# Patient Record
Sex: Female | Born: 1980 | Race: White | Hispanic: No | Marital: Single | Smoking: Former smoker
Health system: Southern US, Community
[De-identification: ages and names within clinical notes are randomized; demographics above are authoritative.]

## PROBLEM LIST (undated history)

## (undated) ENCOUNTER — Emergency Department (HOSPITAL_BASED_OUTPATIENT_CLINIC_OR_DEPARTMENT_OTHER): Admission: EM | Payer: Self-pay | Source: Home / Self Care

## (undated) DIAGNOSIS — R32 Unspecified urinary incontinence: Secondary | ICD-10-CM

## (undated) DIAGNOSIS — G709 Myoneural disorder, unspecified: Secondary | ICD-10-CM

## (undated) DIAGNOSIS — H469 Unspecified optic neuritis: Secondary | ICD-10-CM

## (undated) DIAGNOSIS — R519 Headache, unspecified: Secondary | ICD-10-CM

## (undated) DIAGNOSIS — R51 Headache: Secondary | ICD-10-CM

## (undated) DIAGNOSIS — Z9889 Other specified postprocedural states: Secondary | ICD-10-CM

## (undated) DIAGNOSIS — J40 Bronchitis, not specified as acute or chronic: Secondary | ICD-10-CM

## (undated) DIAGNOSIS — F329 Major depressive disorder, single episode, unspecified: Secondary | ICD-10-CM

## (undated) DIAGNOSIS — F419 Anxiety disorder, unspecified: Secondary | ICD-10-CM

## (undated) DIAGNOSIS — K219 Gastro-esophageal reflux disease without esophagitis: Secondary | ICD-10-CM

## (undated) DIAGNOSIS — R112 Nausea with vomiting, unspecified: Secondary | ICD-10-CM

## (undated) DIAGNOSIS — E282 Polycystic ovarian syndrome: Secondary | ICD-10-CM

## (undated) DIAGNOSIS — F909 Attention-deficit hyperactivity disorder, unspecified type: Secondary | ICD-10-CM

## (undated) DIAGNOSIS — F32A Depression, unspecified: Secondary | ICD-10-CM

## (undated) HISTORY — PX: PLANTAR FASCIA SURGERY: SHX746

## (undated) HISTORY — PX: HERNIA REPAIR: SHX51

## (undated) HISTORY — PX: ABDOMINAL HYSTERECTOMY: SHX81

## (undated) HISTORY — PX: TUBAL LIGATION: SHX77

---
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
## 2002-02-12 ENCOUNTER — Inpatient Hospital Stay (HOSPITAL_COMMUNITY): Admission: AD | Admit: 2002-02-12 | Discharge: 2002-02-12 | Payer: Self-pay | Admitting: Obstetrics and Gynecology

## 2002-03-20 ENCOUNTER — Inpatient Hospital Stay (HOSPITAL_COMMUNITY): Admission: AD | Admit: 2002-03-20 | Discharge: 2002-03-20 | Payer: Self-pay | Admitting: Obstetrics and Gynecology

## 2002-04-11 ENCOUNTER — Inpatient Hospital Stay (HOSPITAL_COMMUNITY): Admission: AD | Admit: 2002-04-11 | Discharge: 2002-04-11 | Payer: Self-pay | Admitting: Obstetrics and Gynecology

## 2002-04-27 ENCOUNTER — Inpatient Hospital Stay (HOSPITAL_COMMUNITY): Admission: AD | Admit: 2002-04-27 | Discharge: 2002-04-27 | Payer: Self-pay | Admitting: Obstetrics and Gynecology

## 2002-05-06 ENCOUNTER — Inpatient Hospital Stay (HOSPITAL_COMMUNITY): Admission: AD | Admit: 2002-05-06 | Discharge: 2002-05-06 | Payer: Self-pay | Admitting: Obstetrics and Gynecology

## 2002-05-12 ENCOUNTER — Inpatient Hospital Stay (HOSPITAL_COMMUNITY): Admission: AD | Admit: 2002-05-12 | Discharge: 2002-05-12 | Payer: Self-pay | Admitting: Obstetrics & Gynecology

## 2002-05-20 ENCOUNTER — Inpatient Hospital Stay (HOSPITAL_COMMUNITY): Admission: AD | Admit: 2002-05-20 | Discharge: 2002-05-20 | Payer: Self-pay | Admitting: Obstetrics and Gynecology

## 2002-05-25 ENCOUNTER — Inpatient Hospital Stay (HOSPITAL_COMMUNITY): Admission: AD | Admit: 2002-05-25 | Discharge: 2002-05-25 | Payer: Self-pay | Admitting: Obstetrics and Gynecology

## 2002-05-26 ENCOUNTER — Inpatient Hospital Stay (HOSPITAL_COMMUNITY): Admission: AD | Admit: 2002-05-26 | Discharge: 2002-05-30 | Payer: Self-pay | Admitting: Obstetrics and Gynecology

## 2002-06-23 ENCOUNTER — Other Ambulatory Visit: Admission: RE | Admit: 2002-06-23 | Discharge: 2002-06-23 | Payer: Self-pay | Admitting: Obstetrics and Gynecology

## 2002-09-16 ENCOUNTER — Emergency Department (HOSPITAL_COMMUNITY): Admission: EM | Admit: 2002-09-16 | Discharge: 2002-09-16 | Payer: Self-pay | Admitting: Emergency Medicine

## 2002-09-18 ENCOUNTER — Emergency Department (HOSPITAL_COMMUNITY): Admission: EM | Admit: 2002-09-18 | Discharge: 2002-09-18 | Payer: Self-pay | Admitting: Emergency Medicine

## 2003-01-06 ENCOUNTER — Inpatient Hospital Stay (HOSPITAL_COMMUNITY): Admission: AD | Admit: 2003-01-06 | Discharge: 2003-01-06 | Payer: Self-pay | Admitting: *Deleted

## 2003-01-06 ENCOUNTER — Encounter: Payer: Self-pay | Admitting: *Deleted

## 2003-01-27 ENCOUNTER — Encounter: Admission: RE | Admit: 2003-01-27 | Discharge: 2003-01-27 | Payer: Self-pay | Admitting: Obstetrics and Gynecology

## 2003-03-30 ENCOUNTER — Inpatient Hospital Stay (HOSPITAL_COMMUNITY): Admission: EM | Admit: 2003-03-30 | Discharge: 2003-03-31 | Payer: Self-pay

## 2003-03-30 ENCOUNTER — Encounter: Payer: Self-pay | Admitting: Emergency Medicine

## 2003-06-06 ENCOUNTER — Emergency Department (HOSPITAL_COMMUNITY): Admission: EM | Admit: 2003-06-06 | Discharge: 2003-06-06 | Payer: Self-pay | Admitting: Emergency Medicine

## 2003-08-18 ENCOUNTER — Emergency Department (HOSPITAL_COMMUNITY): Admission: EM | Admit: 2003-08-18 | Discharge: 2003-08-18 | Payer: Self-pay | Admitting: Emergency Medicine

## 2003-09-28 ENCOUNTER — Emergency Department (HOSPITAL_COMMUNITY): Admission: EM | Admit: 2003-09-28 | Discharge: 2003-09-28 | Payer: Self-pay | Admitting: Emergency Medicine

## 2003-10-10 ENCOUNTER — Other Ambulatory Visit: Admission: RE | Admit: 2003-10-10 | Discharge: 2003-10-10 | Payer: Self-pay | Admitting: Obstetrics and Gynecology

## 2003-11-17 ENCOUNTER — Inpatient Hospital Stay (HOSPITAL_COMMUNITY): Admission: AD | Admit: 2003-11-17 | Discharge: 2003-11-17 | Payer: Self-pay | Admitting: *Deleted

## 2004-01-16 ENCOUNTER — Inpatient Hospital Stay (HOSPITAL_COMMUNITY): Admission: AD | Admit: 2004-01-16 | Discharge: 2004-01-16 | Payer: Self-pay | Admitting: Obstetrics and Gynecology

## 2004-03-11 ENCOUNTER — Inpatient Hospital Stay (HOSPITAL_COMMUNITY): Admission: AD | Admit: 2004-03-11 | Discharge: 2004-03-12 | Payer: Self-pay | Admitting: Obstetrics and Gynecology

## 2004-03-31 ENCOUNTER — Inpatient Hospital Stay (HOSPITAL_COMMUNITY): Admission: AD | Admit: 2004-03-31 | Discharge: 2004-03-31 | Payer: Self-pay | Admitting: Obstetrics and Gynecology

## 2004-04-02 ENCOUNTER — Inpatient Hospital Stay (HOSPITAL_COMMUNITY): Admission: AD | Admit: 2004-04-02 | Discharge: 2004-04-03 | Payer: Self-pay | Admitting: Obstetrics & Gynecology

## 2004-04-07 ENCOUNTER — Inpatient Hospital Stay (HOSPITAL_COMMUNITY): Admission: AD | Admit: 2004-04-07 | Discharge: 2004-04-07 | Payer: Self-pay | Admitting: Obstetrics & Gynecology

## 2004-05-01 ENCOUNTER — Inpatient Hospital Stay (HOSPITAL_COMMUNITY): Admission: AD | Admit: 2004-05-01 | Discharge: 2004-05-01 | Payer: Self-pay | Admitting: Obstetrics and Gynecology

## 2004-05-06 ENCOUNTER — Inpatient Hospital Stay (HOSPITAL_COMMUNITY): Admission: AD | Admit: 2004-05-06 | Discharge: 2004-05-06 | Payer: Self-pay | Admitting: Obstetrics and Gynecology

## 2004-05-14 ENCOUNTER — Inpatient Hospital Stay (HOSPITAL_COMMUNITY): Admission: AD | Admit: 2004-05-14 | Discharge: 2004-05-14 | Payer: Self-pay | Admitting: Obstetrics and Gynecology

## 2004-05-15 ENCOUNTER — Inpatient Hospital Stay (HOSPITAL_COMMUNITY): Admission: AD | Admit: 2004-05-15 | Discharge: 2004-05-15 | Payer: Self-pay | Admitting: Obstetrics and Gynecology

## 2004-05-16 ENCOUNTER — Inpatient Hospital Stay (HOSPITAL_COMMUNITY): Admission: AD | Admit: 2004-05-16 | Discharge: 2004-05-16 | Payer: Self-pay | Admitting: Obstetrics and Gynecology

## 2004-05-17 ENCOUNTER — Inpatient Hospital Stay (HOSPITAL_COMMUNITY): Admission: AD | Admit: 2004-05-17 | Discharge: 2004-05-17 | Payer: Self-pay | Admitting: Obstetrics & Gynecology

## 2004-05-20 ENCOUNTER — Inpatient Hospital Stay (HOSPITAL_COMMUNITY): Admission: AD | Admit: 2004-05-20 | Discharge: 2004-05-20 | Payer: Self-pay | Admitting: Obstetrics & Gynecology

## 2004-05-28 ENCOUNTER — Inpatient Hospital Stay (HOSPITAL_COMMUNITY): Admission: AD | Admit: 2004-05-28 | Discharge: 2004-05-30 | Payer: Self-pay | Admitting: Obstetrics and Gynecology

## 2004-06-26 ENCOUNTER — Ambulatory Visit (HOSPITAL_COMMUNITY): Admission: RE | Admit: 2004-06-26 | Discharge: 2004-06-26 | Payer: Self-pay | Admitting: Obstetrics and Gynecology

## 2004-12-02 ENCOUNTER — Emergency Department (HOSPITAL_COMMUNITY): Admission: EM | Admit: 2004-12-02 | Discharge: 2004-12-02 | Payer: Self-pay | Admitting: Emergency Medicine

## 2005-02-07 ENCOUNTER — Emergency Department (HOSPITAL_COMMUNITY): Admission: EM | Admit: 2005-02-07 | Discharge: 2005-02-08 | Payer: Self-pay | Admitting: Emergency Medicine

## 2005-06-21 ENCOUNTER — Emergency Department (HOSPITAL_COMMUNITY): Admission: EM | Admit: 2005-06-21 | Discharge: 2005-06-21 | Payer: Self-pay | Admitting: Emergency Medicine

## 2005-07-17 ENCOUNTER — Emergency Department (HOSPITAL_COMMUNITY): Admission: EM | Admit: 2005-07-17 | Discharge: 2005-07-17 | Payer: Self-pay | Admitting: Emergency Medicine

## 2005-09-10 ENCOUNTER — Emergency Department (HOSPITAL_COMMUNITY): Admission: EM | Admit: 2005-09-10 | Discharge: 2005-09-10 | Payer: Self-pay | Admitting: Emergency Medicine

## 2005-09-14 ENCOUNTER — Emergency Department (HOSPITAL_COMMUNITY): Admission: EM | Admit: 2005-09-14 | Discharge: 2005-09-14 | Payer: Self-pay | Admitting: Emergency Medicine

## 2005-10-22 ENCOUNTER — Emergency Department (HOSPITAL_COMMUNITY): Admission: EM | Admit: 2005-10-22 | Discharge: 2005-10-22 | Payer: Self-pay | Admitting: Emergency Medicine

## 2006-04-14 ENCOUNTER — Emergency Department (HOSPITAL_COMMUNITY): Admission: EM | Admit: 2006-04-14 | Discharge: 2006-04-14 | Payer: Self-pay | Admitting: Emergency Medicine

## 2006-08-10 ENCOUNTER — Emergency Department (HOSPITAL_COMMUNITY): Admission: EM | Admit: 2006-08-10 | Discharge: 2006-08-10 | Payer: Self-pay | Admitting: Emergency Medicine

## 2006-09-04 ENCOUNTER — Emergency Department (HOSPITAL_COMMUNITY): Admission: EM | Admit: 2006-09-04 | Discharge: 2006-09-04 | Payer: Self-pay | Admitting: Emergency Medicine

## 2006-09-05 ENCOUNTER — Ambulatory Visit (HOSPITAL_COMMUNITY): Admission: RE | Admit: 2006-09-05 | Discharge: 2006-09-06 | Payer: Self-pay | Admitting: General Surgery

## 2007-05-16 ENCOUNTER — Emergency Department (HOSPITAL_COMMUNITY): Admission: EM | Admit: 2007-05-16 | Discharge: 2007-05-16 | Payer: Self-pay | Admitting: Emergency Medicine

## 2007-08-16 ENCOUNTER — Emergency Department (HOSPITAL_COMMUNITY): Admission: EM | Admit: 2007-08-16 | Discharge: 2007-08-16 | Payer: Self-pay | Admitting: Emergency Medicine

## 2007-09-27 ENCOUNTER — Emergency Department (HOSPITAL_COMMUNITY): Admission: EM | Admit: 2007-09-27 | Discharge: 2007-09-27 | Payer: Self-pay | Admitting: Emergency Medicine

## 2007-10-20 ENCOUNTER — Observation Stay (HOSPITAL_COMMUNITY): Admission: EM | Admit: 2007-10-20 | Discharge: 2007-10-21 | Payer: Self-pay | Admitting: Emergency Medicine

## 2007-10-25 ENCOUNTER — Emergency Department (HOSPITAL_COMMUNITY): Admission: EM | Admit: 2007-10-25 | Discharge: 2007-10-25 | Payer: Self-pay | Admitting: Emergency Medicine

## 2007-11-28 ENCOUNTER — Emergency Department (HOSPITAL_COMMUNITY): Admission: EM | Admit: 2007-11-28 | Discharge: 2007-11-28 | Payer: Self-pay | Admitting: Emergency Medicine

## 2008-01-24 ENCOUNTER — Emergency Department (HOSPITAL_COMMUNITY): Admission: EM | Admit: 2008-01-24 | Discharge: 2008-01-24 | Payer: Self-pay | Admitting: Emergency Medicine

## 2008-03-13 ENCOUNTER — Emergency Department (HOSPITAL_COMMUNITY): Admission: EM | Admit: 2008-03-13 | Discharge: 2008-03-13 | Payer: Self-pay | Admitting: Emergency Medicine

## 2008-03-27 ENCOUNTER — Emergency Department (HOSPITAL_COMMUNITY): Admission: EM | Admit: 2008-03-27 | Discharge: 2008-03-27 | Payer: Self-pay | Admitting: Emergency Medicine

## 2008-04-29 ENCOUNTER — Emergency Department (HOSPITAL_BASED_OUTPATIENT_CLINIC_OR_DEPARTMENT_OTHER): Admission: EM | Admit: 2008-04-29 | Discharge: 2008-04-29 | Payer: Self-pay | Admitting: Emergency Medicine

## 2008-05-29 ENCOUNTER — Emergency Department (HOSPITAL_COMMUNITY): Admission: EM | Admit: 2008-05-29 | Discharge: 2008-05-29 | Payer: Self-pay | Admitting: Emergency Medicine

## 2008-06-22 ENCOUNTER — Ambulatory Visit (HOSPITAL_COMMUNITY): Admission: RE | Admit: 2008-06-22 | Discharge: 2008-06-22 | Payer: Self-pay | Admitting: Physician Assistant

## 2008-06-24 ENCOUNTER — Emergency Department (HOSPITAL_BASED_OUTPATIENT_CLINIC_OR_DEPARTMENT_OTHER): Admission: EM | Admit: 2008-06-24 | Discharge: 2008-06-24 | Payer: Self-pay | Admitting: Emergency Medicine

## 2008-06-29 ENCOUNTER — Inpatient Hospital Stay (HOSPITAL_COMMUNITY): Admission: AD | Admit: 2008-06-29 | Discharge: 2008-06-29 | Payer: Self-pay | Admitting: Obstetrics and Gynecology

## 2008-07-01 ENCOUNTER — Ambulatory Visit: Admission: RE | Admit: 2008-07-01 | Discharge: 2008-07-01 | Payer: Self-pay | Admitting: Obstetrics and Gynecology

## 2008-07-25 ENCOUNTER — Emergency Department (HOSPITAL_BASED_OUTPATIENT_CLINIC_OR_DEPARTMENT_OTHER): Admission: EM | Admit: 2008-07-25 | Discharge: 2008-07-25 | Payer: Self-pay | Admitting: Emergency Medicine

## 2008-08-05 ENCOUNTER — Inpatient Hospital Stay (HOSPITAL_COMMUNITY): Admission: AD | Admit: 2008-08-05 | Discharge: 2008-08-05 | Payer: Self-pay | Admitting: Obstetrics and Gynecology

## 2008-09-24 ENCOUNTER — Emergency Department (HOSPITAL_BASED_OUTPATIENT_CLINIC_OR_DEPARTMENT_OTHER): Admission: EM | Admit: 2008-09-24 | Discharge: 2008-09-24 | Payer: Self-pay | Admitting: Emergency Medicine

## 2008-12-26 ENCOUNTER — Ambulatory Visit: Payer: Self-pay | Admitting: Radiology

## 2008-12-26 ENCOUNTER — Emergency Department (HOSPITAL_BASED_OUTPATIENT_CLINIC_OR_DEPARTMENT_OTHER): Admission: EM | Admit: 2008-12-26 | Discharge: 2008-12-26 | Payer: Self-pay | Admitting: Emergency Medicine

## 2009-01-13 ENCOUNTER — Emergency Department (HOSPITAL_COMMUNITY): Admission: EM | Admit: 2009-01-13 | Discharge: 2009-01-13 | Payer: Self-pay | Admitting: Emergency Medicine

## 2009-01-16 ENCOUNTER — Emergency Department (HOSPITAL_COMMUNITY): Admission: EM | Admit: 2009-01-16 | Discharge: 2009-01-16 | Payer: Self-pay | Admitting: Emergency Medicine

## 2009-03-22 ENCOUNTER — Emergency Department (HOSPITAL_COMMUNITY): Admission: EM | Admit: 2009-03-22 | Discharge: 2009-03-22 | Payer: Self-pay | Admitting: Emergency Medicine

## 2009-04-13 ENCOUNTER — Emergency Department (HOSPITAL_COMMUNITY): Admission: EM | Admit: 2009-04-13 | Discharge: 2009-04-13 | Payer: Self-pay | Admitting: Emergency Medicine

## 2009-05-22 ENCOUNTER — Emergency Department (HOSPITAL_BASED_OUTPATIENT_CLINIC_OR_DEPARTMENT_OTHER): Admission: EM | Admit: 2009-05-22 | Discharge: 2009-05-22 | Payer: Self-pay | Admitting: Emergency Medicine

## 2009-07-18 ENCOUNTER — Emergency Department (HOSPITAL_BASED_OUTPATIENT_CLINIC_OR_DEPARTMENT_OTHER): Admission: EM | Admit: 2009-07-18 | Discharge: 2009-07-18 | Payer: Self-pay | Admitting: Emergency Medicine

## 2009-07-18 ENCOUNTER — Ambulatory Visit: Payer: Self-pay | Admitting: Diagnostic Radiology

## 2009-07-31 ENCOUNTER — Emergency Department (HOSPITAL_BASED_OUTPATIENT_CLINIC_OR_DEPARTMENT_OTHER): Admission: EM | Admit: 2009-07-31 | Discharge: 2009-07-31 | Payer: Self-pay | Admitting: Emergency Medicine

## 2009-09-02 ENCOUNTER — Emergency Department (HOSPITAL_BASED_OUTPATIENT_CLINIC_OR_DEPARTMENT_OTHER): Admission: EM | Admit: 2009-09-02 | Discharge: 2009-09-02 | Payer: Self-pay | Admitting: Emergency Medicine

## 2009-10-25 ENCOUNTER — Emergency Department (HOSPITAL_BASED_OUTPATIENT_CLINIC_OR_DEPARTMENT_OTHER): Admission: EM | Admit: 2009-10-25 | Discharge: 2009-10-25 | Payer: Self-pay | Admitting: Emergency Medicine

## 2009-11-09 ENCOUNTER — Emergency Department (HOSPITAL_BASED_OUTPATIENT_CLINIC_OR_DEPARTMENT_OTHER): Admission: EM | Admit: 2009-11-09 | Discharge: 2009-11-09 | Payer: Self-pay | Admitting: Emergency Medicine

## 2009-11-09 ENCOUNTER — Ambulatory Visit: Payer: Self-pay | Admitting: Radiology

## 2010-04-16 ENCOUNTER — Ambulatory Visit: Payer: Self-pay | Admitting: Interventional Radiology

## 2010-04-16 ENCOUNTER — Emergency Department (HOSPITAL_BASED_OUTPATIENT_CLINIC_OR_DEPARTMENT_OTHER): Admission: EM | Admit: 2010-04-16 | Discharge: 2010-04-16 | Payer: Self-pay | Admitting: Emergency Medicine

## 2010-07-17 ENCOUNTER — Emergency Department (HOSPITAL_COMMUNITY): Admission: EM | Admit: 2010-07-17 | Discharge: 2010-07-17 | Payer: Self-pay | Admitting: Emergency Medicine

## 2010-08-16 ENCOUNTER — Inpatient Hospital Stay (HOSPITAL_COMMUNITY)
Admission: AD | Admit: 2010-08-16 | Discharge: 2010-08-16 | Payer: Self-pay | Source: Home / Self Care | Attending: Obstetrics & Gynecology | Admitting: Obstetrics & Gynecology

## 2010-08-17 ENCOUNTER — Encounter
Admission: RE | Admit: 2010-08-17 | Discharge: 2010-08-17 | Payer: Self-pay | Source: Home / Self Care | Attending: Obstetrics & Gynecology | Admitting: Obstetrics & Gynecology

## 2010-09-03 ENCOUNTER — Emergency Department (HOSPITAL_BASED_OUTPATIENT_CLINIC_OR_DEPARTMENT_OTHER)
Admission: EM | Admit: 2010-09-03 | Discharge: 2010-09-03 | Payer: Self-pay | Source: Home / Self Care | Admitting: Emergency Medicine

## 2010-09-09 ENCOUNTER — Emergency Department (HOSPITAL_BASED_OUTPATIENT_CLINIC_OR_DEPARTMENT_OTHER)
Admission: EM | Admit: 2010-09-09 | Discharge: 2010-09-09 | Payer: Self-pay | Source: Home / Self Care | Admitting: Emergency Medicine

## 2010-09-23 ENCOUNTER — Encounter: Payer: Self-pay | Admitting: General Surgery

## 2010-10-17 ENCOUNTER — Emergency Department (HOSPITAL_BASED_OUTPATIENT_CLINIC_OR_DEPARTMENT_OTHER)
Admission: EM | Admit: 2010-10-17 | Discharge: 2010-10-17 | Disposition: A | Discharge status: Home / Self Care | Payer: Self-pay | Attending: Emergency Medicine | Admitting: Emergency Medicine

## 2010-10-17 DIAGNOSIS — J029 Acute pharyngitis, unspecified: Secondary | ICD-10-CM | POA: Insufficient documentation

## 2010-10-17 DIAGNOSIS — J069 Acute upper respiratory infection, unspecified: Secondary | ICD-10-CM | POA: Insufficient documentation

## 2010-11-16 LAB — URINE MICROSCOPIC-ADD ON

## 2010-11-16 LAB — URINALYSIS, ROUTINE W REFLEX MICROSCOPIC
Bilirubin Urine: NEGATIVE
Glucose, UA: NEGATIVE mg/dL
Hgb urine dipstick: NEGATIVE
Ketones, ur: NEGATIVE mg/dL
Specific Gravity, Urine: 1.022 (ref 1.005–1.030)
pH: 8.5 — ABNORMAL HIGH (ref 5.0–8.0)

## 2010-11-16 LAB — URINE CULTURE: Culture  Setup Time: 201108151959

## 2010-11-21 LAB — URINALYSIS, ROUTINE W REFLEX MICROSCOPIC
Bilirubin Urine: NEGATIVE
Hgb urine dipstick: NEGATIVE
Protein, ur: NEGATIVE mg/dL
Urobilinogen, UA: 0.2 mg/dL (ref 0.0–1.0)

## 2010-11-21 LAB — PREGNANCY, URINE: Preg Test, Ur: NEGATIVE

## 2010-11-21 LAB — URINE MICROSCOPIC-ADD ON

## 2010-12-07 LAB — URINALYSIS, ROUTINE W REFLEX MICROSCOPIC
Glucose, UA: NEGATIVE mg/dL
Ketones, ur: NEGATIVE mg/dL
Specific Gravity, Urine: 1.026 (ref 1.005–1.030)
pH: 8 (ref 5.0–8.0)

## 2010-12-07 LAB — GC/CHLAMYDIA PROBE AMP, GENITAL
Chlamydia, DNA Probe: NEGATIVE
GC Probe Amp, Genital: NEGATIVE

## 2010-12-07 LAB — WET PREP, GENITAL
Trich, Wet Prep: NONE SEEN
Yeast Wet Prep HPF POC: NONE SEEN

## 2010-12-09 LAB — URINE MICROSCOPIC-ADD ON

## 2010-12-09 LAB — URINALYSIS, ROUTINE W REFLEX MICROSCOPIC
Nitrite: NEGATIVE
Specific Gravity, Urine: 1.037 — ABNORMAL HIGH (ref 1.005–1.030)
Urobilinogen, UA: 1 mg/dL (ref 0.0–1.0)
pH: 5.5 (ref 5.0–8.0)

## 2010-12-09 LAB — POCT PREGNANCY, URINE: Preg Test, Ur: NEGATIVE

## 2010-12-11 LAB — COMPREHENSIVE METABOLIC PANEL
ALT: 31 U/L (ref 0–35)
ALT: 25 U/L (ref 0–35)
AST: 27 U/L (ref 0–37)
BUN: 11 mg/dL (ref 6–23)
CO2: 27 mEq/L (ref 19–32)
CO2: 28 mEq/L (ref 19–32)
Calcium: 9 mg/dL (ref 8.4–10.5)
Calcium: 9.5 mg/dL (ref 8.4–10.5)
Chloride: 106 mEq/L (ref 96–112)
Creatinine, Ser: 0.68 mg/dL (ref 0.4–1.2)
Creatinine, Ser: 0.78 mg/dL (ref 0.4–1.2)
GFR calc Af Amer: >60 mL/min (ref >60)
GFR calc non Af Amer: >60 mL/min (ref >60)
GFR calc non Af Amer: >60 mL/min (ref >60)
Glucose, Bld: 95 mg/dL (ref 70–99)
Glucose, Bld: 93 mg/dL (ref 70–99)
Total Bilirubin: 0.6 mg/dL (ref 0.3–1.2)
Total Protein: 7.1 g/dL (ref 6.0–8.3)

## 2010-12-11 LAB — URINALYSIS, ROUTINE W REFLEX MICROSCOPIC
Bilirubin Urine: NEGATIVE
Bilirubin Urine: NEGATIVE
Glucose, UA: NEGATIVE mg/dL
Hgb urine dipstick: NEGATIVE
Ketones, ur: NEGATIVE mg/dL
Ketones, ur: NEGATIVE mg/dL
Nitrite: NEGATIVE
Specific Gravity, Urine: 1.011 (ref 1.005–1.030)
Urobilinogen, UA: 0.2 mg/dL (ref 0.0–1.0)
pH: 6.5 (ref 5.0–8.0)
pH: 5.5 (ref 5.0–8.0)

## 2010-12-11 LAB — CBC
HCT: 41.2 % (ref 36.0–46.0)
Hemoglobin: 12.9 g/dL (ref 12.0–15.0)
Hemoglobin: 14 g/dL (ref 12.0–15.0)
MCHC: 33.9 g/dL (ref 30.0–36.0)
MCHC: 34.1 g/dL (ref 30.0–36.0)
MCV: 85.5 fL (ref 78.0–100.0)
MCV: 86.3 fL (ref 78.0–100.0)
RBC: 4.47 MIL/uL (ref 3.87–5.11)
RBC: 4.77 MIL/uL (ref 3.87–5.11)
RDW: 12.8 % (ref 11.5–15.5)
WBC: 5.8 10*3/uL (ref 4.0–10.5)

## 2010-12-11 LAB — LIPASE, BLOOD
Lipase: 23 U/L (ref 11–59)
Lipase: 20 U/L (ref 11–59)

## 2010-12-11 LAB — DIFFERENTIAL
Basophils Absolute: 0 10*3/uL (ref 0.0–0.1)
Eosinophils Absolute: 0.2 10*3/uL (ref 0.0–0.7)
Eosinophils Absolute: 0.1 10*3/uL (ref 0.0–0.7)
Eosinophils Relative: 4 % (ref 0–5)
Lymphocytes Relative: 32 % (ref 12–46)
Lymphs Abs: 1.8 10*3/uL (ref 0.7–4.0)
Lymphs Abs: 0.9 10*3/uL (ref 0.7–4.0)
Neutro Abs: 6.3 10*3/uL (ref 1.7–7.7)
Neutrophils Relative %: 53 % (ref 43–77)
Neutrophils Relative %: 78 % — ABNORMAL HIGH (ref 43–77)
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
## 2011-01-18 NOTE — Op Note (Signed)
Stacy Deleon, Stacy Deleon              ACCOUNT NO.:  [id number]   MEDICAL RECORD NO.:  [id number]          PATIENT TYPE:  OIB   LOCATION:  2550                         FACILITY:  [facility]   PHYSICIAN:  Sharlet Salina T. Hoxworth, M.D.DATE OF BIRTH:  10-18-1980   DATE OF PROCEDURE:  09/05/2006  DATE OF DISCHARGE:                               OPERATIVE REPORT   PREOPERATIVE DIAGNOSIS:  Supraumbilical hernia.   POSTOPERATIVE DIAGNOSIS:  Supraumbilical hernia.   SURGICAL PROCEDURES:  Repair of supraumbilical hernia with mesh.   SURGEON:  Lorne Skeens. Hoxworth, M.D.   ANESTHESIA:  General.   BRIEF HISTORY:  [name] is a 30 year old female who presents with a  painful bulge just above her umbilicus.  Examination confirms a  partially reducible hernia in this area and CT scan when she with  abdominal pain in the ER has confirmed a hernia with about a 2 cm  fascial defect in the midline above the umbilicus.  Repair using mesh  under general anesthesia has been recommend and accepted.  The nature of  the procedure, indications, risks of bleeding, infection, anesthetic  complications and recurrence have been discussed and understood.  She is  now brought to the operating room for this procedure.   DESCRIPTION OF OPERATION:  The patient was brought to the operating  room, placed in supine position on the operating table, and general  anesthesia was induced.  The abdomen was widely sterilely prepped and  draped.  She received preoperative antibiotics.  Correct patient and  procedure were verified.  An incision was made in the midline just above  the umbilicus over the palpable mass.  Dissection was carried down to  the subcutaneous tissue.  A fairly large hernia sac 4 or 5 cm in  diameter was bluntly dissected from surrounding subcutaneous tissue down  to the level of the fascia.  Following this the fascia was cleared back  for several centimeters in all directions in preparation for repair.  The  defect itself measured about 2 cm.  The sac was opened and contained  no abdominal contents.  There was a lot of retroperitoneal fat  associated with the sac, and the sac was excised between hemostats and  tied with 3-0 Vicryl ties.  The fascial defect was then closed  transversely with several interrupted 0 Prolene sutures.  An onlay mesh  of Parietex was used measuring about 7 x 5 cm and was sutured at its  periphery circumferentially with interrupted 2-0 Prolene.  Following  this the fascia and soft tissue were infiltrated with Marcaine.  Hemostasis was assured.  The subcu was closed with running 3-0 Monocryl  and the skin was closed with running subcuticular 4-0 Monocryl and Steri-  Strips.  Sponge, needle and instrument counts correct.  A dry sterile  dressing was applied and the patient taken to recovery in good  condition.      Lorne Skeens. Hoxworth, M.D.  Electronically Signed    BTH/MEDQ  D:  09/05/2006  T:  09/05/2006  Job:  [id number]
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
## 2011-01-18 NOTE — Discharge Summary (Signed)
NAME:  Stacy Deleon, Stacy Deleon                        ACCOUNT NO.:  [id number]   MEDICAL RECORD NO.:  [id number]                   PATIENT TYPE:  INP   LOCATION:  9147                                 FACILITY:  [facility]   PHYSICIAN:  Gerrit Friends. Aldona Bar, M.D.                DATE OF BIRTH:  1981/05/01   DATE OF ADMISSION:  05/26/2002  DATE OF DISCHARGE:  05/30/2002                                 DISCHARGE SUMMARY   DISCHARGE DIAGNOSES:  1. Term pregnancy delivered, 7 pound 11 ounce female infant, Apgars 9 and 9.  2. Blood type O positive.   PROCEDURES:  Normal spontaneous delivery over intact perineum.   HOSPITAL COURSE:  This is a 30 year old gravida 2, para 1 who presented in  early labor at 37-38 weeks' gestation. She had a normal course of labor with  subsequent normal spontaneous delivery of a viable 7 pound 11 ounce female  infant with good Apgars over an intact perineum. The mother's post partum  course was uncomplicated, however, the baby on the evening of 05/28/02 was  transferred to the neonatal intensive care unit for workup of possible  seizures. According to the mother, this was found to be only resulting from  dehydration; apparently, workup is still in progress, but the baby seems to  be doing well and hopefully will be discharged in 3-5 days. The mother as  mentioned, had a benign post partum course. Her discharge hemoglobin was  10.3 with a white count of 12.6 and a platelet count of 260,000. At the time  of discharge she was attempting to breast feed and store her milk according  to the nursery protocols. She was given all appropriate instructions at the  time of discharge and understood all instructions well.   DISCHARGE MEDICATIONS:  1. Vitamins - one a day.  2. Ferrous sulfate 300 mg daily.  3. She was given a prescription for Motrin 600 mg to use every 6 hours as     needed for discomfort.   DISCHARGE INSTRUCTIONS:  1. She will return to the office for followup in  approximately 4 weeks'     time.  2. She did receive Depo-Provera 150 mg IM for contraception per her request     after informed consent on 05/28/02.   CONDITION ON DISCHARGE:  Improved.                                                Gerrit Friends. Aldona Bar, M.D.    RMW/MEDQ  D:  05/30/2002  T:  05/31/2002  Job:  [phone]

## 2011-01-18 NOTE — Op Note (Signed)
NAMECHARELLE, Stacy Deleon NO.:  [id number]   MEDICAL RECORD NO.:  [id number]          PATIENT TYPE:  AMB   LOCATION:  SDC                           FACILITY:  [facility]   PHYSICIAN:  Carrington Clamp, M.D. DATE OF BIRTH:  31-Jan-1981   DATE OF PROCEDURE:  06/26/2004  DATE OF DISCHARGE:                                 OPERATIVE REPORT   PREOPERATIVE DIAGNOSIS:  Multiparous, desires permanent sterility.   POSTOPERATIVE DIAGNOSIS:  Multiparous, desires permanent sterility.   PROCEDURE:  Filshie clip bilateral tubal ligation with laparoscope.   ATTENDING:  Carrington Clamp, M.D.   ANESTHESIA:  General endotracheal anesthesia.   ESTIMATED BLOOD LOSS:  Minimal.   FLUIDS REPLACED:  1300 mL.   URINE OUTPUT:  Not measured.   COMPLICATIONS:  None.   FINDINGS:  A normal uterus, ovaries, and tubes.   MEDICATIONS:  Marcaine 0.25% to incision.   COUNTS:  Correct x3.   PATHOLOGY:  None.   TECHNIQUE:  After adequate general anesthesia was achieved, the patient was  prepped and draped in the usual sterile fashion in the dorsal lithotomy  position.  A speculum was placed in the vagina and a uterine manipulator  placed into the cervix without complication.  The red rubber catheter was  used to empty the bladder during the procedure.  The speculum was removed.  Attention was then turned to the abdomen, where a 1.5 cm infraumbilical  incision was made transversely.  The Veress needle was passed several times,  and each time there was no aspiration of bowel contents and blood.  The  final time the pressure without rechecking the belly was at 8 mmH2O and the  abdomen was insufflated.  The 10 mm trocar had been passed into the abdomen  after the Veress needle had been introduced.  On putting the scope inside  the belly, inspection of the bowel was undertaken and found to be normal.  The tubes were identified and each tube was grasped with the Filshie clip  and then the Filshie clip  placed around the entirety of the tube.  Pictures  were taken to indicate the Filshie clip was around the entirety of the tube  on both sides.  All instruments were then withdrawn and the abdomen  desufflated.  The trocar was removed and the 10 mm incision  was then closed with a figure-of-eight stitch of 2-0 Vicryl.  The skin was  closed with Dermabond.  All instruments were withdrawn from the vagina, and  the patient tolerated the procedure well, was returned to the recovery room  in stable condition.      MH/MEDQ  D:  06/26/2004  T:  06/26/2004  Job:  [id number]

## 2011-03-15 ENCOUNTER — Emergency Department (HOSPITAL_BASED_OUTPATIENT_CLINIC_OR_DEPARTMENT_OTHER)
Admission: EM | Admit: 2011-03-15 | Discharge: 2011-03-15 | Disposition: A | Discharge status: Home / Self Care | Payer: Medicaid Other | Attending: Emergency Medicine | Admitting: Emergency Medicine

## 2011-03-15 ENCOUNTER — Encounter: Payer: Self-pay | Admitting: *Deleted

## 2011-03-15 DIAGNOSIS — H6690 Otitis media, unspecified, unspecified ear: Secondary | ICD-10-CM | POA: Insufficient documentation

## 2011-03-15 DIAGNOSIS — J029 Acute pharyngitis, unspecified: Secondary | ICD-10-CM | POA: Insufficient documentation

## 2011-03-15 DIAGNOSIS — H9209 Otalgia, unspecified ear: Secondary | ICD-10-CM | POA: Insufficient documentation

## 2011-03-15 LAB — RAPID STREP SCREEN (MED CTR MEBANE ONLY): Streptococcus, Group A Screen (Direct): POSITIVE — AB

## 2011-03-15 MED ORDER — AMOXICILLIN 500 MG PO CAPS: 500.0000 mg | ORAL_CAPSULE | Freq: Three times a day (TID) | ORAL | Status: AC

## 2011-03-15 NOTE — ED Provider Notes (Signed)
History     Chief Complaint  Patient presents with  . Sore Throat   HPI Comments: Pt has a 3 day history of sore throat, and has a history of strep throat,  And also has left ear pain.   Patient is a 30 y.o. female presenting with pharyngitis.  Sore Throat This is a new problem. The current episode started more than 2 days ago. The problem occurs constantly. The problem has not changed since onset.The symptoms are aggravated by nothing. The symptoms are relieved by nothing. She has tried nothing for the symptoms.    History reviewed. No pertinent past medical history.  History reviewed. No pertinent past surgical history.  History reviewed. No pertinent family history.  History  Substance Use Topics  . Smoking status: Never Smoker   . Smokeless tobacco: Not on file  . Alcohol Use: No    OB History    Grav Para Term Preterm Abortions TAB SAB Ect Mult Living                  Review of Systems  Constitutional: Positive for chills. Negative for fever.  HENT: Positive for sore throat.        Left earache.  Respiratory: Negative.   Cardiovascular: Negative.   Gastrointestinal: Negative.   Genitourinary: Negative.   Musculoskeletal: Negative.   Skin: Negative.   Neurological: Negative.   Psychiatric/Behavioral: Negative.     Physical Exam  BP 104/65  Pulse 88  Temp(Src) 98.1 F (36.7 C) (Oral)  Resp 18  Ht 5\' 5"  (1.651 m)  Wt 218 lb (98.884 kg)  BMI 36.28 kg/m2  SpO2 98%  Physical Exam  Constitutional: She is oriented to person, place, and time. She appears well-developed and well-nourished.  HENT:  Head: Normocephalic and atraumatic. Not macrocephalic.  Right Ear: Tympanic membrane is not injected.  Left Ear: Tympanic membrane is injected.  Nose: Nose normal.  Mouth/Throat: Uvula is midline. Posterior oropharyngeal erythema present. No tonsillar abscesses.  Eyes: Conjunctivae and EOM are normal. Pupils are equal, round, and reactive to light.    Cardiovascular: Normal rate, regular rhythm and normal heart sounds.   Pulmonary/Chest: Effort normal and breath sounds normal.  Abdominal: Soft. Bowel sounds are normal.  Musculoskeletal: Normal range of motion.  Lymphadenopathy:    She has no cervical adenopathy.  Neurological: She is alert and oriented to person, place, and time.  Skin: Skin is warm and dry.  Psychiatric: She has a normal mood and affect. Her behavior is normal.    ED Course  Procedures  MDM   Pt was treated with amoxicillin 500 mg tid x 10 days for left otitis media.  Rx for metformin to treat her PCOS was written.      Carleene Cooper III, MD 03/17/11 [phone]

## 2011-03-15 NOTE — ED Notes (Signed)
Pt reports sore throat with fevers x Tuesday, has 3 small children at home.  Pt states she frequently get strep, step test obtained while pt being triaged, sent to lab for testing.

## 2011-05-20 ENCOUNTER — Other Ambulatory Visit: Payer: Self-pay

## 2011-05-20 ENCOUNTER — Emergency Department (HOSPITAL_BASED_OUTPATIENT_CLINIC_OR_DEPARTMENT_OTHER)
Admission: EM | Admit: 2011-05-20 | Discharge: 2011-05-20 | Disposition: A | Discharge status: Home / Self Care | Payer: Medicaid Other | Attending: Emergency Medicine | Admitting: Emergency Medicine

## 2011-05-20 ENCOUNTER — Emergency Department (INDEPENDENT_AMBULATORY_CARE_PROVIDER_SITE_OTHER): Payer: Medicaid Other

## 2011-05-20 ENCOUNTER — Encounter (HOSPITAL_BASED_OUTPATIENT_CLINIC_OR_DEPARTMENT_OTHER): Payer: Self-pay | Admitting: Family Medicine

## 2011-05-20 DIAGNOSIS — R0789 Other chest pain: Secondary | ICD-10-CM

## 2011-05-20 DIAGNOSIS — J40 Bronchitis, not specified as acute or chronic: Secondary | ICD-10-CM | POA: Insufficient documentation

## 2011-05-20 DIAGNOSIS — R0602 Shortness of breath: Secondary | ICD-10-CM | POA: Insufficient documentation

## 2011-05-20 DIAGNOSIS — R05 Cough: Secondary | ICD-10-CM

## 2011-05-20 HISTORY — DX: Bronchitis, not specified as acute or chronic: J40

## 2011-05-20 HISTORY — DX: Polycystic ovarian syndrome: E28.2

## 2011-05-20 MED ORDER — ALBUTEROL SULFATE HFA 108 (90 BASE) MCG/ACT IN AERS: 2.0000 | INHALATION_SPRAY | RESPIRATORY_TRACT | Status: DC | PRN

## 2011-05-20 MED ORDER — AZITHROMYCIN 250 MG PO TABS: 250.0000 mg | ORAL_TABLET | Freq: Every day | ORAL | Status: AC

## 2011-05-20 NOTE — ED Notes (Signed)
Pt c/o shortness of breath, tightness in chest and back and "a lot of drainage in my throat". Pt sts "I usually get bronchitis every year". Pt able to speak in full sentences, pulse ox 100%.

## 2011-05-20 NOTE — ED Notes (Signed)
Reports cough as dry and sometimes wet and productive.

## 2011-05-20 NOTE — ED Provider Notes (Addendum)
History     CSN: [id number] Arrival date & time: 05/20/2011  5:00 PM Scribed for Glynn Octave, MD, the patient was seen in room MH10/MH10. This chart was scribed by Katha Cabal. This patient's care was started at 5:00PM.     Chief Complaint  Patient presents with  . Shortness of Breath    HPI   Stacy Deleon is a 30 y.o. female who presents to the Emergency Department complaining of gradual worsening of intermittent SOB with associated productive cough, rhinorrhea, post nasal drip, chest tightness, upper back pain and vomiting (x1) that began 3 days ago.  Patient states that she wheezes while resting and that her SOB is aggravated by lying flat.  Denies fever, abdominal pain, rash, recent sick contacts, and facial pain.  Patient has a hx of Bronchitis.  Patient is not currently being treated for PCOS.  Denies having DM.   Patient adds that her son has CF.   No PCP     PAST MEDICAL HISTORY:  Past Medical History  Diagnosis Date  . Bronchitis   . Polycystic ovary syndrome     PAST SURGICAL HISTORY:  Past Surgical History  Procedure Date  . Tubal ligation     FAMILY HISTORY:  History reviewed. No pertinent family history.   SOCIAL HISTORY: History   Social History  . Marital Status: Single    Spouse Name: N/A    Number of Children: N/A  . Years of Education: N/A   Social History Main Topics  . Smoking status: Never Smoker   . Smokeless tobacco: None  . Alcohol Use: No  . Drug Use: No  . Sexually Active:    Other Topics Concern  . None   Social History Narrative  . None    Review of Systems A 10 review of systems reviewed and are negative for acute change except as noted in the HPI.  Allergies  Hydrocodone  Home Medications   Current Outpatient Rx  Name Route Sig Dispense Refill  . METFORMIN HCL 500 MG PO TABS Oral Take 500 mg by mouth 2 (two) times daily with a meal.     . OMEGA-3 FATTY ACIDS 1000 MG PO CAPS Oral Take 2 g by mouth daily. Hold  while in hospital     . MULTIVITAMINS PO TABS Oral Take 1 tablet by mouth daily.        Physical Exam    BP 108/61  Pulse 66  Temp(Src) 97.8 F (36.6 C) (Oral)  Resp 16  Ht 5\' 5"  (1.651 m)  Wt 236 lb (107.049 kg)  BMI 39.27 kg/m2  SpO2 100%  LMP 04/12/2011  Physical Exam  Nursing note and vitals reviewed. Constitutional: She is oriented to person, place, and time. She appears well-developed and well-nourished. No distress.  HENT:  Head: Normocephalic and atraumatic.  Right Ear: External ear normal.  Left Ear: External ear normal.  Mouth/Throat: Oropharynx is clear and moist.  Eyes: Pupils are equal, round, and reactive to light.  Neck: Neck supple.  Cardiovascular: Normal rate, regular rhythm and normal heart sounds.   No murmur heard. Pulmonary/Chest: Effort normal and breath sounds normal. No respiratory distress. She has no wheezes. She has no rales.  Abdominal: Soft. There is no tenderness. There is no rebound and no guarding.  Musculoskeletal: Normal range of motion.  Lymphadenopathy:    She has no cervical adenopathy.  Neurological: She is alert and oriented to person, place, and time.  Skin: Skin is warm and dry. No  rash noted.  Psychiatric: She has a normal mood and affect. Her behavior is normal.    ED Course  Procedures    OTHER DATA REVIEWED: Nursing notes, vital signs, and past medical records reviewed.    DIAGNOSTIC STUDIES: Oxygen Saturation is 100% on room air, normal by my interpretation.     Date: 05/20/2011  Rate: 71  Rhythm: normal sinus rhythm  QRS Axis: normal  Intervals: normal  ST/T Wave abnormalities: normal  Conduction Disutrbances:none  Narrative Interpretation:   Old EKG Reviewed: unchanged  Dg Chest 2 View  05/20/2011  *RADIOLOGY REPORT*  Clinical Data: Shortness of breath with cough and chest tightness.  CHEST - 2 VIEW  Comparison: 09/09/2010  Findings: The lungs are clear without focal consolidation, edema, effusion or  pneumothorax.  Cardiopericardial silhouette is within normal limits for size.  Imaged bony structures of the thorax are intact.  IMPRESSION: Normal exam.  Original Report Authenticated By: ERIC A. MANSELL, M.D.     ED COURSE / COORDINATION OF CARE:  Orders Placed This Encounter  Procedures  . DG Chest 2 View  . ED EKG    MDM: Chest tightness, rhinorrhea, postnasal drip. No fever no chest pain, difficulty breathing but does have dry cough. Former smoker. No focality on lung exam. PERC negative, not on birth control.   IMPRESSION: Diagnoses that have been ruled out:  Diagnoses that are still under consideration:  Final diagnoses:     MEDICATIONS GIVEN IN THE E.D. Scheduled Meds:   Continuous Infusions:      DISCHARGE MEDICATIONS: New Prescriptions   No medications on file  I personally performed the services described in this documentation, which was scribed in my presence.  The recorded information has been reviewed and considered.          Glynn Octave, MD 05/20/11 1743  Glynn Octave, MD 05/31/11 4401

## 2011-05-24 LAB — CBC
HCT: 37.3
Hemoglobin: 13.7
MCHC: 34.6
MCHC: 34.7
Platelets: 299
Platelets: 330
RDW: 12.6
RDW: 12.7
RDW: 12.9

## 2011-05-24 LAB — BASIC METABOLIC PANEL
Chloride: 97
GFR calc Af Amer: >60
GFR calc non Af Amer: >60
Potassium: 3.7

## 2011-05-24 LAB — COMPREHENSIVE METABOLIC PANEL
ALT: 31
AST: 23
Albumin: 3.9
Alkaline Phosphatase: 124 — ABNORMAL HIGH
Calcium: 9.4
GFR calc Af Amer: >60
Glucose, Bld: 99
Potassium: 4.5
Sodium: 137
Total Protein: 7.2

## 2011-05-24 LAB — DIFFERENTIAL
Basophils Absolute: 0.1
Basophils Relative: 2 — ABNORMAL HIGH
Eosinophils Absolute: 0.2
Eosinophils Relative: 3
Lymphocytes Relative: 24
Lymphs Abs: 2.2
Monocytes Absolute: 0.6
Monocytes Relative: 9
Neutro Abs: 8 — ABNORMAL HIGH
Neutrophils Relative %: 66

## 2011-05-24 LAB — URINE MICROSCOPIC-ADD ON

## 2011-05-24 LAB — URINALYSIS, ROUTINE W REFLEX MICROSCOPIC
Glucose, UA: NEGATIVE
Nitrite: NEGATIVE
Protein, ur: NEGATIVE
pH: 6

## 2011-05-27 LAB — URINALYSIS, ROUTINE W REFLEX MICROSCOPIC
Glucose, UA: NEGATIVE
Ketones, ur: NEGATIVE
Protein, ur: NEGATIVE
pH: 5.5

## 2011-05-27 LAB — URINE MICROSCOPIC-ADD ON

## 2011-05-29 LAB — RAPID STREP SCREEN (MED CTR MEBANE ONLY): Streptococcus, Group A Screen (Direct): POSITIVE — AB

## 2011-05-31 LAB — BASIC METABOLIC PANEL
BUN: 11
Calcium: 9.5
GFR calc non Af Amer: >60
Glucose, Bld: 96
Potassium: 4.2
Sodium: 138

## 2011-05-31 LAB — DIFFERENTIAL
Basophils Absolute: 0
Lymphocytes Relative: 37
Lymphs Abs: 2.4
Neutro Abs: 3.3

## 2011-05-31 LAB — D-DIMER, QUANTITATIVE: D-Dimer, Quant: 0.62 — ABNORMAL HIGH

## 2011-05-31 LAB — CBC
HCT: 41.1
Hemoglobin: 13.9
Platelets: 310
RDW: 12.9
WBC: 6.4

## 2011-05-31 LAB — POCT CARDIAC MARKERS
CKMB, poc: <1 — ABNORMAL LOW
Myoglobin, poc: 66.2
Myoglobin, poc: 70.3
Operator id: 244461
Troponin i, poc: <0.05

## 2011-06-03 LAB — URINALYSIS, ROUTINE W REFLEX MICROSCOPIC
Bilirubin Urine: NEGATIVE
Glucose, UA: NEGATIVE
Ketones, ur: NEGATIVE
Nitrite: NEGATIVE
pH: 5.5

## 2011-06-03 LAB — URINE MICROSCOPIC-ADD ON

## 2011-06-03 LAB — POCT PREGNANCY, URINE: Preg Test, Ur: NEGATIVE

## 2011-06-04 LAB — PREGNANCY, URINE: Preg Test, Ur: NEGATIVE

## 2011-06-04 LAB — DIFFERENTIAL
Eosinophils Relative: 3
Lymphocytes Relative: 24
Lymphs Abs: 2.5
Monocytes Absolute: 1

## 2011-06-04 LAB — URINALYSIS, ROUTINE W REFLEX MICROSCOPIC
Bilirubin Urine: NEGATIVE
Ketones, ur: NEGATIVE
Nitrite: NEGATIVE
Protein, ur: NEGATIVE
Specific Gravity, Urine: 1.026
Urobilinogen, UA: 0.2
pH: 5.5

## 2011-06-04 LAB — URINE CULTURE: Colony Count: >=100000

## 2011-06-04 LAB — URINE MICROSCOPIC-ADD ON

## 2011-06-04 LAB — BASIC METABOLIC PANEL
GFR calc Af Amer: >60
GFR calc non Af Amer: >60
Potassium: 3.8
Sodium: 138

## 2011-06-04 LAB — CBC
HCT: 37.6
Hemoglobin: 13.1
WBC: 10.7 — ABNORMAL HIGH

## 2011-06-06 LAB — CBC
HCT: 38.3 % (ref 36.0–46.0)
Hemoglobin: 13.1 g/dL (ref 12.0–15.0)
MCHC: 34.3 g/dL (ref 30.0–36.0)
MCV: 84.4 fL (ref 78.0–100.0)
Platelets: 323 10*3/uL (ref 150–400)
RDW: 12.3 % (ref 11.5–15.5)

## 2011-06-06 LAB — URINALYSIS, ROUTINE W REFLEX MICROSCOPIC
Bilirubin Urine: NEGATIVE
Glucose, UA: NEGATIVE mg/dL
Hgb urine dipstick: NEGATIVE
Ketones, ur: NEGATIVE mg/dL
Protein, ur: NEGATIVE mg/dL
Urobilinogen, UA: 0.2 mg/dL (ref 0.0–1.0)

## 2011-06-21 ENCOUNTER — Emergency Department (HOSPITAL_COMMUNITY)
Admission: EM | Admit: 2011-06-21 | Discharge: 2011-06-22 | Disposition: A | Discharge status: Home / Self Care | Payer: Medicaid Other | Attending: Emergency Medicine | Admitting: Emergency Medicine

## 2011-06-21 DIAGNOSIS — F329 Major depressive disorder, single episode, unspecified: Secondary | ICD-10-CM | POA: Insufficient documentation

## 2011-06-21 DIAGNOSIS — F3289 Other specified depressive episodes: Secondary | ICD-10-CM | POA: Insufficient documentation

## 2011-06-21 DIAGNOSIS — R1011 Right upper quadrant pain: Secondary | ICD-10-CM | POA: Insufficient documentation

## 2011-06-21 DIAGNOSIS — E282 Polycystic ovarian syndrome: Secondary | ICD-10-CM | POA: Insufficient documentation

## 2011-06-21 DIAGNOSIS — K8020 Calculus of gallbladder without cholecystitis without obstruction: Secondary | ICD-10-CM | POA: Insufficient documentation

## 2011-06-21 LAB — POCT I-STAT, CHEM 8
BUN: 13 mg/dL (ref 6–23)
Calcium, Ion: 1.15 mmol/L (ref 1.12–1.32)
Chloride: 100 mEq/L (ref 96–112)
Creatinine, Ser: 1 mg/dL (ref 0.50–1.10)
Glucose, Bld: 96 mg/dL (ref 70–99)
HCT: 39 % (ref 36.0–46.0)
Hemoglobin: 13.3 g/dL (ref 12.0–15.0)
Potassium: 3.8 mEq/L (ref 3.5–5.1)
Sodium: 138 mEq/L (ref 135–145)
TCO2: 27 mmol/L (ref 0–100)

## 2011-06-21 LAB — COMPREHENSIVE METABOLIC PANEL
ALT: 27 U/L (ref 0–35)
AST: 20 U/L (ref 0–37)
Albumin: 3.9 g/dL (ref 3.5–5.2)
Calcium: 9.5 mg/dL (ref 8.4–10.5)
Creatinine, Ser: 0.8 mg/dL (ref 0.50–1.10)
Sodium: 137 mEq/L (ref 135–145)

## 2011-06-21 LAB — CBC
MCH: 28 pg (ref 26.0–34.0)
MCV: 84.7 fL (ref 78.0–100.0)
Platelets: 354 10*3/uL (ref 150–400)
RBC: 4.64 MIL/uL (ref 3.87–5.11)
RDW: 12.5 % (ref 11.5–15.5)
WBC: 8.7 10*3/uL (ref 4.0–10.5)

## 2011-06-21 LAB — URINE MICROSCOPIC-ADD ON

## 2011-06-21 LAB — DIFFERENTIAL
Basophils Relative: 1 % (ref 0–1)
Eosinophils Absolute: 0.3 10*3/uL (ref 0.0–0.7)
Eosinophils Relative: 3 % (ref 0–5)
Lymphs Abs: 3.4 10*3/uL (ref 0.7–4.0)
Monocytes Relative: 10 % (ref 3–12)
Neutrophils Relative %: 47 % (ref 43–77)

## 2011-06-21 LAB — URINALYSIS, ROUTINE W REFLEX MICROSCOPIC
Glucose, UA: NEGATIVE mg/dL
Hgb urine dipstick: NEGATIVE
Specific Gravity, Urine: 1.026 (ref 1.005–1.030)
pH: 5.5 (ref 5.0–8.0)

## 2011-06-22 ENCOUNTER — Emergency Department (HOSPITAL_COMMUNITY): Payer: Medicaid Other

## 2011-06-26 ENCOUNTER — Other Ambulatory Visit (HOSPITAL_COMMUNITY): Payer: Self-pay | Admitting: Family Medicine

## 2011-06-26 ENCOUNTER — Encounter (HOSPITAL_COMMUNITY)
Admission: RE | Admit: 2011-06-26 | Discharge: 2011-06-26 | Disposition: A | Discharge status: Home / Self Care | Payer: Medicaid Other | Source: Ambulatory Visit | Attending: Family Medicine | Admitting: Family Medicine

## 2011-06-26 DIAGNOSIS — R1084 Generalized abdominal pain: Secondary | ICD-10-CM

## 2011-06-26 DIAGNOSIS — R112 Nausea with vomiting, unspecified: Secondary | ICD-10-CM

## 2011-06-26 DIAGNOSIS — R109 Unspecified abdominal pain: Secondary | ICD-10-CM | POA: Insufficient documentation

## 2011-06-26 MED ORDER — TECHNETIUM TC 99M MEBROFENIN IV KIT
5.5000 | PACK | Freq: Once | INTRAVENOUS | Status: AC | PRN
  Administered 2011-06-26: 6 via INTRAVENOUS

## 2011-07-12 ENCOUNTER — Encounter (INDEPENDENT_AMBULATORY_CARE_PROVIDER_SITE_OTHER): Payer: Self-pay | Admitting: General Surgery

## 2011-08-22 ENCOUNTER — Encounter (HOSPITAL_BASED_OUTPATIENT_CLINIC_OR_DEPARTMENT_OTHER): Payer: Self-pay | Admitting: *Deleted

## 2011-08-22 ENCOUNTER — Emergency Department (HOSPITAL_BASED_OUTPATIENT_CLINIC_OR_DEPARTMENT_OTHER)
Admission: EM | Admit: 2011-08-22 | Discharge: 2011-08-23 | Disposition: A | Discharge status: Home / Self Care | Payer: Medicaid Other | Attending: Emergency Medicine | Admitting: Emergency Medicine

## 2011-08-22 DIAGNOSIS — H9202 Otalgia, left ear: Secondary | ICD-10-CM

## 2011-08-22 DIAGNOSIS — H9209 Otalgia, unspecified ear: Secondary | ICD-10-CM | POA: Insufficient documentation

## 2011-08-22 DIAGNOSIS — F341 Dysthymic disorder: Secondary | ICD-10-CM | POA: Insufficient documentation

## 2011-08-22 DIAGNOSIS — Z79899 Other long term (current) drug therapy: Secondary | ICD-10-CM | POA: Insufficient documentation

## 2011-08-22 HISTORY — DX: Major depressive disorder, single episode, unspecified: F32.9

## 2011-08-22 HISTORY — DX: Depression, unspecified: F32.A

## 2011-08-22 HISTORY — DX: Anxiety disorder, unspecified: F41.9

## 2011-08-22 NOTE — ED Notes (Signed)
Left ear pain for a week, was placed on amoxicillin appx a week ago. Tonight she felt a pain in her left ear that she describes as feeling as though it ruptured. Now having drainage that had previously resolved.

## 2011-08-23 MED ORDER — NAPROXEN 500 MG PO TABS: 500.0000 mg | ORAL_TABLET | Freq: Two times a day (BID) | ORAL | Status: DC

## 2011-08-23 MED ORDER — KETOROLAC TROMETHAMINE 60 MG/2ML IM SOLN
60.0000 mg | Freq: Once | INTRAMUSCULAR | Status: AC
  Administered 2011-08-23: 60 mg via INTRAMUSCULAR
  Filled 2011-08-23: qty 2

## 2011-08-23 MED ORDER — CIPROFLOXACIN-DEXAMETHASONE 0.3-0.1 % OT SUSP
4.0000 [drp] | Freq: Once | OTIC | Status: AC
  Administered 2011-08-23: 4 [drp] via OTIC
  Filled 2011-08-23: qty 7.5

## 2011-08-23 NOTE — ED Provider Notes (Signed)
History     CSN: [id number]  Arrival date & time 08/22/11  2310   First MD Initiated Contact with Patient 08/23/11 0004      Chief Complaint  Patient presents with  . Otalgia    (Consider location/radiation/quality/duration/timing/severity/associated sxs/prior treatment) HPI Comments: 30 year old female with a history of left ear inflammation for the last 10 days. She has been seen prior to her visit here today by her family doctor and treated with oral amoxicillin for sinusitis and left ear pain. She states that her sinus symptoms and headaches have gone away but she has persistent left ear pain. At approximately 10:15 PM this evening she felt acute onset of left ear pain. This occurred while she was yawning. It seems now to be persistent, worse with talking, yawning and moving her head. It is not associated with fevers, vomiting, coughing or shortness of breath. She states that the pain is severe, worse with manipulation of the left ear, and persistent. She has been seen by an ear nose and throat specialist Dr. Marisa Sprinkles in the past.  Patient is a 30 y.o. female presenting with ear pain. The history is provided by the patient.  Otalgia Pertinent negatives include no headaches, no diarrhea, no vomiting, no cough and no rash.    Past Medical History  Diagnosis Date  . Bronchitis   . Polycystic ovary syndrome   . Anxiety   . Depression     Past Surgical History  Procedure Date  . Tubal ligation   . Hernia repair     No family history on file.  History  Substance Use Topics  . Smoking status: Never Smoker   . Smokeless tobacco: Not on file  . Alcohol Use: No    OB History    Grav Para Term Preterm Abortions TAB SAB Ect Mult Living                  Review of Systems  Constitutional: Negative for fever.  HENT: Positive for ear pain. Negative for sinus pressure.   Respiratory: Negative for cough, chest tightness and shortness of breath.   Cardiovascular: Negative for  chest pain.  Gastrointestinal: Negative for nausea, vomiting and diarrhea.  Musculoskeletal: Negative for back pain.  Skin: Negative for rash.  Neurological: Negative for headaches.    Allergies  Hydrocodone  Home Medications   Current Outpatient Rx  Name Route Sig Dispense Refill  . OMEPRAZOLE 10 MG PO CPDR Oral Take 10 mg by mouth daily.      . SERTRALINE HCL 100 MG PO TABS Oral Take 100 mg by mouth daily.      . ALBUTEROL SULFATE HFA 108 (90 BASE) MCG/ACT IN AERS Inhalation Inhale 2 puffs into the lungs every 4 (four) hours as needed for wheezing. 1 Inhaler 0  . CINNAMON 500 MG PO CAPS Oral Take 500 mg by mouth daily.      . OMEGA-3 FATTY ACIDS 1000 MG PO CAPS Oral Take 2 g by mouth daily. Hold while in hospital     . METFORMIN HCL 500 MG PO TABS Oral Take 500 mg by mouth 2 (two) times daily with a meal.     . MULTIVITAMINS PO TABS Oral Take 1 tablet by mouth daily.      Marland Kitchen NAPROXEN 500 MG PO TABS Oral Take 1 tablet (500 mg total) by mouth 2 (two) times daily with a meal. 30 tablet 0    BP 136/76  Pulse 87  Temp 98.3 F (36.8 C)  Resp 18  SpO2 99%  Physical Exam  Nursing note and vitals reviewed. Constitutional: She appears well-developed and well-nourished. No distress.  HENT:  Head: Normocephalic and atraumatic.  Mouth/Throat: Oropharynx is clear and moist. No oropharyngeal exudate.       Nares clear, right tympanic membrane normal, right external canal normal, left tympanic membrane with erythema but no purulent drainage, bulging or loss of landmarks. Left auricle tender to manipulation, pain with manipulation of the tragus. No swelling or discharge or discoloration or desquamation of the left external auditory canal  Eyes: Conjunctivae and EOM are normal. Pupils are equal, round, and reactive to light. Right eye exhibits no discharge. Left eye exhibits no discharge. No scleral icterus.  Neck: Normal range of motion. Neck supple. No JVD present. No thyromegaly present.    Cardiovascular: Normal rate, regular rhythm, normal heart sounds and intact distal pulses.  Exam reveals no gallop and no friction rub.   No murmur heard. Pulmonary/Chest: Effort normal and breath sounds normal. No respiratory distress. She has no wheezes. She has no rales.  Musculoskeletal: Normal range of motion. She exhibits no edema and no tenderness.  Lymphadenopathy:    She has no cervical adenopathy.  Neurological: She is alert. Coordination normal.  Skin: Skin is warm and dry. No rash noted. No erythema.  Psychiatric: She has a normal mood and affect. Her behavior is normal.    ED Course  Procedures (including critical care time)  Labs Reviewed - No data to display No results found.   1. Otalgia of left ear       MDM  Patient has possible early otitis externa, there is no discharge, perforation of the tympanic membrane. She is able to open and close her mouth without pain, doubt temporomandibular joint syndrome, she declines pain medications other than Toradol and Ciprodex ear drops. She wants to followup with her doctor tomorrow and has ear nose and throat doctor followup as needed.  I see no evidence of perforation of TM at this time        Vida Roller, MD 08/23/11 [phone]

## 2011-09-05 DIAGNOSIS — E669 Obesity, unspecified: Secondary | ICD-10-CM | POA: Insufficient documentation

## 2011-09-19 ENCOUNTER — Emergency Department (INDEPENDENT_AMBULATORY_CARE_PROVIDER_SITE_OTHER): Payer: Medicaid Other

## 2011-09-19 ENCOUNTER — Encounter (HOSPITAL_BASED_OUTPATIENT_CLINIC_OR_DEPARTMENT_OTHER): Payer: Self-pay | Admitting: *Deleted

## 2011-09-19 ENCOUNTER — Emergency Department (HOSPITAL_BASED_OUTPATIENT_CLINIC_OR_DEPARTMENT_OTHER)
Admission: EM | Admit: 2011-09-19 | Discharge: 2011-09-19 | Disposition: A | Discharge status: Home / Self Care | Payer: Medicaid Other | Attending: Emergency Medicine | Admitting: Emergency Medicine

## 2011-09-19 DIAGNOSIS — R0602 Shortness of breath: Secondary | ICD-10-CM

## 2011-09-19 DIAGNOSIS — R0989 Other specified symptoms and signs involving the circulatory and respiratory systems: Secondary | ICD-10-CM

## 2011-09-19 DIAGNOSIS — J069 Acute upper respiratory infection, unspecified: Secondary | ICD-10-CM | POA: Insufficient documentation

## 2011-09-19 DIAGNOSIS — R05 Cough: Secondary | ICD-10-CM

## 2011-09-19 DIAGNOSIS — J111 Influenza due to unidentified influenza virus with other respiratory manifestations: Secondary | ICD-10-CM

## 2011-09-19 DIAGNOSIS — R51 Headache: Secondary | ICD-10-CM | POA: Insufficient documentation

## 2011-09-19 DIAGNOSIS — J3489 Other specified disorders of nose and nasal sinuses: Secondary | ICD-10-CM | POA: Insufficient documentation

## 2011-09-19 DIAGNOSIS — F341 Dysthymic disorder: Secondary | ICD-10-CM | POA: Insufficient documentation

## 2011-09-19 MED ORDER — IBUPROFEN 800 MG PO TABS: 800.0000 mg | ORAL_TABLET | Freq: Three times a day (TID) | ORAL | Status: AC

## 2011-09-19 MED ORDER — ONDANSETRON 8 MG PO TBDP
8.0000 mg | ORAL_TABLET | Freq: Once | ORAL | Status: AC
  Administered 2011-09-19: 8 mg via ORAL
  Filled 2011-09-19: qty 1

## 2011-09-19 MED ORDER — ONDANSETRON 8 MG PO TBDP: 8.0000 mg | ORAL_TABLET | Freq: Three times a day (TID) | ORAL | Status: AC | PRN

## 2011-09-19 MED ORDER — OSELTAMIVIR PHOSPHATE 75 MG PO CAPS: 75.0000 mg | ORAL_CAPSULE | Freq: Two times a day (BID) | ORAL | Status: AC

## 2011-09-19 MED ORDER — DEXAMETHASONE SODIUM PHOSPHATE 10 MG/ML IJ SOLN
10.0000 mg | Freq: Once | INTRAMUSCULAR | Status: AC
  Administered 2011-09-19: 10 mg via INTRAMUSCULAR
  Filled 2011-09-19: qty 1

## 2011-09-19 MED ORDER — FLUTICASONE PROPIONATE 50 MCG/ACT NA SUSP: 2.0000 | Freq: Every day | NASAL | Status: DC

## 2011-09-19 MED ORDER — KETOROLAC TROMETHAMINE 60 MG/2ML IM SOLN
60.0000 mg | Freq: Once | INTRAMUSCULAR | Status: AC
  Administered 2011-09-19: 60 mg via INTRAMUSCULAR
  Filled 2011-09-19: qty 2

## 2011-09-19 NOTE — ED Notes (Signed)
Cold symptoms for a few days. Non productive cough. Chills. This am she felt sob and had chest heaviness.

## 2011-09-19 NOTE — ED Provider Notes (Signed)
History     CSN: [id number]  Arrival date & time 09/19/11  1234   First MD Initiated Contact with Patient 09/19/11 1241    12:50 PM HPI Stacy Deleon is a 31 y.o. female c/o URI for 2 days. Sxs include congestion, sore throat, SOB, cough, chest congestion, chills and nausea. Denies fever, neck pain, abdominal pain, urinary symptoms.  Patient is a 31 y.o. female presenting with URI. The history is provided by the patient.  URI The primary symptoms include headaches, ear pain, sore throat, cough, nausea and myalgias. Primary symptoms do not include fever, abdominal pain, vomiting or rash. The current episode started 2 days ago. This is a new problem. The problem has been gradually worsening.  The headache began 2 days ago. The headache developed gradually. The headache is not associated with photophobia, double vision, eye pain, stiff neck or weakness.  The ear pain began 2 days ago. Ear pain is a new problem. The ear pain has been unchanged since its onset. The right ear is affected. The pain is mild.    The cough began 2 days ago. The cough is new. The cough is non-productive.  The nausea is exacerbated by food.  The myalgias are not associated with weakness.  Symptoms associated with the illness include chills, sinus pressure, congestion and rhinorrhea. The following treatments were addressed: A decongestant was ineffective. NSAIDs were ineffective.    Past Medical History  Diagnosis Date  . Bronchitis   . Polycystic ovary syndrome   . Anxiety   . Depression     Past Surgical History  Procedure Date  . Tubal ligation   . Hernia repair     No family history on file.  History  Substance Use Topics  . Smoking status: Never Smoker   . Smokeless tobacco: Not on file  . Alcohol Use: No    OB History    Grav Para Term Preterm Abortions TAB SAB Ect Mult Living                  Review of Systems  Constitutional: Positive for chills. Negative for fever.  HENT: Positive  for ear pain, congestion, sore throat, rhinorrhea and sinus pressure. Negative for neck pain and ear discharge.   Eyes: Negative for double vision, photophobia and pain.  Respiratory: Positive for cough and shortness of breath.   Cardiovascular: Negative for chest pain.  Gastrointestinal: Positive for nausea. Negative for vomiting and abdominal pain.  Genitourinary: Negative for dysuria, urgency and frequency.  Musculoskeletal: Positive for myalgias. Negative for back pain.  Skin: Negative for rash.  Neurological: Positive for headaches. Negative for dizziness, weakness, light-headedness and numbness.  All other systems reviewed and are negative.    Allergies  Hydrocodone  Home Medications   Current Outpatient Rx  Name Route Sig Dispense Refill  . ALBUTEROL SULFATE HFA 108 (90 BASE) MCG/ACT IN AERS Inhalation Inhale 2 puffs into the lungs every 4 (four) hours as needed for wheezing. 1 Inhaler 0  . CINNAMON 500 MG PO CAPS Oral Take 500 mg by mouth daily.      . OMEGA-3 FATTY ACIDS 1000 MG PO CAPS Oral Take 2 g by mouth daily. Hold while in hospital     . METFORMIN HCL 500 MG PO TABS Oral Take 500 mg by mouth 2 (two) times daily with a meal.     . MULTIVITAMINS PO TABS Oral Take 1 tablet by mouth daily.      Marland Kitchen NAPROXEN 500 MG PO  TABS Oral Take 1 tablet (500 mg total) by mouth 2 (two) times daily with a meal. 30 tablet 0  . OMEPRAZOLE 10 MG PO CPDR Oral Take 10 mg by mouth daily.      . SERTRALINE HCL 100 MG PO TABS Oral Take 100 mg by mouth daily.        There were no vitals taken for this visit.  Physical Exam  Vitals reviewed. Constitutional: She is oriented to person, place, and time. Vital signs are normal. She appears well-developed and well-nourished.  HENT:  Head: Normocephalic and atraumatic.  Right Ear: Hearing, tympanic membrane, external ear and ear canal normal.  Left Ear: Hearing, tympanic membrane, external ear and ear canal normal.  Nose: Rhinorrhea present.    Mouth/Throat: Uvula is midline, oropharynx is clear and moist and mucous membranes are normal.  Eyes: Conjunctivae are normal. Pupils are equal, round, and reactive to light.  Neck: Normal range of motion. Neck supple.  Cardiovascular: Normal rate, regular rhythm and normal heart sounds.  Exam reveals no friction rub.   No murmur heard. Pulmonary/Chest: Effort normal and breath sounds normal. She has no wheezes. She has no rhonchi. She has no rales. She exhibits no tenderness.  Musculoskeletal: Normal range of motion.  Neurological: She is alert and oriented to person, place, and time. Coordination normal.  Skin: Skin is warm and dry. No rash noted. No erythema. No pallor.    ED Course  Procedures   Dg Chest 2 View  09/19/2011  *RADIOLOGY REPORT*  Clinical Data: Shortness of breath, cough, congestion  CHEST - 2 VIEW  Comparison: May 20, 2011  Findings: The cardiac silhouette, mediastinum, pulmonary vasculature are within normal limits.  Both lungs are clear. There is no acute bony abnormality.  IMPRESSION: There is no evidence of acute cardiac or pulmonary process.  Original Report Authenticated By: Brandon Melnick, M.D.   MDM   Patient reports improvement of her symptoms. Will discharge patient with influenza-like illness. Advised she can take Tamiflu but likely this is a viral infection. Advised symptomatic treatment. Since recent plan and is ready for discharge     Thomasene Lot, PA-C 09/19/11 1407

## 2011-09-20 NOTE — ED Provider Notes (Signed)
Medical screening examination/treatment/procedure(s) were performed by non-physician practitioner and as supervising physician I was immediately available for consultation/collaboration.  Rodrick Payson, MD 09/20/11 0723 

## 2011-11-11 ENCOUNTER — Encounter (HOSPITAL_BASED_OUTPATIENT_CLINIC_OR_DEPARTMENT_OTHER): Payer: Self-pay

## 2011-11-11 ENCOUNTER — Emergency Department (INDEPENDENT_AMBULATORY_CARE_PROVIDER_SITE_OTHER): Payer: Medicaid Other

## 2011-11-11 ENCOUNTER — Emergency Department (HOSPITAL_BASED_OUTPATIENT_CLINIC_OR_DEPARTMENT_OTHER)
Admission: EM | Admit: 2011-11-11 | Discharge: 2011-11-11 | Disposition: A | Discharge status: Home / Self Care | Payer: Medicaid Other | Attending: Emergency Medicine | Admitting: Emergency Medicine

## 2011-11-11 DIAGNOSIS — F329 Major depressive disorder, single episode, unspecified: Secondary | ICD-10-CM | POA: Insufficient documentation

## 2011-11-11 DIAGNOSIS — M79609 Pain in unspecified limb: Secondary | ICD-10-CM

## 2011-11-11 DIAGNOSIS — M722 Plantar fascial fibromatosis: Secondary | ICD-10-CM

## 2011-11-11 DIAGNOSIS — E282 Polycystic ovarian syndrome: Secondary | ICD-10-CM | POA: Insufficient documentation

## 2011-11-11 DIAGNOSIS — F411 Generalized anxiety disorder: Secondary | ICD-10-CM | POA: Insufficient documentation

## 2011-11-11 DIAGNOSIS — F3289 Other specified depressive episodes: Secondary | ICD-10-CM | POA: Insufficient documentation

## 2011-11-11 MED ORDER — OXYCODONE-ACETAMINOPHEN 5-325 MG PO TABS: 1.0000 | ORAL_TABLET | Freq: Four times a day (QID) | ORAL | Status: AC | PRN

## 2011-11-11 MED ORDER — NAPROXEN 500 MG PO TABS: 500.0000 mg | ORAL_TABLET | Freq: Two times a day (BID) | ORAL | Status: DC

## 2011-11-11 NOTE — Discharge Instructions (Signed)

## 2011-11-11 NOTE — ED Notes (Signed)
C/o foot pain x 6 months-denies injury

## 2011-11-11 NOTE — ED Provider Notes (Signed)
History     CSN: [id number]  Arrival date & time 11/11/11  4098   First MD Initiated Contact with Patient 11/11/11 2038      Chief Complaint  Patient presents with  . Foot Pain    (Consider location/radiation/quality/duration/timing/severity/associated sxs/prior treatment) Patient is a 31 y.o. female presenting with lower extremity pain. The history is provided by the patient. No language interpreter was used.  Foot Pain This is a recurrent problem. The current episode started more than 1 month ago. The problem occurs 2 to 4 times per day. The problem has been gradually worsening. Pertinent negatives include no numbness or weakness. The symptoms are aggravated by standing. She has tried rest for the symptoms. The treatment provided no relief.  Patient states pain is primarily in bottom of foot, worse upon awaking in AM and after prolonged sitting.  No calf pain.  Past Medical History  Diagnosis Date  . Bronchitis   . Polycystic ovary syndrome   . Anxiety   . Depression     Past Surgical History  Procedure Date  . Tubal ligation   . Hernia repair     No family history on file.  History  Substance Use Topics  . Smoking status: Never Smoker   . Smokeless tobacco: Not on file  . Alcohol Use: No    OB History    Grav Para Term Preterm Abortions TAB SAB Ect Mult Living                  Review of Systems  Musculoskeletal: Positive for gait problem.  Neurological: Negative for weakness and numbness.  All other systems reviewed and are negative.    Allergies  Hydrocodone  Home Medications   Current Outpatient Rx  Name Route Sig Dispense Refill  . ACETAMINOPHEN 325 MG PO TABS Oral Take 650 mg by mouth every 6 (six) hours as needed. Patient uses this medication for foot pain.    Marland Kitchen METFORMIN HCL 500 MG PO TABS Oral Take 500 mg by mouth 2 (two) times daily with a meal.     . MULTIVITAMINS PO TABS Oral Take 1 tablet by mouth daily.      Marland Kitchen OMEPRAZOLE 10 MG PO CPDR  Oral Take 10 mg by mouth daily.      . ALBUTEROL SULFATE HFA 108 (90 BASE) MCG/ACT IN AERS Inhalation Inhale 2 puffs into the lungs every 4 (four) hours as needed for wheezing. 1 Inhaler 0  . CINNAMON 500 MG PO CAPS Oral Take 500 mg by mouth daily.      . OMEGA-3 FATTY ACIDS 1000 MG PO CAPS Oral Take 2 g by mouth daily. Hold while in hospital     . FLUTICASONE PROPIONATE 50 MCG/ACT NA SUSP Nasal Place 2 sprays into the nose daily. 16 g 2    BP 126/83  Pulse 90  Temp(Src) 98.2 F (36.8 C) (Oral)  Resp 16  Ht 5\' 6"  (1.676 m)  Wt 217 lb (98.431 kg)  BMI 35.02 kg/m2  SpO2 100%  LMP 11/06/2011  Physical Exam  Nursing note and vitals reviewed. Constitutional: She is oriented to person, place, and time. She appears well-developed and well-nourished.  HENT:  Head: Normocephalic.  Eyes: Pupils are equal, round, and reactive to light.  Neck: Normal range of motion. Neck supple.  Cardiovascular: Normal rate, regular rhythm, normal heart sounds and intact distal pulses.   Pulmonary/Chest: Effort normal and breath sounds normal.  Abdominal: Soft. Bowel sounds are normal.  Musculoskeletal: Normal  range of motion. She exhibits tenderness.  Neurological: She is alert and oriented to person, place, and time.  Skin: Skin is warm and dry.  Psychiatric: She has a normal mood and affect. Her behavior is normal. Judgment and thought content normal.    ED Course  Procedures (including critical care time)  Labs Reviewed - No data to display No results found.  Normal plain film of foot. No diagnosis found.  Plantar faciitis  MDM          Jimmye Norman, NP 11/11/11 2112

## 2011-11-12 NOTE — ED Provider Notes (Signed)
Medical screening examination/treatment/procedure(s) were performed by non-physician practitioner and as supervising physician I was immediately available for consultation/collaboration.   Joya Gaskins, MD 11/12/11 [phone]

## 2011-12-07 ENCOUNTER — Emergency Department (INDEPENDENT_AMBULATORY_CARE_PROVIDER_SITE_OTHER): Payer: Medicaid Other

## 2011-12-07 ENCOUNTER — Emergency Department (HOSPITAL_BASED_OUTPATIENT_CLINIC_OR_DEPARTMENT_OTHER)
Admission: EM | Admit: 2011-12-07 | Discharge: 2011-12-07 | Disposition: A | Discharge status: Home / Self Care | Payer: Medicaid Other | Attending: Emergency Medicine | Admitting: Emergency Medicine

## 2011-12-07 ENCOUNTER — Encounter (HOSPITAL_BASED_OUTPATIENT_CLINIC_OR_DEPARTMENT_OTHER): Payer: Self-pay | Admitting: *Deleted

## 2011-12-07 DIAGNOSIS — X58XXXA Exposure to other specified factors, initial encounter: Secondary | ICD-10-CM

## 2011-12-07 DIAGNOSIS — W19XXXA Unspecified fall, initial encounter: Secondary | ICD-10-CM | POA: Insufficient documentation

## 2011-12-07 DIAGNOSIS — M79609 Pain in unspecified limb: Secondary | ICD-10-CM

## 2011-12-07 DIAGNOSIS — S43409A Unspecified sprain of unspecified shoulder joint, initial encounter: Secondary | ICD-10-CM

## 2011-12-07 DIAGNOSIS — S63509A Unspecified sprain of unspecified wrist, initial encounter: Secondary | ICD-10-CM

## 2011-12-07 DIAGNOSIS — IMO0002 Reserved for concepts with insufficient information to code with codable children: Secondary | ICD-10-CM | POA: Insufficient documentation

## 2011-12-07 MED ORDER — OXYCODONE-ACETAMINOPHEN 5-325 MG PO TABS
1.0000 | ORAL_TABLET | Freq: Once | ORAL | Status: AC
  Administered 2011-12-07: 1 via ORAL
  Filled 2011-12-07: qty 1

## 2011-12-07 MED ORDER — OXYCODONE-ACETAMINOPHEN 5-325 MG PO TABS: 1.0000 | ORAL_TABLET | ORAL | Status: AC | PRN

## 2011-12-07 MED ORDER — NAPROXEN 500 MG PO TABS: 500.0000 mg | ORAL_TABLET | Freq: Two times a day (BID) | ORAL | Status: DC

## 2011-12-07 NOTE — ED Notes (Signed)
Pt states she was walking the dog and fell injuring her left hand. Throbbing from fingertips to elbow and pain to shoulder. +radial pulse palp. Unable to move fingers at this time.

## 2011-12-07 NOTE — ED Provider Notes (Signed)
History     CSN: [id number]  Arrival date & time 12/07/11  1349   First MD Initiated Contact with Patient 12/07/11 1425      Chief Complaint  Patient presents with  . Hand Pain    (Consider location/radiation/quality/duration/timing/severity/associated sxs/prior treatment) HPI Pt fell this afternoon onto her left hand while walking the dog.  The pain is primarily in her hand.  It hurts up toward her elbow.  She also has some trouble in her left shoulder where she fell.  No LOC or head injury.  NO chest pain or shortness of breath.  NO abdominal pain. Past Medical History  Diagnosis Date  . Bronchitis   . Polycystic ovary syndrome   . Anxiety   . Depression     Past Surgical History  Procedure Date  . Tubal ligation   . Hernia repair     History reviewed. No pertinent family history.  History  Substance Use Topics  . Smoking status: Never Smoker   . Smokeless tobacco: Not on file  . Alcohol Use: No    OB History    Grav Para Term Preterm Abortions TAB SAB Ect Mult Living                  Review of Systems  All other systems reviewed and are negative.    Allergies  Hydrocodone  Home Medications   Current Outpatient Rx  Name Route Sig Dispense Refill  . ACETAMINOPHEN 325 MG PO TABS Oral Take 650 mg by mouth every 6 (six) hours as needed. Patient uses this medication for foot pain.    . ALBUTEROL SULFATE HFA 108 (90 BASE) MCG/ACT IN AERS Inhalation Inhale 2 puffs into the lungs every 4 (four) hours as needed for wheezing. 1 Inhaler 0  . CINNAMON 500 MG PO CAPS Oral Take 500 mg by mouth daily.      . OMEGA-3 FATTY ACIDS 1000 MG PO CAPS Oral Take 2 g by mouth daily. Hold while in hospital     . FLUTICASONE PROPIONATE 50 MCG/ACT NA SUSP Nasal Place 2 sprays into the nose daily. 16 g 2  . METFORMIN HCL 500 MG PO TABS Oral Take 500 mg by mouth 2 (two) times daily with a meal.     . MULTIVITAMINS PO TABS Oral Take 1 tablet by mouth daily.      Marland Kitchen NAPROXEN 500 MG  PO TABS Oral Take 1 tablet (500 mg total) by mouth 2 (two) times daily. 30 tablet 0  . OMEPRAZOLE 10 MG PO CPDR Oral Take 10 mg by mouth daily.        BP 129/80  Pulse 99  Temp(Src) 98.5 F (36.9 C) (Oral)  Resp 20  Ht 5\' 6"  (1.676 m)  Wt 210 lb (95.255 kg)  BMI 33.89 kg/m2  SpO2 98%  LMP 12/07/2011  Physical Exam  Nursing note and vitals reviewed. Constitutional: She appears well-developed and well-nourished. No distress.  HENT:  Head: Normocephalic and atraumatic.  Right Ear: External ear normal.  Left Ear: External ear normal.  Eyes: Conjunctivae are normal. Right eye exhibits no discharge. Left eye exhibits no discharge. No scleral icterus.  Neck: Neck supple. No tracheal deviation present.  Cardiovascular: Normal rate.   Pulmonary/Chest: Effort normal. No stridor. No respiratory distress.  Musculoskeletal: She exhibits no edema.       Left shoulder: She exhibits tenderness.       Left elbow: She exhibits no swelling, no effusion and no deformity. tenderness found.  Left wrist: She exhibits decreased range of motion, tenderness and bony tenderness. She exhibits no swelling and no effusion.  Neurological: She is alert. Cranial nerve deficit: no gross deficits.  Skin: Skin is warm and dry. No rash noted.  Psychiatric: She has a normal mood and affect.    ED Course  Procedures (including critical care time)  Labs Reviewed - No data to display Dg Forearm Left  12/07/2011  *RADIOLOGY REPORT*  Clinical Data: Pain post injury  LEFT FOREARM - 2 VIEW  Comparison: None.  Findings: Two views of the left forearm submitted.  No acute fracture or subluxation.  No posterior fat pad sign.  IMPRESSION: No acute fracture or subluxation.  Original Report Authenticated By: Natasha Mead, M.D.   Dg Wrist Complete Left  12/07/2011  *RADIOLOGY REPORT*  Clinical Data: Hand pain post injury  LEFT WRIST - COMPLETE 3+ VIEW  Comparison: None.  Findings: Four views of the left wrist submitted.  No  acute fracture or subluxation.  No radiopaque foreign body.  IMPRESSION: No acute fracture or subluxation.  Original Report Authenticated By: Natasha Mead, M.D.   Dg Shoulder Left  12/07/2011  *RADIOLOGY REPORT*  Clinical Data: Pain post injury  LEFT SHOULDER - 2+ VIEW  Comparison: None.  Findings: Four views of the left shoulder submitted.  No acute fracture or subluxation.  No radiopaque foreign body.  IMPRESSION: No acute fracture or subluxation.  Original Report Authenticated By: Natasha Mead, M.D.   Dg Hand Complete Left  12/07/2011  *RADIOLOGY REPORT*  Clinical Data: Pain post injury  LEFT HAND - COMPLETE 3+ VIEW  Comparison: None.  Findings: Three views of the left hand submitted.  No acute fracture or subluxation.  No radiopaque foreign body.  IMPRESSION: No acute fracture or subluxation.  Original Report Authenticated By: Natasha Mead, M.D.     MDM  The patient appears to have a sprain of her wrist and upper extremity. There does not appear to be evidence of fracture or dislocation. She was placed in a splint and immobilizer for comfort.        Celene Kras, MD 12/07/11 [phone]

## 2011-12-07 NOTE — Discharge Instructions (Signed)
Ligament Sprain  Ligaments are tough, fibrous tissues that hold bones together at the joints. A sprain can occur when a ligament is stretched. This injury may take several weeks to heal.  HOME CARE INSTRUCTIONS    Rest the injured area for as long as directed by your caregiver. Then slowly start using the joint as directed by your caregiver and as the pain allows.   Keep the affected joint raised if possible to lessen swelling.   Apply ice for 15 to 20 minutes to the injured area every couple hours for the first half day, then 3 to 4 times per day for the first 48 hours. Put the ice in a plastic bag and place a towel between the bag of ice and your skin.   Wear any splinting, casting, or elastic bandage applications as instructed.   Only take over-the-counter or prescription medicines for pain, discomfort, or fever as directed by your caregiver. Do not use aspirin immediately after the injury unless instructed by your caregiver. Aspirin can cause increased bleeding and bruising of the tissues.   If you were given crutches, continue to use them as instructed and do not resume weight bearing on the affected extremity until instructed.  SEEK MEDICAL CARE IF:    Your bruising, swelling, or pain increases.   You have cold and numb fingers or toes if your arm or leg was injured.  SEEK IMMEDIATE MEDICAL CARE IF:    Your toes are numb or blue if your leg was injured.   Your fingers are numb or blue if your arm was injured.   Your pain is not responding to medicines and continues to stay the same or gets worse.  MAKE SURE YOU:    Understand these instructions.   Will watch your condition.   Will get help right away if you are not doing well or get worse.  Document Released: 08/16/2000 Document Revised: 08/08/2011 Document Reviewed: 06/14/2008  [employer] Patient Information 2012 [employer].

## 2012-03-31 DIAGNOSIS — E282 Polycystic ovarian syndrome: Secondary | ICD-10-CM | POA: Insufficient documentation

## 2012-04-06 DIAGNOSIS — Z87891 Personal history of nicotine dependence: Secondary | ICD-10-CM | POA: Insufficient documentation

## 2012-04-06 DIAGNOSIS — F341 Dysthymic disorder: Secondary | ICD-10-CM | POA: Insufficient documentation

## 2012-06-29 ENCOUNTER — Emergency Department (HOSPITAL_BASED_OUTPATIENT_CLINIC_OR_DEPARTMENT_OTHER)
Admission: EM | Admit: 2012-06-29 | Discharge: 2012-06-29 | Disposition: A | Discharge status: Home / Self Care | Payer: 59 | Attending: Emergency Medicine | Admitting: Emergency Medicine

## 2012-06-29 ENCOUNTER — Encounter (HOSPITAL_BASED_OUTPATIENT_CLINIC_OR_DEPARTMENT_OTHER): Payer: Self-pay | Admitting: Family Medicine

## 2012-06-29 DIAGNOSIS — M79673 Pain in unspecified foot: Secondary | ICD-10-CM

## 2012-06-29 DIAGNOSIS — F3289 Other specified depressive episodes: Secondary | ICD-10-CM | POA: Insufficient documentation

## 2012-06-29 DIAGNOSIS — F329 Major depressive disorder, single episode, unspecified: Secondary | ICD-10-CM | POA: Insufficient documentation

## 2012-06-29 DIAGNOSIS — F411 Generalized anxiety disorder: Secondary | ICD-10-CM | POA: Insufficient documentation

## 2012-06-29 DIAGNOSIS — M79609 Pain in unspecified limb: Secondary | ICD-10-CM | POA: Insufficient documentation

## 2012-06-29 DIAGNOSIS — Z862 Personal history of diseases of the blood and blood-forming organs and certain disorders involving the immune mechanism: Secondary | ICD-10-CM | POA: Insufficient documentation

## 2012-06-29 DIAGNOSIS — Z79899 Other long term (current) drug therapy: Secondary | ICD-10-CM | POA: Insufficient documentation

## 2012-06-29 DIAGNOSIS — E282 Polycystic ovarian syndrome: Secondary | ICD-10-CM | POA: Insufficient documentation

## 2012-06-29 DIAGNOSIS — Z8739 Personal history of other diseases of the musculoskeletal system and connective tissue: Secondary | ICD-10-CM | POA: Insufficient documentation

## 2012-06-29 DIAGNOSIS — Z8639 Personal history of other endocrine, nutritional and metabolic disease: Secondary | ICD-10-CM | POA: Insufficient documentation

## 2012-06-29 DIAGNOSIS — Z8709 Personal history of other diseases of the respiratory system: Secondary | ICD-10-CM | POA: Insufficient documentation

## 2012-06-29 MED ORDER — OXYCODONE-ACETAMINOPHEN 5-325 MG PO TABS: 1.0000 | ORAL_TABLET | Freq: Four times a day (QID) | ORAL | Status: DC | PRN

## 2012-06-29 NOTE — ED Provider Notes (Addendum)
History     CSN: [id number]  Arrival date & time 06/29/12  1050   First MD Initiated Contact with Patient 06/29/12 1204      Chief Complaint  Patient presents with  . Foot Pain    (Consider location/radiation/quality/duration/timing/severity/associated sxs/prior treatment) Patient is a 31 y.o. female presenting with lower extremity pain. The history is provided by the patient.  Foot Pain This is a recurrent problem. Episode onset: Worsening over the last 3-4 days. The problem occurs constantly. The problem has been gradually worsening. Associated symptoms comments: Pain in the left heel that radiates up into the ankle. The pain is a 9/10 and worse with walking. The symptoms are aggravated by walking and standing. The symptoms are relieved by rest. She has tried acetaminophen for the symptoms. The treatment provided no relief.    Past Medical History  Diagnosis Date  . Bronchitis   . Polycystic ovary syndrome   . Anxiety   . Depression     Past Surgical History  Procedure Date  . Tubal ligation   . Hernia repair   . Plantar fascia surgery     No family history on file.  History  Substance Use Topics  . Smoking status: Never Smoker   . Smokeless tobacco: Not on file  . Alcohol Use: No    OB History    Grav Para Term Preterm Abortions TAB SAB Ect Mult Living                  Review of Systems  All other systems reviewed and are negative.    Allergies  Hydrocodone  Home Medications   Current Outpatient Rx  Name Route Sig Dispense Refill  . ACETAMINOPHEN 325 MG PO TABS Oral Take 650 mg by mouth every 6 (six) hours as needed. Patient uses this medication for foot pain.    . ALBUTEROL SULFATE HFA 108 (90 BASE) MCG/ACT IN AERS Inhalation Inhale 2 puffs into the lungs every 4 (four) hours as needed for wheezing. 1 Inhaler 0  . CINNAMON 500 MG PO CAPS Oral Take 500 mg by mouth daily.      . OMEGA-3 FATTY ACIDS 1000 MG PO CAPS Oral Take 2 g by mouth daily. Hold  while in hospital     . FLUTICASONE PROPIONATE 50 MCG/ACT NA SUSP Nasal Place 2 sprays into the nose daily. 16 g 2  . METFORMIN HCL 500 MG PO TABS Oral Take 500 mg by mouth 2 (two) times daily with a meal.     . MULTIVITAMINS PO TABS Oral Take 1 tablet by mouth daily.      Marland Kitchen NAPROXEN 500 MG PO TABS Oral Take 1 tablet (500 mg total) by mouth 2 (two) times daily. 30 tablet 0  . OMEPRAZOLE 10 MG PO CPDR Oral Take 10 mg by mouth daily.        BP 125/76  Pulse 60  Temp 98.3 F (36.8 C) (Oral)  Resp 18  Ht 5\' 6"  (1.676 m)  Wt 193 lb (87.544 kg)  BMI 31.15 kg/m2  SpO2 100%  Physical Exam  Nursing note and vitals reviewed. Constitutional: She is oriented to person, place, and time. She appears well-developed and well-nourished. No distress.  HENT:  Head: Normocephalic and atraumatic.  Musculoskeletal: She exhibits tenderness.       Left foot: She exhibits tenderness and swelling. She exhibits no crepitus and no deformity.       Feet:  Neurological: She is alert and oriented to person,  place, and time.  Skin: Skin is warm and dry. No rash noted. No erythema.  Psychiatric: She has a normal mood and affect. Her behavior is normal.    ED Course  Procedures (including critical care time)  Labs Reviewed - No data to display No results found.   1. Foot pain       MDM   Patient with a history of plantar fasciitis who has been under the care of a podiatrist and had a surgery done in September for the plantar fasciitis however does not improved. She has an appointment to see the podiatrist on Thursday but states the pain is so bad today she cannot walk. She states she used to have a walking boot but when she was cleared she threw it away. Patient is also not allowed to take any anti-inflammatories 9 weeks post surgery. Tylenol is not improving her symptoms. Patient was placed in a Cam Walker and given Percocet. She has no signs concerning for infection at this time        Gwyneth Sprout, MD 06/29/12 1230  Gwyneth Sprout, MD 06/29/12 1232

## 2012-06-29 NOTE — ED Notes (Signed)
Pt c/o left heel pain for "weeks" and reports having surgery for "plantar fascitis" in Sept. Pt sts she has appt to see podiatrist on Thursday but pain is too great to wait until then.

## 2012-06-29 NOTE — ED Notes (Signed)
Patient reports surgery on left foot on 05/11/12.  Patient reports increased pain and unable to see her doctor today.

## 2012-07-06 ENCOUNTER — Inpatient Hospital Stay (HOSPITAL_COMMUNITY): Payer: 59

## 2012-07-06 ENCOUNTER — Encounter (HOSPITAL_COMMUNITY): Payer: Self-pay | Admitting: *Deleted

## 2012-07-06 ENCOUNTER — Inpatient Hospital Stay (HOSPITAL_COMMUNITY)
Admission: AD | Admit: 2012-07-06 | Discharge: 2012-07-06 | Disposition: A | Discharge status: Hospice | Payer: 59 | Source: Ambulatory Visit | Attending: Obstetrics & Gynecology | Admitting: Obstetrics & Gynecology

## 2012-07-06 DIAGNOSIS — N938 Other specified abnormal uterine and vaginal bleeding: Secondary | ICD-10-CM

## 2012-07-06 DIAGNOSIS — R109 Unspecified abdominal pain: Secondary | ICD-10-CM | POA: Insufficient documentation

## 2012-07-06 DIAGNOSIS — N949 Unspecified condition associated with female genital organs and menstrual cycle: Secondary | ICD-10-CM | POA: Insufficient documentation

## 2012-07-06 LAB — URINALYSIS, ROUTINE W REFLEX MICROSCOPIC
Glucose, UA: NEGATIVE mg/dL
Leukocytes, UA: NEGATIVE
Nitrite: NEGATIVE
Protein, ur: NEGATIVE mg/dL

## 2012-07-06 LAB — WET PREP, GENITAL
Clue Cells Wet Prep HPF POC: NONE SEEN
Yeast Wet Prep HPF POC: NONE SEEN

## 2012-07-06 LAB — CBC
HCT: 38.5 % (ref 36.0–46.0)
Hemoglobin: 12.7 g/dL (ref 12.0–15.0)
WBC: 9.8 10*3/uL (ref 4.0–10.5)

## 2012-07-06 LAB — URINE MICROSCOPIC-ADD ON

## 2012-07-06 LAB — POCT PREGNANCY, URINE: Preg Test, Ur: NEGATIVE

## 2012-07-06 MED ORDER — NORGESTIMATE-ETH ESTRADIOL 0.25-35 MG-MCG PO TABS: 1.0000 | ORAL_TABLET | Freq: Every day | ORAL | Status: DC

## 2012-07-06 MED ORDER — TRAMADOL HCL 50 MG PO TABS: 50.0000 mg | ORAL_TABLET | Freq: Four times a day (QID) | ORAL | Status: DC | PRN

## 2012-07-06 MED ORDER — KETOROLAC TROMETHAMINE 60 MG/2ML IM SOLN
60.0000 mg | Freq: Once | INTRAMUSCULAR | Status: AC
  Administered 2012-07-06: 60 mg via INTRAMUSCULAR
  Filled 2012-07-06: qty 2

## 2012-07-06 NOTE — MAU Provider Note (Signed)
History     CSN: [id number]  Arrival date and time: 07/06/12 1543   First Provider Initiated Contact with Patient 07/06/12 1757      Chief Complaint  Patient presents with  . Vaginal Discharge  . Abdominal Pain   HPI Stacy Deleon is 31 y.o. [phone] presents with left sided  pelvic and vaginal pressure on and off for 3 weeks.  LMP 07/03/12.  States she has had irregular period for over 7 years, began after BTL.  States bleeding now is "dark and dry".  Hx of PCOS and ovarian cysts.  Sexually active X 1 partner for 10years.   States vaginal discharge always has an odor.      Past Medical History  Diagnosis Date  . Bronchitis   . Polycystic ovary syndrome   . Anxiety   . Depression     Past Surgical History  Procedure Date  . Tubal ligation   . Hernia repair   . Plantar fascia surgery     History reviewed. No pertinent family history.  History  Substance Use Topics  . Smoking status: Former Smoker -- 2.0 packs/day for 3 years    Types: Cigarettes    Quit date: 07/06/2000  . Smokeless tobacco: Not on file  . Alcohol Use: No    Allergies:  Allergies  Allergen Reactions  . Hydrocodone Itching    Severe itching    Prescriptions prior to admission  Medication Sig Dispense Refill  . acetaminophen (TYLENOL) 325 MG tablet Take 650 mg by mouth every 6 (six) hours as needed. Patient uses this medication for foot pain.      Marland Kitchen albuterol (PROVENTIL HFA;VENTOLIN HFA) 108 (90 BASE) MCG/ACT inhaler Inhale 2 puffs into the lungs every 6 (six) hours as needed. Rescue inhaler      . metFORMIN (GLUCOPHAGE) 500 MG tablet Take 500 mg by mouth 2 (two) times daily with a meal.         Review of Systems  Respiratory: Negative.   Cardiovascular: Negative.   Gastrointestinal: Positive for abdominal pain (left lower quadrant).  Genitourinary: Negative for dysuria, urgency and frequency.       + vaginal pressure.  Hx of abnormal menstrual blood   Physical Exam   Blood pressure  123/74, pulse 70, temperature 98.5 F (36.9 C), temperature source Oral, resp. rate 18, height 5\' 5"  (1.651 m), weight 103.057 kg (227 lb 3.2 oz), last menstrual period 07/03/2012, SpO2 100.00%.  Physical Exam  Constitutional: She is oriented to person, place, and time. She appears well-developed and well-nourished. No distress.  HENT:  Head: Normocephalic.  Neck: Normal range of motion.  Cardiovascular: Normal rate.   Respiratory: Effort normal.  GI: Soft. She exhibits no distension and no mass. There is no tenderness. There is no rebound and no guarding.  Genitourinary: There is no tenderness or lesion on the right labia. There is no tenderness or lesion on the left labia. Uterus is tender (mildly). Uterus is not enlarged. Cervix exhibits no motion tenderness, no discharge and no friability. Right adnexum displays no mass, no tenderness and no fullness. Left adnexum displays tenderness. Left adnexum displays no mass and no fullness. There is bleeding (small amount of light red blood with odor) around the vagina. Vaginal discharge (malodorous) found.  Neurological: She is alert and oriented to person, place, and time.  Skin: Skin is warm and dry.  Psychiatric: She has a normal mood and affect. Her behavior is normal.   Results for orders placed during the  hospital encounter of 07/06/12 (from the past 24 hour(s))  URINALYSIS, ROUTINE W REFLEX MICROSCOPIC     Status: Abnormal   Collection Time   07/06/12  4:05 PM      Component Value Range   Color, Urine YELLOW  YELLOW   APPearance CLEAR  CLEAR   Specific Gravity, Urine 1.015  1.005 - 1.030   pH 6.5  5.0 - 8.0   Glucose, UA NEGATIVE  NEGATIVE mg/dL   Hgb urine dipstick MODERATE (*) NEGATIVE   Bilirubin Urine NEGATIVE  NEGATIVE   Ketones, ur NEGATIVE  NEGATIVE mg/dL   Protein, ur NEGATIVE  NEGATIVE mg/dL   Urobilinogen, UA 0.2  0.0 - 1.0 mg/dL   Nitrite NEGATIVE  NEGATIVE   Leukocytes, UA NEGATIVE  NEGATIVE  URINE MICROSCOPIC-ADD ON      Status: Abnormal   Collection Time   07/06/12  4:05 PM      Component Value Range   Squamous Epithelial / LPF FEW (*) RARE   RBC / HPF 0-2  <3 RBC/hpf   Bacteria, UA RARE  RARE  POCT PREGNANCY, URINE     Status: Normal   Collection Time   07/06/12  4:48 PM      Component Value Range   Preg Test, Ur NEGATIVE  NEGATIVE  CBC     Status: Normal   Collection Time   07/06/12  6:04 PM      Component Value Range   WBC 9.8  4.0 - 10.5 K/uL   RBC 4.55  3.87 - 5.11 MIL/uL   Hemoglobin 12.7  12.0 - 15.0 g/dL   HCT 16.1  09.6 - 04.5 %   MCV 84.6  78.0 - 100.0 fL   MCH 27.9  26.0 - 34.0 pg   MCHC 33.0  30.0 - 36.0 g/dL   RDW 40.9  81.1 - 91.4 %   Platelets 334  150 - 400 K/uL   MAU Course  Procedures  GC/CHL culture to lab  MDM 19:10  Care turned over to Hilda Lias, CNM  Assessment and Plan  A:  Lower abdominal pain      Abnormal vaginal bleeding     Hx of PCOS  P  Refer to [facility] for further evaluation and pap.  KEY,EVE M 07/06/2012, 5:58 PM  US Pelvis Complete  07/06/2012  *RADIOLOGY REPORT*  Clinical Data: 31 year old female with bleeding pain and pressure.  TRANSABDOMINAL AND TRANSVAGINAL ULTRASOUND OF PELVIS Technique:  Both transabdominal and transvaginal ultrasound examinations of the pelvis were performed. Transabdominal technique was performed for global imaging of the pelvis including uterus, ovaries, adnexal regions, and pelvic cul-de-sac.  It was necessary to proceed with endovaginal exam following the transabdominal exam to visualize the adnexa.  Comparison:  07/01/2008.  CT pelvis 10/25/2007.  Findings:  Uterus: Normal measuring 7.5 x 3.8 x 4.8 cm.  Endometrium: Diminutive, 3-4 mm in thickness.  Right ovary:  Normal with multiple small follicles.  3.3 x 2.4 x 2.3 cm.  Left ovary: Normal with a small follicles.  2.5 x 1.9 x 2.3 cm.  Other findings: No free fluid  IMPRESSION: Normal study. No evidence of pelvic mass or other significant abnormality.   Original Report Authenticated  By: Erskine Speed, M.D.      Discussed findings.  Patient is concerned she has endometriosis. Discussed cannot know that without surgery.   Offered OCPs and pt would like to start them to get some improvement. She wants to talk to MD about laparoscopy.  Reviewed  this is probably anovulatory bleeding and/or PCOS.   Will give Rx Tramadol for pain and Sprintec OCPs. Will have clinic call her with an appointment. Not interested in Mirena.

## 2012-07-06 NOTE — MAU Note (Signed)
Patient states she has a history of PCOS and has episodes of irregular cycles. Has had bleeding on an off for entire month of October. Started bleeding on 11-1 and has turned dark, almost black and having lower abdominal pressure with increasing periods of pain. Patient states she had a BTL in 2005.

## 2012-07-07 ENCOUNTER — Encounter: Payer: Self-pay | Admitting: Obstetrics & Gynecology

## 2012-07-07 LAB — GC/CHLAMYDIA PROBE AMP, GENITAL: Chlamydia, DNA Probe: NEGATIVE

## 2012-07-14 NOTE — MAU Provider Note (Signed)
Medical Screening exam and patient care preformed by advanced practice provider.  Agree with the above management.  

## 2012-07-22 ENCOUNTER — Encounter: Payer: 59 | Admitting: Obstetrics & Gynecology

## 2012-09-05 ENCOUNTER — Encounter (HOSPITAL_BASED_OUTPATIENT_CLINIC_OR_DEPARTMENT_OTHER): Payer: Self-pay | Admitting: *Deleted

## 2012-09-05 ENCOUNTER — Emergency Department (HOSPITAL_BASED_OUTPATIENT_CLINIC_OR_DEPARTMENT_OTHER)
Admission: EM | Admit: 2012-09-05 | Discharge: 2012-09-05 | Disposition: A | Discharge status: Home / Self Care | Payer: 59 | Attending: Emergency Medicine | Admitting: Emergency Medicine

## 2012-09-05 DIAGNOSIS — R110 Nausea: Secondary | ICD-10-CM | POA: Insufficient documentation

## 2012-09-05 DIAGNOSIS — R059 Cough, unspecified: Secondary | ICD-10-CM | POA: Insufficient documentation

## 2012-09-05 DIAGNOSIS — Z87891 Personal history of nicotine dependence: Secondary | ICD-10-CM | POA: Insufficient documentation

## 2012-09-05 DIAGNOSIS — J069 Acute upper respiratory infection, unspecified: Secondary | ICD-10-CM | POA: Insufficient documentation

## 2012-09-05 DIAGNOSIS — R05 Cough: Secondary | ICD-10-CM | POA: Insufficient documentation

## 2012-09-05 DIAGNOSIS — R0982 Postnasal drip: Secondary | ICD-10-CM | POA: Insufficient documentation

## 2012-09-05 DIAGNOSIS — Z8659 Personal history of other mental and behavioral disorders: Secondary | ICD-10-CM | POA: Insufficient documentation

## 2012-09-05 DIAGNOSIS — Z79899 Other long term (current) drug therapy: Secondary | ICD-10-CM | POA: Insufficient documentation

## 2012-09-05 DIAGNOSIS — J988 Other specified respiratory disorders: Secondary | ICD-10-CM

## 2012-09-05 DIAGNOSIS — R6883 Chills (without fever): Secondary | ICD-10-CM | POA: Insufficient documentation

## 2012-09-05 DIAGNOSIS — Z8709 Personal history of other diseases of the respiratory system: Secondary | ICD-10-CM | POA: Insufficient documentation

## 2012-09-05 DIAGNOSIS — Z8742 Personal history of other diseases of the female genital tract: Secondary | ICD-10-CM | POA: Insufficient documentation

## 2012-09-05 DIAGNOSIS — R51 Headache: Secondary | ICD-10-CM | POA: Insufficient documentation

## 2012-09-05 MED ORDER — TRAMADOL HCL 50 MG PO TABS: 50.0000 mg | ORAL_TABLET | Freq: Four times a day (QID) | ORAL | Status: DC | PRN

## 2012-09-05 MED ORDER — AMOXICILLIN 500 MG PO CAPS
1000.0000 mg | ORAL_CAPSULE | Freq: Once | ORAL | Status: AC
  Administered 2012-09-05: 1000 mg via ORAL
  Filled 2012-09-05: qty 2

## 2012-09-05 MED ORDER — AMOXICILLIN 500 MG PO CAPS: 1000.0000 mg | ORAL_CAPSULE | Freq: Two times a day (BID) | ORAL | Status: DC

## 2012-09-05 MED ORDER — ONDANSETRON HCL 4 MG PO TABS: 4.0000 mg | ORAL_TABLET | Freq: Four times a day (QID) | ORAL | Status: DC | PRN

## 2012-09-05 MED ORDER — ALBUTEROL SULFATE HFA 108 (90 BASE) MCG/ACT IN AERS: 2.0000 | INHALATION_SPRAY | RESPIRATORY_TRACT | Status: DC | PRN

## 2012-09-05 NOTE — ED Provider Notes (Signed)
History   This chart was scribed for Dione Booze, MD scribed by Magnus Sinning. The patient was seen in room MH01/MH01 at 15:12   CSN: [id number]  Arrival date & time 09/05/12  1448    Chief Complaint  Patient presents with  . URI    (Consider location/radiation/quality/duration/timing/severity/associated sxs/prior treatment) Patient is a 32 y.o. female presenting with URI. The history is provided by the patient. No language interpreter was used.  URI The primary symptoms include headaches, cough and nausea. Primary symptoms do not include vomiting.  Symptoms associated with the illness include chills, sinus pressure and congestion.   Stacy Deleon is a 32 y.o. female who presents to the Emergency Department complaining of gradually worsened constant head congestion with associated nasal drainage, nausea, productive cough with yellow phlegm, chills, sweats, and HA with all sxs ongoing for three days.   The patient explains she began having head congestion and clear nasal drainage two weeks ago, but she states it went away for three days and sxs were relieved, but notes sxs returned three days later with additional worsened congestion, nausea, HA, nasal drainage, chills, and sweats.  The pt is unsure of fevers and currently rates pain a 7 of 10.  She denies emesis, or sick contact exposure, and states that she had to have flu shot because son has cystic fibrosis. She additionally provides that she has a rx for an inhaler, although she states she has not used it because she has been unable to find it.   PCP: Dr. Holley Bouche Past Medical History  Diagnosis Date  . Bronchitis   . Polycystic ovary syndrome   . Anxiety   . Depression     Past Surgical History  Procedure Date  . Tubal ligation   . Hernia repair   . Plantar fascia surgery     History reviewed. No pertinent family history.  History  Substance Use Topics  . Smoking status: Former Smoker -- 2.0 packs/day for 3  years    Types: Cigarettes    Quit date: 07/06/2000  . Smokeless tobacco: Not on file  . Alcohol Use: No    OB History    Grav Para Term Preterm Abortions TAB SAB Ect Mult Living   3 3 3       3       Review of Systems  Constitutional: Positive for chills.  HENT: Positive for congestion, postnasal drip and sinus pressure.   Respiratory: Positive for cough.   Gastrointestinal: Positive for nausea. Negative for vomiting.  Neurological: Positive for headaches.  All other systems reviewed and are negative.    Allergies  Hydrocodone  Home Medications   Current Outpatient Rx  Name  Route  Sig  Dispense  Refill  . ACETAMINOPHEN 325 MG PO TABS   Oral   Take 650 mg by mouth every 6 (six) hours as needed. Patient uses this medication for foot pain.         . ALBUTEROL SULFATE HFA 108 (90 BASE) MCG/ACT IN AERS   Inhalation   Inhale 2 puffs into the lungs every 6 (six) hours as needed. Rescue inhaler         . METFORMIN HCL 500 MG PO TABS   Oral   Take 500 mg by mouth 2 (two) times daily with a meal.          . NORGESTIMATE-ETH ESTRADIOL 0.25-35 MG-MCG PO TABS   Oral   Take 1 tablet by mouth daily.   1 Package  11   . TRAMADOL HCL 50 MG PO TABS   Oral   Take 1 tablet (50 mg total) by mouth every 6 (six) hours as needed for pain.   30 tablet   1     BP 133/73  Pulse 67  Temp 98.9 F (37.2 C) (Oral)  Resp 16  Ht 5\' 6"  (1.676 m)  Wt 220 lb (99.791 kg)  BMI 35.51 kg/m2  SpO2 100%  LMP 08/28/2012  Physical Exam  Nursing note and vitals reviewed. Constitutional: She is oriented to person, place, and time. She appears well-developed and well-nourished. No distress.  HENT:  Head: Normocephalic and atraumatic.  Mouth/Throat: Oropharynx is clear and moist. No oropharyngeal exudate.       Mild erythema of both tympanic membranes and mild erythema  of the oropharynx   Eyes: Conjunctivae normal and EOM are normal.  Neck: Neck supple. No tracheal deviation  present.  Cardiovascular: Normal rate, regular rhythm and normal heart sounds.   Pulmonary/Chest: Effort normal. No respiratory distress. She has no wheezes. She has no rales.  Abdominal: She exhibits no distension.  Musculoskeletal: Normal range of motion.  Neurological: She is alert and oriented to person, place, and time. No sensory deficit.  Skin: Skin is warm and dry.  Psychiatric: She has a normal mood and affect. Her behavior is normal.    ED Course  Procedures (including critical care time) DIAGNOSTIC STUDIES: Oxygen Saturation is 100% on room air, normal by my interpretation.    COORDINATION OF CARE:  15:13: Physical exam performed     1. Respiratory tract infection       MDM  Respiratory tract infection. Her clinical course suggests an initial viral infection with bacterial superinfection. Therefore, she'll be started on antibiotics and is given a prescription for amoxicillin. He's also given new prescriptions for her albuterol inhaler and tramadol, which had run out.  I personally performed the services described in this documentation, which was scribed in my presence. The recorded information has been reviewed and is accurate.          Dione Booze, MD 09/05/12 1524

## 2012-09-05 NOTE — ED Notes (Signed)
Pt c/o uri symptoms x 3 days 

## 2012-09-05 NOTE — ED Notes (Signed)
D/c home with rx x 4 for tramadol, zofran, amoxicillin and albuterol

## 2012-12-22 ENCOUNTER — Other Ambulatory Visit: Payer: Self-pay | Admitting: Family Medicine

## 2012-12-22 ENCOUNTER — Ambulatory Visit
Admission: RE | Admit: 2012-12-22 | Discharge: 2012-12-22 | Disposition: A | Discharge status: Home / Self Care | Payer: 59 | Source: Ambulatory Visit | Attending: Family Medicine | Admitting: Family Medicine

## 2012-12-22 DIAGNOSIS — M545 Low back pain, unspecified: Secondary | ICD-10-CM

## 2013-03-23 ENCOUNTER — Emergency Department (HOSPITAL_BASED_OUTPATIENT_CLINIC_OR_DEPARTMENT_OTHER)
Admission: EM | Admit: 2013-03-23 | Discharge: 2013-03-23 | Disposition: A | Discharge status: Home / Self Care | Payer: 59 | Attending: Emergency Medicine | Admitting: Emergency Medicine

## 2013-03-23 ENCOUNTER — Encounter (HOSPITAL_BASED_OUTPATIENT_CLINIC_OR_DEPARTMENT_OTHER): Payer: Self-pay | Admitting: *Deleted

## 2013-03-23 DIAGNOSIS — F411 Generalized anxiety disorder: Secondary | ICD-10-CM | POA: Insufficient documentation

## 2013-03-23 DIAGNOSIS — F3289 Other specified depressive episodes: Secondary | ICD-10-CM | POA: Insufficient documentation

## 2013-03-23 DIAGNOSIS — F909 Attention-deficit hyperactivity disorder, unspecified type: Secondary | ICD-10-CM | POA: Insufficient documentation

## 2013-03-23 DIAGNOSIS — Z792 Long term (current) use of antibiotics: Secondary | ICD-10-CM | POA: Insufficient documentation

## 2013-03-23 DIAGNOSIS — G43909 Migraine, unspecified, not intractable, without status migrainosus: Secondary | ICD-10-CM | POA: Insufficient documentation

## 2013-03-23 DIAGNOSIS — Z8742 Personal history of other diseases of the female genital tract: Secondary | ICD-10-CM | POA: Insufficient documentation

## 2013-03-23 DIAGNOSIS — F329 Major depressive disorder, single episode, unspecified: Secondary | ICD-10-CM | POA: Insufficient documentation

## 2013-03-23 DIAGNOSIS — J40 Bronchitis, not specified as acute or chronic: Secondary | ICD-10-CM | POA: Insufficient documentation

## 2013-03-23 DIAGNOSIS — R110 Nausea: Secondary | ICD-10-CM | POA: Insufficient documentation

## 2013-03-23 DIAGNOSIS — Z87891 Personal history of nicotine dependence: Secondary | ICD-10-CM | POA: Insufficient documentation

## 2013-03-23 DIAGNOSIS — Z79899 Other long term (current) drug therapy: Secondary | ICD-10-CM | POA: Insufficient documentation

## 2013-03-23 HISTORY — DX: Attention-deficit hyperactivity disorder, unspecified type: F90.9

## 2013-03-23 MED ORDER — SODIUM CHLORIDE 0.9 % IV SOLN
Freq: Once | INTRAVENOUS | Status: AC
  Administered 2013-03-23: 15:00:00 via INTRAVENOUS

## 2013-03-23 MED ORDER — DEXAMETHASONE SODIUM PHOSPHATE 10 MG/ML IJ SOLN
10.0000 mg | Freq: Once | INTRAMUSCULAR | Status: AC
  Administered 2013-03-23: 10 mg via INTRAVENOUS
  Filled 2013-03-23: qty 1

## 2013-03-23 MED ORDER — METOCLOPRAMIDE HCL 5 MG/ML IJ SOLN
10.0000 mg | Freq: Once | INTRAMUSCULAR | Status: AC
  Administered 2013-03-23: 10 mg via INTRAVENOUS
  Filled 2013-03-23: qty 2

## 2013-03-23 MED ORDER — DIPHENHYDRAMINE HCL 50 MG/ML IJ SOLN
25.0000 mg | Freq: Once | INTRAMUSCULAR | Status: AC
  Administered 2013-03-23: 25 mg via INTRAVENOUS
  Filled 2013-03-23: qty 1

## 2013-03-23 MED ORDER — KETOROLAC TROMETHAMINE 30 MG/ML IJ SOLN
30.0000 mg | Freq: Once | INTRAMUSCULAR | Status: AC
  Administered 2013-03-23: 30 mg via INTRAVENOUS
  Filled 2013-03-23: qty 1

## 2013-03-23 NOTE — ED Provider Notes (Signed)
History    CSN: [id number] Arrival date & time 03/23/13  1351  First MD Initiated Contact with Patient 03/23/13 1402     Chief Complaint  Patient presents with  . Headache   (Consider location/radiation/quality/duration/timing/severity/associated sxs/prior Treatment) HPI Comments: Patient presents with complaints of headache for the past 5 days.  She reports "floaters" in her vision in both eyes.  She feels nauseated but no vomiting.  No fevers or chills.  No stiff neck.  Patient is a 32 y.o. female presenting with headaches. The history is provided by the patient.  Headache Pain location:  R parietal Quality:  Stabbing Radiates to:  Does not radiate Onset quality:  Gradual Duration:  5 days Timing:  Constant Progression:  Worsening Chronicity:  Recurrent Similar to prior headaches: yes   Context: activity and bright light   Relieved by:  Nothing Worsened by:  Nothing tried Ineffective treatments: excedrin migraine. Associated symptoms: nausea and visual change   Associated symptoms: no fever    Past Medical History  Diagnosis Date  . Bronchitis   . Polycystic ovary syndrome   . Anxiety   . Depression   . ADHD (attention deficit hyperactivity disorder)    Past Surgical History  Procedure Laterality Date  . Tubal ligation    . Hernia repair    . Plantar fascia surgery     No family history on file. History  Substance Use Topics  . Smoking status: Former Smoker -- 2.00 packs/day for 3 years    Types: Cigarettes    Quit date: 07/06/2000  . Smokeless tobacco: Not on file  . Alcohol Use: No   OB History   Grav Para Term Preterm Abortions TAB SAB Ect Mult Living   3 3 3       3      Review of Systems  Constitutional: Negative for fever.  Gastrointestinal: Positive for nausea.  Neurological: Positive for headaches.  All other systems reviewed and are negative.    Allergies  Hydrocodone  Home Medications   Current Outpatient Rx  Name  Route  Sig   Dispense  Refill  . Lisdexamfetamine Dimesylate (VYVANSE PO)   Oral   Take by mouth.         Marland Kitchen acetaminophen (TYLENOL) 325 MG tablet   Oral   Take 650 mg by mouth every 6 (six) hours as needed. Patient uses this medication for foot pain.         Marland Kitchen albuterol (PROVENTIL HFA;VENTOLIN HFA) 108 (90 BASE) MCG/ACT inhaler   Inhalation   Inhale 2 puffs into the lungs every 6 (six) hours as needed. Rescue inhaler         . albuterol (PROVENTIL HFA;VENTOLIN HFA) 108 (90 BASE) MCG/ACT inhaler   Inhalation   Inhale 2 puffs into the lungs every 4 (four) hours as needed for wheezing or shortness of breath (or coughing).   1 Inhaler   0   . amoxicillin (AMOXIL) 500 MG capsule   Oral   Take 2 capsules (1,000 mg total) by mouth 2 (two) times daily.   40 capsule   0   . metFORMIN (GLUCOPHAGE) 500 MG tablet   Oral   Take 500 mg by mouth 2 (two) times daily with a meal.          . norgestimate-ethinyl estradiol (ORTHO-CYCLEN,SPRINTEC,PREVIFEM) 0.25-35 MG-MCG tablet   Oral   Take 1 tablet by mouth daily.   1 Package   11   . ondansetron (ZOFRAN) 4 MG tablet  Oral   Take 1 tablet (4 mg total) by mouth every 6 (six) hours as needed for nausea.   20 tablet   0   . traMADol (ULTRAM) 50 MG tablet   Oral   Take 1 tablet (50 mg total) by mouth every 6 (six) hours as needed for pain.   30 tablet   1   . traMADol (ULTRAM) 50 MG tablet   Oral   Take 1 tablet (50 mg total) by mouth every 6 (six) hours as needed for pain.   15 tablet   0    BP 115/78  Pulse 94  Temp(Src) 98.6 F (37 C) (Oral)  Resp 20  Wt 200 lb (90.719 kg)  BMI 32.3 kg/m2  SpO2 100% Physical Exam  Nursing note and vitals reviewed. Constitutional: She is oriented to person, place, and time. She appears well-developed and well-nourished. No distress.  HENT:  Head: Normocephalic and atraumatic.  Mouth/Throat: Oropharynx is clear and moist.  Eyes: EOM are normal. Pupils are equal, round, and reactive to  light.  No papilledema on fundoscopic exam.  Neck: Normal range of motion. Neck supple.  Cardiovascular: Normal rate.   No murmur heard. Pulmonary/Chest: Effort normal and breath sounds normal.  Musculoskeletal: Normal range of motion.  Lymphadenopathy:    She has no cervical adenopathy.  Neurological: She is alert and oriented to person, place, and time. No cranial nerve deficit. She exhibits normal muscle tone. Coordination normal.  Skin: Skin is warm. She is not diaphoretic.    ED Course  Procedures (including critical care time) Labs Reviewed - No data to display No results found. No diagnosis found.  MDM  She feels better with fluids, meds.  To return prn.  Geoffery Lyons, MD 03/23/13 [phone]

## 2013-03-23 NOTE — ED Notes (Signed)
MD at bedside. 

## 2013-03-23 NOTE — ED Notes (Signed)
Headache x 5 days. Visual disturbances and light sensative. Nausea.

## 2013-04-08 ENCOUNTER — Emergency Department (HOSPITAL_COMMUNITY): Payer: 59

## 2013-04-08 ENCOUNTER — Encounter (HOSPITAL_COMMUNITY): Payer: Self-pay | Admitting: Emergency Medicine

## 2013-04-08 ENCOUNTER — Emergency Department (HOSPITAL_COMMUNITY)
Admission: EM | Admit: 2013-04-08 | Discharge: 2013-04-08 | Disposition: A | Discharge status: Home / Self Care | Payer: 59 | Attending: Emergency Medicine | Admitting: Emergency Medicine

## 2013-04-08 DIAGNOSIS — N898 Other specified noninflammatory disorders of vagina: Secondary | ICD-10-CM | POA: Insufficient documentation

## 2013-04-08 DIAGNOSIS — Z8659 Personal history of other mental and behavioral disorders: Secondary | ICD-10-CM | POA: Insufficient documentation

## 2013-04-08 DIAGNOSIS — F3289 Other specified depressive episodes: Secondary | ICD-10-CM | POA: Insufficient documentation

## 2013-04-08 DIAGNOSIS — F909 Attention-deficit hyperactivity disorder, unspecified type: Secondary | ICD-10-CM | POA: Insufficient documentation

## 2013-04-08 DIAGNOSIS — Z8709 Personal history of other diseases of the respiratory system: Secondary | ICD-10-CM | POA: Insufficient documentation

## 2013-04-08 DIAGNOSIS — Z79899 Other long term (current) drug therapy: Secondary | ICD-10-CM | POA: Insufficient documentation

## 2013-04-08 DIAGNOSIS — G8929 Other chronic pain: Secondary | ICD-10-CM | POA: Insufficient documentation

## 2013-04-08 DIAGNOSIS — Z3202 Encounter for pregnancy test, result negative: Secondary | ICD-10-CM | POA: Insufficient documentation

## 2013-04-08 DIAGNOSIS — Z87891 Personal history of nicotine dependence: Secondary | ICD-10-CM | POA: Insufficient documentation

## 2013-04-08 DIAGNOSIS — Z9851 Tubal ligation status: Secondary | ICD-10-CM | POA: Insufficient documentation

## 2013-04-08 DIAGNOSIS — R110 Nausea: Secondary | ICD-10-CM | POA: Insufficient documentation

## 2013-04-08 DIAGNOSIS — E282 Polycystic ovarian syndrome: Secondary | ICD-10-CM | POA: Insufficient documentation

## 2013-04-08 DIAGNOSIS — F329 Major depressive disorder, single episode, unspecified: Secondary | ICD-10-CM | POA: Insufficient documentation

## 2013-04-08 DIAGNOSIS — Z8679 Personal history of other diseases of the circulatory system: Secondary | ICD-10-CM | POA: Insufficient documentation

## 2013-04-08 DIAGNOSIS — R109 Unspecified abdominal pain: Secondary | ICD-10-CM | POA: Insufficient documentation

## 2013-04-08 LAB — COMPREHENSIVE METABOLIC PANEL
AST: 20 U/L (ref 0–37)
Albumin: 3.8 g/dL (ref 3.5–5.2)
Chloride: 98 mEq/L (ref 96–112)
Creatinine, Ser: 0.84 mg/dL (ref 0.50–1.10)
Total Bilirubin: 0.3 mg/dL (ref 0.3–1.2)
Total Protein: 7.3 g/dL (ref 6.0–8.3)

## 2013-04-08 LAB — URINALYSIS, ROUTINE W REFLEX MICROSCOPIC
Glucose, UA: NEGATIVE mg/dL
Leukocytes, UA: NEGATIVE
Nitrite: NEGATIVE
Specific Gravity, Urine: 1.024 (ref 1.005–1.030)
pH: 7 (ref 5.0–8.0)

## 2013-04-08 LAB — CBC WITH DIFFERENTIAL/PLATELET
Basophils Absolute: 0.1 10*3/uL (ref 0.0–0.1)
Basophils Relative: 1 % (ref 0–1)
Eosinophils Absolute: 0.1 10*3/uL (ref 0.0–0.7)
HCT: 40.8 % (ref 36.0–46.0)
MCH: 28.3 pg (ref 26.0–34.0)
MCHC: 33.6 g/dL (ref 30.0–36.0)
Monocytes Absolute: 0.8 10*3/uL (ref 0.1–1.0)
Neutro Abs: 3.2 10*3/uL (ref 1.7–7.7)
Neutrophils Relative %: 45 % (ref 43–77)
RDW: 12.3 % (ref 11.5–15.5)

## 2013-04-08 LAB — POCT PREGNANCY, URINE: Preg Test, Ur: NEGATIVE

## 2013-04-08 LAB — LIPASE, BLOOD: Lipase: 29 U/L (ref 11–59)

## 2013-04-08 MED ORDER — DOCUSATE SODIUM 100 MG PO CAPS: 100.0000 mg | ORAL_CAPSULE | Freq: Two times a day (BID) | ORAL | Status: DC

## 2013-04-08 MED ORDER — OXYCODONE-ACETAMINOPHEN 5-325 MG PO TABS: 1.0000 | ORAL_TABLET | Freq: Four times a day (QID) | ORAL | Status: DC | PRN

## 2013-04-08 MED ORDER — IOHEXOL 300 MG/ML  SOLN
100.0000 mL | Freq: Once | INTRAMUSCULAR | Status: AC | PRN
  Administered 2013-04-08: 100 mL via INTRAVENOUS

## 2013-04-08 MED ORDER — MORPHINE SULFATE 4 MG/ML IJ SOLN
4.0000 mg | Freq: Once | INTRAMUSCULAR | Status: AC
  Administered 2013-04-08: 4 mg via INTRAVENOUS
  Filled 2013-04-08: qty 1

## 2013-04-08 MED ORDER — IOHEXOL 300 MG/ML  SOLN
50.0000 mL | Freq: Once | INTRAMUSCULAR | Status: AC | PRN
  Administered 2013-04-08: 50 mL via ORAL

## 2013-04-08 MED ORDER — ONDANSETRON HCL 4 MG PO TABS: 4.0000 mg | ORAL_TABLET | Freq: Four times a day (QID) | ORAL | Status: DC

## 2013-04-08 MED ORDER — DIPHENHYDRAMINE HCL 50 MG/ML IJ SOLN
25.0000 mg | Freq: Once | INTRAMUSCULAR | Status: AC
  Administered 2013-04-08: 25 mg via INTRAVENOUS
  Filled 2013-04-08: qty 1

## 2013-04-08 MED ORDER — ONDANSETRON HCL 4 MG/2ML IJ SOLN
4.0000 mg | Freq: Once | INTRAMUSCULAR | Status: AC
  Administered 2013-04-08: 4 mg via INTRAVENOUS
  Filled 2013-04-08: qty 2

## 2013-04-08 MED ORDER — SODIUM CHLORIDE 0.9 % IV SOLN
Freq: Once | INTRAVENOUS | Status: AC
  Administered 2013-04-08: 11:00:00 via INTRAVENOUS

## 2013-04-08 NOTE — ED Provider Notes (Signed)
Hydocodone causes itching, no hives, difficulty breathing, anaphalaxis  Layla Maw Agustus Mane, DO 04/08/13 1253

## 2013-04-08 NOTE — ED Notes (Signed)
Pt c/o gen abd pain, nauseated since yesterday morning.  Pt has hx of prolapsed bladder and "feminine issues" but her doctor told her that she wouldn't be in this much pain.

## 2013-04-08 NOTE — ED Provider Notes (Signed)
TIME SEEN: 10:01 AM  CHIEF COMPLAINT: Abdominal pain, nausea  HPI: Patient is a 32 y.o. G3P3 WF with history of PCOS, anxiety, depression, migraine headaches, prolapsed bladder who presents emergency Department with right-sided abdominal pain. Patient reports that she has had chronic lower abdominal pain that feels like a heaviness secondary to her history of prolapsed bladder. She is followed by Dr. Carrington Clamp at [facility]. She reports they feel her prolapsed bladder is due to having 3 children in close proximity. Patient states that yesterday afternoon her lower abdominal pain suddenly became worse and she began having shooting pains in her right upper and right lower quadrant pain. She states her pain is worse with pressing on her abdomen and better with pulling her legs up into her abdomen. She denies any association with food but does report anorexia. She has had nausea but no vomiting or diarrhea. No fever. No bloody stools or black tarry stools. No dysuria or hematuria. Patient has chronic white vaginal discharge and has been spotting for several weeks. She was seen by her OB/GYN today who performed a pelvic exam in a transvaginal ultrasound and then sent her here to the emergency department for further evaluation. Patient is sexually active with one partner. No history of sexually-transmitted diseases.  ROS: See HPI Constitutional: no fever  Eyes: no drainage  ENT: no runny nose   Cardiovascular:  no chest pain  Resp: no SOB  GI: no vomiting GU: no dysuria Integumentary: no rash  Allergy: no hives  Musculoskeletal: no leg swelling  Neurological: no slurred speech ROS otherwise negative  PAST MEDICAL HISTORY/PAST SURGICAL HISTORY:  Past Medical History  Diagnosis Date  . Bronchitis   . Polycystic ovary syndrome   . Anxiety   . Depression   . ADHD (attention deficit hyperactivity disorder)    prolapsed bladder, bilateral tubal ligation   MEDICATIONS:  Prior to  Admission medications   Medication Sig Start Date End Date Taking? Authorizing Provider  ARIPiprazole (ABILIFY) 5 MG tablet Take 5 mg by mouth every evening.   Yes Historical Provider, MD  ibuprofen (ADVIL,MOTRIN) 200 MG tablet Take 600 mg by mouth every 6 (six) hours as needed for pain.   Yes Historical Provider, MD  lisdexamfetamine (VYVANSE) 50 MG capsule Take 50 mg by mouth every morning.   Yes Historical Provider, MD  metFORMIN (GLUCOPHAGE) 500 MG tablet Take 1,000 mg by mouth 2 (two) times daily with a meal.   Yes Historical Provider, MD    ALLERGIES:  Allergies  Allergen Reactions  . Hydrocodone Itching    Severe itching    SOCIAL HISTORY:  History  Substance Use Topics  . Smoking status: Former Smoker -- 2.00 packs/day for 3 years    Types: Cigarettes    Quit date: 07/06/2000  . Smokeless tobacco: Not on file  . Alcohol Use: No    FAMILY HISTORY:  sister with a history of gallbladder disease  EXAM: BP 130/77  Pulse 89  Temp(Src) 98.9 F (37.2 C) (Oral)  Resp 20  SpO2 97% CONSTITUTIONAL: Alert and oriented and responds appropriately to questions. Well-appearing; well-nourished, obese  HEAD: Normocephalic EYES: Conjunctivae clear, PERRL ENT: normal nose; no rhinorrhea; moist mucous membranes; pharynx without lesions noted NECK: Supple, no meningismus, no LAD  CARD: RRR; S1 and S2 appreciated; no murmurs, no clicks, no rubs, no gallops RESP: Normal chest excursion without splinting or tachypnea; breath sounds clear and equal bilaterally; no wheezes, no rhonchi, no rales,  ABD/GI: Normal bowel sounds;  non-distended; soft,  tenderness palpation of her right upper quadrant region without guarding or rebound, negative Murphy sign, patient is very tearful when pressing her right upper abdomen BACK:  The back appears normal and is non-tender to palpation, there is no CVA tenderness EXT: Normal ROM in all joints; non-tender to palpation; no edema; normal capillary refill; no  cyanosis    SKIN: Normal color for age and race; warm NEURO: Moves all extremities equally PSYCH: The patient's mood and manner are appropriate. Grooming and personal hygiene are appropriate.  MEDICAL DECISION MAKING:  Patient with history of chronic pelvic pain who presents with new right sided abdominal pain. Today she has not had fever or vomiting and my suspicion for appendicitis is low. Patient does have tenderness diffusely across the lower abdomen but nothing striking at McBurney's point. Patient however is very tender in her right upper quadrant. Will start with labs including LFTs and lipase, urinalysis and right upper quadrant abdominal ultrasound. Will discuss with [facility] for records for pelvic exam and transvaginal ultrasound performed today.   ED PROGRESS:  10:32 AM  Spoke with Dr. Henderson Cloud, pt's OBGYN.  She reports patient's transvaginal ultrasound today was completely normal. She states that on pelvic exam, patient was tender diffusely with no localizing symptoms. She had no purulent drainage from her cervix. She thinks because of this, and the patient is afebrile, this is unlikely PID and she did not give empiric treatment. She did send a gonorrhea and chlamydia culture.  Labs here today are unremarkable. Urinalysis shows no sign of infection. Urine pregnancy test is negative. Right upper quadrant ultrasound is pending.  11:10 AM  Pt RUQ [place] negative.  Pt still with guarding and pain with palpation of RUQ and RLQ.  Although appendicitis unlikley, given her exam, will order CT AP to eval for intra-abdominal pathology. Her symptoms today are likely due to her chronic pelvic pain.  12:47 PM  Pt's CT scan is unremarkable. No intra-abdominal pathology to explain patient's pain. She's been able to tolerate by mouth in the emergency department and is comfortable. Her vital signs are within normal limits. We'll discharge her with close PCP and OB/GYN followup. Given the usual and  customary return precautions. Patient verbalizes understanding and is comfortable with this plan.  Stacy Maw Bertram Haddix, DO 04/08/13 1249

## 2013-04-08 NOTE — ED Notes (Signed)
US at bedside

## 2013-04-20 ENCOUNTER — Inpatient Hospital Stay (HOSPITAL_COMMUNITY)
Admission: AD | Admit: 2013-04-20 | Discharge: 2013-04-20 | Disposition: A | Discharge status: Home / Self Care | Payer: 59 | Source: Ambulatory Visit | Attending: Obstetrics & Gynecology | Admitting: Obstetrics & Gynecology

## 2013-04-20 ENCOUNTER — Inpatient Hospital Stay (HOSPITAL_COMMUNITY): Payer: 59

## 2013-04-20 ENCOUNTER — Encounter (HOSPITAL_COMMUNITY): Payer: Self-pay | Admitting: *Deleted

## 2013-04-20 DIAGNOSIS — R1032 Left lower quadrant pain: Secondary | ICD-10-CM | POA: Insufficient documentation

## 2013-04-20 DIAGNOSIS — K529 Noninfective gastroenteritis and colitis, unspecified: Secondary | ICD-10-CM

## 2013-04-20 DIAGNOSIS — R110 Nausea: Secondary | ICD-10-CM | POA: Insufficient documentation

## 2013-04-20 DIAGNOSIS — A088 Other specified intestinal infections: Secondary | ICD-10-CM | POA: Insufficient documentation

## 2013-04-20 LAB — CBC
HCT: 39 % (ref 36.0–46.0)
MCH: 27.9 pg (ref 26.0–34.0)
MCV: 82.5 fL (ref 78.0–100.0)
RBC: 4.73 MIL/uL (ref 3.87–5.11)
WBC: 8 10*3/uL (ref 4.0–10.5)

## 2013-04-20 LAB — URINALYSIS, ROUTINE W REFLEX MICROSCOPIC
Glucose, UA: NEGATIVE mg/dL
Hgb urine dipstick: NEGATIVE
Specific Gravity, Urine: >1.03 — ABNORMAL HIGH (ref 1.005–1.030)

## 2013-04-20 LAB — URINE MICROSCOPIC-ADD ON

## 2013-04-20 LAB — WET PREP, GENITAL: Clue Cells Wet Prep HPF POC: NONE SEEN

## 2013-04-20 MED ORDER — OXYCODONE-ACETAMINOPHEN 5-325 MG PO TABS
2.0000 | ORAL_TABLET | Freq: Once | ORAL | Status: AC
  Administered 2013-04-20: 2 via ORAL
  Filled 2013-04-20: qty 2

## 2013-04-20 MED ORDER — OXYCODONE-ACETAMINOPHEN 5-325 MG PO TABS: 1.0000 | ORAL_TABLET | ORAL | Status: DC | PRN

## 2013-04-20 MED ORDER — HYDROMORPHONE HCL PF 1 MG/ML IJ SOLN
1.0000 mg | Freq: Once | INTRAMUSCULAR | Status: AC
  Administered 2013-04-20: 1 mg via INTRAMUSCULAR
  Filled 2013-04-20: qty 1

## 2013-04-20 MED ORDER — KETOROLAC TROMETHAMINE 60 MG/2ML IM SOLN
60.0000 mg | Freq: Once | INTRAMUSCULAR | Status: AC
  Administered 2013-04-20: 60 mg via INTRAMUSCULAR
  Filled 2013-04-20: qty 2

## 2013-04-20 NOTE — MAU Provider Note (Signed)
Chief Complaint: Abdominal Pain, Nausea and Rectal Pain   First Provider Initiated Contact with Patient 04/20/13 0721     SUBJECTIVE HPI: Stacy Deleon is a 32 y.o. G65P3003 female who presents to maternity admissions with sudden onset of severe left lower quadrant pain and nausea at 5:30 AM. Also reports pelvic and rectal pressure. Has had similar pain to this in the past and states she was diagnosed with possible ruptured ovarian cyst.  Seen at Dr. Kittie Plater office 04/08/2013 rule abdominal pain. Normal ultrasound per patient and per ultrasound worksheet scanned in media tab. States she was referred to ED for additional evaluation. Normal abdominal ultrasound and normal CT except for her right Filshie clip displaced to the posterior cul-de-sac. Patient states that pain resolved and that this is a new episode.  Patient had 2 large bowel movements soon after arrival to maternity admissions. The second one was loose. Has history of irregular cycles. States her next period is due on the 20th of this month.  Past Medical History  Diagnosis Date  . Bronchitis   . Polycystic ovary syndrome   . Anxiety   . Depression   . ADHD (attention deficit hyperactivity disorder)    OB History  Gravida Para Term Preterm AB SAB TAB Ectopic Multiple Living  3 3 3       3     # Outcome Date GA Lbr Len/2nd Weight Sex Delivery Anes PTL Lv  3 TRM           2 TRM           1 TRM              Past Surgical History  Procedure Laterality Date  . Tubal ligation    . Hernia repair    . Plantar fascia surgery     History   Social History  . Marital Status: Single    Spouse Name: N/A    Number of Children: N/A  . Years of Education: N/A   Occupational History  . Not on file.   Social History Main Topics  . Smoking status: Former Smoker -- 2.00 packs/day for 3 years    Types: Cigarettes    Quit date: 07/06/2000  . Smokeless tobacco: Not on file  . Alcohol Use: No  . Drug Use: No  . Sexual  Activity: Yes    Birth Control/ Protection: None     Comment: last intercourse Oct 30th   Other Topics Concern  . Not on file   Social History Narrative  . No narrative on file   No current facility-administered medications on file prior to encounter.   Current Outpatient Prescriptions on File Prior to Encounter  Medication Sig Dispense Refill  . ARIPiprazole (ABILIFY) 5 MG tablet Take 5 mg by mouth every evening.      . docusate sodium (COLACE) 100 MG capsule Take 1 capsule (100 mg total) by mouth every 12 (twelve) hours.  60 capsule  0  . ibuprofen (ADVIL,MOTRIN) 200 MG tablet Take 600 mg by mouth every 6 (six) hours as needed for pain.      Marland Kitchen lisdexamfetamine (VYVANSE) 50 MG capsule Take 50 mg by mouth every morning.      . metFORMIN (GLUCOPHAGE) 500 MG tablet Take 1,000 mg by mouth 2 (two) times daily with a meal.      . ondansetron (ZOFRAN) 4 MG tablet Take 1 tablet (4 mg total) by mouth every 6 (six) hours.  12 tablet  0  .  oxyCODONE-acetaminophen (PERCOCET/ROXICET) 5-325 MG per tablet Take 1 tablet by mouth every 6 (six) hours as needed for pain.  15 tablet  0  . [DISCONTINUED] sertraline (ZOLOFT) 100 MG tablet Take 100 mg by mouth daily.         Allergies  Allergen Reactions  . Hydrocodone Itching    Severe itching    ROS: Positive for abdominal pain, pelvic pressure, rectal pressure, nausea, vaginal spotting and new onset of loose stools since arrival to maternity admissions. Negative for fever, chills, vomiting, constipation, vaginal discharge, dyspareunia, urinary complaints.  OBJECTIVE Blood pressure 107/85, pulse 85, temperature 97 F (36.1 C), temperature source Oral, resp. rate 20, height 5\' 5"  (1.651 m), weight 95.255 kg (210 lb), last menstrual period 02/19/2013. GENERAL: Well-developed, well-nourished female in severe distress, doubled over in wheelchair.  HEENT: Normocephalic HEART: normal rate RESP: normal effort ABDOMEN: Soft, diffusely tender but greatest  in the left lower quadrant. Negative rebound, negative mass. Positive bowel sounds x4. Well-healed umbilical hernia repair scar. EXTREMITIES: Nontender, no edema NEURO: Alert and oriented SPECULUM EXAM: NEFG, physiologic discharge, scant blood noted, cervix clean BIMANUAL: cervix closed; uterus normal size, positive left adnexal tenderness. No mass. No right adnexal tenderness or mass. No cervical motion tenderness. Patient intolerant of exam, but states this exam felt like any other except for the left adnexal tenderness.  LAB RESULTS Results for orders placed during the hospital encounter of 04/20/13 (from the past 24 hour(s))  URINALYSIS, ROUTINE W REFLEX MICROSCOPIC     Status: Abnormal   Collection Time    04/20/13  6:54 AM      Result Value Range   Color, Urine YELLOW  YELLOW   APPearance CLOUDY (*) CLEAR   Specific Gravity, Urine >1.030 (*) 1.005 - 1.030   pH 5.5  5.0 - 8.0   Glucose, UA NEGATIVE  NEGATIVE mg/dL   Hgb urine dipstick NEGATIVE  NEGATIVE   Bilirubin Urine SMALL (*) NEGATIVE   Ketones, ur NEGATIVE  NEGATIVE mg/dL   Protein, ur NEGATIVE  NEGATIVE mg/dL   Urobilinogen, UA 1.0  0.0 - 1.0 mg/dL   Nitrite NEGATIVE  NEGATIVE   Leukocytes, UA SMALL (*) NEGATIVE  URINE MICROSCOPIC-ADD ON     Status: Abnormal   Collection Time    04/20/13  6:54 AM      Result Value Range   Squamous Epithelial / LPF MANY (*) RARE   WBC, UA 7-10  <3 WBC/hpf   Bacteria, UA FEW (*) RARE   Urine-Other MUCOUS PRESENT    POCT PREGNANCY, URINE     Status: None   Collection Time    04/20/13  6:59 AM      Result Value Range   Preg Test, Ur NEGATIVE  NEGATIVE  CBC     Status: None   Collection Time    04/20/13  7:00 AM      Result Value Range   WBC 8.0  4.0 - 10.5 K/uL   RBC 4.73  3.87 - 5.11 MIL/uL   Hemoglobin 13.2  12.0 - 15.0 g/dL   HCT 11.9  14.7 - 82.9 %   MCV 82.5  78.0 - 100.0 fL   MCH 27.9  26.0 - 34.0 pg   MCHC 33.8  30.0 - 36.0 g/dL   RDW 56.2  13.0 - 86.5 %   Platelets 337   150 - 400 K/uL  WET PREP, GENITAL     Status: Abnormal   Collection Time    04/20/13  7:32 AM  Result Value Range   Yeast Wet Prep HPF POC NONE SEEN  NONE SEEN   Trich, Wet Prep NONE SEEN  NONE SEEN   Clue Cells Wet Prep HPF POC NONE SEEN  NONE SEEN   WBC, Wet Prep HPF POC MANY (*) NONE SEEN    IMAGING US Abdomen Complete  04/08/2013   *RADIOLOGY REPORT*  Abdominal ultrasound  History:  Abdominal pain  Comparison:  June 22, 2011  Findings:  Gallbladder is visualized in multiple projections. There are no gallstones, gallbladder wall thickening, or pericholecystic fluid collection.  There is no intrahepatic, common hepatic, or common bile duct dilatation.  The pancreas appears normal.  No focal liver lesions are identified.  Spleen is normal in size and homogeneous in echotexture.  Kidneys bilaterally appear normal.  There is no ascites.  Aorta is nonaneurysmal.  Inferior vena cava appears normal.  Conclusion:  Study within normal limits.   Original Report Authenticated By: Bretta Bang, M.D.   Ct Abdomen Pelvis W Contrast  04/08/2013   *RADIOLOGY REPORT*  Clinical Data: Right-sided abdominal pain. Prior umbilical  hernia repair and tubal ligation.  CT ABDOMEN AND PELVIS WITH CONTRAST  Technique:  Multidetector CT imaging of the abdomen and pelvis was performed following the standard protocol during bolus administration of intravenous contrast.  Contrast: 50mL OMNIPAQUE IOHEXOL 300 MG/ML  SOLN, OMNIPAQUE IOHEXOL 300 MG/ML  SOLN  Comparison: 04/08/2013 ultrasound.  10/25/2007 and 10/20/2007 CT abdomen and pelvis.  Findings: Lung bases clear.  Prior tubal ligation.  The right clip appears displaced into the cul-de-sac.  No extraluminal bowel inflammatory process, free fluid or free air.  Mild fullness of the right renal collecting system.  No obstructing stone is identified. Appearance is similar to 10/20/2007 exam and may be normal for this patient.  No worrisome hepatic, splenic,  pancreatic, adrenal or renal lesion. No calcified gallstone.  No abdominal aortic aneurysm.  No adenopathy.  Noncontrast filled views of the urinary bladder unremarkable.  No bony destructive lesion.  Mesh in place for prior supraumbilical hernia repair.  IMPRESSION: No extraluminal bowel inflammatory process, free fluid or free air.  Mild fullness of the right renal collecting system.  No obstructing stone is identified. Appearance is similar to 10/20/2007 exam and may be normal for this patient.  Prior tubal ligation.  The right clip appears displaced into the cul-de-sac.   Original Report Authenticated By: Lacy Duverney, M.D.   MAU COURSE Pelvic US ordered per consult w/ Dr. Tenny Craw to evaluate for ovarian cyst or ovarian torsion.   Care of pt turned over to Venia Carbon, NP at [facility] [place] 04/20/2013  8:00 AM    Assumed care of the patient at 0800; report received from [place] CNM  Awaiting [place] results.   Iona Hansen Aritha Huckeba, NP 04/20/2013 8:57 AM   US Transvaginal Non-ob  04/20/2013   EXAM: TRANSABDOMINAL AND TRANSVAGINAL ULTRASOUND OF PELVIS  DOPPLER ULTRASOUND OF OVARIES  TECHNIQUE: Both transabdominal and transvaginal ultrasound examinations of the pelvis were performed including evaluation of the uterus, ovaries, adnexal regions, and pelvic cul-de-sac. Color and duplex Doppler ultrasound was utilized to evaluate blood flow to the ovaries.  COMPARISON:  CT 04/08/2013  FINDINGS: Uterus  8.3 x 3.8 x 4.2 cm. Normal echotexture. No focal abnormality.  Endometrium  Normal appearance and thickness, 5 mm.  Right Ovary  1.9 x 3.2 x 1.3 cm. Normal size and echotexture. No adnexal masses. Normal arterial and venous blood flow.  Left Ovary  2.9 x 3.2 x 2.2 cm. Normal size and echotexture. No adnexal masses. Normal arterial and venous blood flow.  Other Findings  No free fluid.  IMPRESSION: Unremarkable study.   Electronically Signed   By: Charlett Nose   On: 04/20/2013 08:47   US  Pelvis Complete  04/20/2013   EXAM: TRANSABDOMINAL AND TRANSVAGINAL ULTRASOUND OF PELVIS  DOPPLER ULTRASOUND OF OVARIES  TECHNIQUE: Both transabdominal and transvaginal ultrasound examinations of the pelvis were performed including evaluation of the uterus, ovaries, adnexal regions, and pelvic cul-de-sac. Color and duplex Doppler ultrasound was utilized to evaluate blood flow to the ovaries.  COMPARISON:  CT 04/08/2013  FINDINGS: Uterus  8.3 x 3.8 x 4.2 cm. Normal echotexture. No focal abnormality.  Endometrium  Normal appearance and thickness, 5 mm.  Right Ovary  1.9 x 3.2 x 1.3 cm. Normal size and echotexture. No adnexal masses. Normal arterial and venous blood flow.  Left Ovary  2.9 x 3.2 x 2.2 cm. Normal size and echotexture. No adnexal masses. Normal arterial and venous blood flow.  Other Findings  No free fluid.  IMPRESSION: Unremarkable study.   Electronically Signed   By: Charlett Nose   On: 04/20/2013 08:47   [place] Art/ven Flow Abd Pelv Doppler  04/20/2013   EXAM: TRANSABDOMINAL AND TRANSVAGINAL ULTRASOUND OF PELVIS  DOPPLER ULTRASOUND OF OVARIES  TECHNIQUE: Both transabdominal and transvaginal ultrasound examinations of the pelvis were performed including evaluation of the uterus, ovaries, adnexal regions, and pelvic cul-de-sac. Color and duplex Doppler ultrasound was utilized to evaluate blood flow to the ovaries.  COMPARISON:  CT 04/08/2013  FINDINGS: Uterus  8.3 x 3.8 x 4.2 cm. Normal echotexture. No focal abnormality.  Endometrium  Normal appearance and thickness, 5 mm.  Right Ovary  1.9 x 3.2 x 1.3 cm. Normal size and echotexture. No adnexal masses. Normal arterial and venous blood flow.  Left Ovary  2.9 x 3.2 x 2.2 cm. Normal size and echotexture. No adnexal masses. Normal arterial and venous blood flow.  Other Findings  No free fluid.  IMPRESSION: Unremarkable study.   Electronically Signed   By: Charlett Nose   On: 04/20/2013 08:47   Toradol 60 mg IM given  Paged Dr. Tenny Craw @ [phone]; message  left Dr. Tenny Craw returned call at 0920; [place] report discussed/ plan of care discussed  1 mg Dilaudid IM once Pt has had 3 BM's while she has been in MAU; 2 loose/liquid  Ova and parasite/ Gram Stain ordered; pt unable to give sample in MAU    A: Abdominal pain  Bacterial Vs. Viral Gastroenteritis   P:  Discharge home  Follow up with Dr. Henderson Cloud and discuss GI referral  Increase your fluid intake to 8-10 glasses of water per day.  Dehydration risks discussed  Percocet 5-325; 1-2 tabs PO every 4-6 hours as needed for severe pain (#20) no RF  Follow up in MAU as needed   Debbrah Alar, NP 04/20/2013 10:10 AM

## 2013-04-20 NOTE — MAU Note (Signed)
Patient complains of vaginal, abdominal and rectal pain since 5:30am when she woke up with this severe pain. Hx of PCOS.

## 2013-04-21 LAB — URINE CULTURE: Colony Count: >=100000

## 2013-04-21 LAB — GC/CHLAMYDIA PROBE AMP
CT Probe RNA: NEGATIVE
GC Probe RNA: NEGATIVE

## 2013-05-16 ENCOUNTER — Emergency Department (HOSPITAL_COMMUNITY)
Admission: EM | Admit: 2013-05-16 | Discharge: 2013-05-16 | Disposition: A | Discharge status: Home / Self Care | Payer: Medicaid Other | Attending: Emergency Medicine | Admitting: Emergency Medicine

## 2013-05-16 ENCOUNTER — Encounter (HOSPITAL_COMMUNITY): Payer: Self-pay

## 2013-05-16 ENCOUNTER — Emergency Department (HOSPITAL_COMMUNITY): Payer: Medicaid Other

## 2013-05-16 DIAGNOSIS — M545 Low back pain: Secondary | ICD-10-CM

## 2013-05-16 DIAGNOSIS — S335XXA Sprain of ligaments of lumbar spine, initial encounter: Secondary | ICD-10-CM | POA: Insufficient documentation

## 2013-05-16 DIAGNOSIS — Z8639 Personal history of other endocrine, nutritional and metabolic disease: Secondary | ICD-10-CM | POA: Insufficient documentation

## 2013-05-16 DIAGNOSIS — Y929 Unspecified place or not applicable: Secondary | ICD-10-CM | POA: Insufficient documentation

## 2013-05-16 DIAGNOSIS — M62838 Other muscle spasm: Secondary | ICD-10-CM

## 2013-05-16 DIAGNOSIS — F3289 Other specified depressive episodes: Secondary | ICD-10-CM | POA: Insufficient documentation

## 2013-05-16 DIAGNOSIS — M62830 Muscle spasm of back: Secondary | ICD-10-CM

## 2013-05-16 DIAGNOSIS — Z79899 Other long term (current) drug therapy: Secondary | ICD-10-CM | POA: Insufficient documentation

## 2013-05-16 DIAGNOSIS — F909 Attention-deficit hyperactivity disorder, unspecified type: Secondary | ICD-10-CM | POA: Insufficient documentation

## 2013-05-16 DIAGNOSIS — S39012A Strain of muscle, fascia and tendon of lower back, initial encounter: Secondary | ICD-10-CM

## 2013-05-16 DIAGNOSIS — Z87891 Personal history of nicotine dependence: Secondary | ICD-10-CM | POA: Insufficient documentation

## 2013-05-16 DIAGNOSIS — X500XXA Overexertion from strenuous movement or load, initial encounter: Secondary | ICD-10-CM | POA: Insufficient documentation

## 2013-05-16 DIAGNOSIS — F329 Major depressive disorder, single episode, unspecified: Secondary | ICD-10-CM | POA: Insufficient documentation

## 2013-05-16 DIAGNOSIS — R269 Unspecified abnormalities of gait and mobility: Secondary | ICD-10-CM | POA: Insufficient documentation

## 2013-05-16 DIAGNOSIS — Y9389 Activity, other specified: Secondary | ICD-10-CM | POA: Insufficient documentation

## 2013-05-16 DIAGNOSIS — Z8709 Personal history of other diseases of the respiratory system: Secondary | ICD-10-CM | POA: Insufficient documentation

## 2013-05-16 DIAGNOSIS — IMO0002 Reserved for concepts with insufficient information to code with codable children: Secondary | ICD-10-CM | POA: Insufficient documentation

## 2013-05-16 DIAGNOSIS — Z862 Personal history of diseases of the blood and blood-forming organs and certain disorders involving the immune mechanism: Secondary | ICD-10-CM | POA: Insufficient documentation

## 2013-05-16 MED ORDER — DIAZEPAM 5 MG PO TABS
10.0000 mg | ORAL_TABLET | Freq: Once | ORAL | Status: AC
  Administered 2013-05-16: 10 mg via ORAL
  Filled 2013-05-16 (×2): qty 2

## 2013-05-16 MED ORDER — HYDROMORPHONE HCL PF 1 MG/ML IJ SOLN
1.0000 mg | Freq: Once | INTRAMUSCULAR | Status: AC
  Administered 2013-05-16: 1 mg via INTRAMUSCULAR
  Filled 2013-05-16: qty 1

## 2013-05-16 MED ORDER — OXYCODONE-ACETAMINOPHEN 5-325 MG PO TABS: 1.0000 | ORAL_TABLET | ORAL | Status: DC | PRN

## 2013-05-16 MED ORDER — IBUPROFEN 800 MG PO TABS: 800.0000 mg | ORAL_TABLET | Freq: Three times a day (TID) | ORAL | Status: DC | PRN

## 2013-05-16 MED ORDER — OXYCODONE-ACETAMINOPHEN 5-325 MG PO TABS
2.0000 | ORAL_TABLET | Freq: Once | ORAL | Status: AC
  Administered 2013-05-16: 2 via ORAL
  Filled 2013-05-16 (×2): qty 2

## 2013-05-16 MED ORDER — METHOCARBAMOL 750 MG PO TABS: 750.0000 mg | ORAL_TABLET | Freq: Four times a day (QID) | ORAL | Status: DC | PRN

## 2013-05-16 NOTE — ED Notes (Signed)
Patient reports that she was bending over putting a drawer in tall dresser. Patient states that she felt a popping and grinding in her left lower back. Patient states she was unable to stand and now is feeling a sharp pain down her right hip into her right inner thigh. Patient took 800 mg Ibuprofen at 1715, and showered with warm water and a heating pad with no relief.

## 2013-05-16 NOTE — ED Provider Notes (Signed)
CSN: [id number]     Arrival date & time 05/16/13  1753 History   First MD Initiated Contact with Patient 05/16/13 1758     Chief Complaint  Patient presents with  . Back Injury   (Consider location/radiation/quality/duration/timing/severity/associated sxs/prior Treatment) The history is provided by the patient and medical records. No language interpreter was used.    Stacy Deleon is a 32 y.o. female  with a hx of PCOS, anxiety, depression, migraine headaches, prolapsed bladder presents to the Emergency Department complaining of acute, persistent, low back pain beginning at 3:30pm.  Pt reports she was lifting a heavy drawer and when she went to bend over and felt a "pop and grind" in her back located on the Left side of her vertebrae.  Pt reports when she attempted to stand she felt a second, different "pop" in the middle of her back.  She reports that she was unable to stand for 6-7 minutes because of the pain. She took ibuprofen and a hot shower without relief.  Pt reports that now she hurts on the L side of her lower back, her right hip feels tight and the toes on both of her feet have paresthesias. Pt with degenerative disc disease in her back but has never had to be treated for the pain.  Pt was diagnosed with this after an MRI in March/April 2014.    Nothing makes it better and walking, moving and palaption makes it worse.  Pt denies fever, chills, headache, neck pain, chest pain shortness of breath, abdominal pain nausea, vomiting, diarrhea, syncope, dysuria, hematuria.     Past Medical History  Diagnosis Date  . Bronchitis   . Polycystic ovary syndrome   . Anxiety   . Depression   . ADHD (attention deficit hyperactivity disorder)    Past Surgical History  Procedure Laterality Date  . Tubal ligation    . Hernia repair    . Plantar fascia surgery     Family History  Problem Relation Age of Onset  . Hypertension Father   . Fibromyalgia Father   . Depression Father   . Cancer  Father   . Bipolar disorder Brother   . Fibromyalgia Brother    History  Substance Use Topics  . Smoking status: Former Smoker -- 2.00 packs/day for 3 years    Types: Cigarettes    Quit date: 07/06/2000  . Smokeless tobacco: Never Used  . Alcohol Use: No   OB History   Grav Para Term Preterm Abortions TAB SAB Ect Mult Living   3 3 3       3      Review of Systems  Constitutional: Negative for fever and fatigue.  HENT: Negative for neck pain and neck stiffness.   Respiratory: Negative for chest tightness and shortness of breath.   Cardiovascular: Negative for chest pain.  Gastrointestinal: Negative for nausea, vomiting, abdominal pain and diarrhea.  Genitourinary: Negative for dysuria, urgency, frequency and hematuria.  Musculoskeletal: Positive for back pain and gait problem ( 2/2 pain). Negative for joint swelling.  Skin: Negative for rash.  Neurological: Positive for weakness and numbness. Negative for dizziness, light-headedness and headaches.  All other systems reviewed and are negative.    Allergies  Hydrocodone  Home Medications   Current Outpatient Rx  Name  Route  Sig  Dispense  Refill  . ARIPiprazole (ABILIFY) 5 MG tablet   Oral   Take 5 mg by mouth every evening.         Marland Kitchen  ibuprofen (ADVIL,MOTRIN) 200 MG tablet   Oral   Take 800 mg by mouth every 6 (six) hours as needed for pain.          Marland Kitchen lisdexamfetamine (VYVANSE) 50 MG capsule   Oral   Take 50 mg by mouth every morning.         . metFORMIN (GLUCOPHAGE) 1000 MG tablet   Oral   Take 1,000 mg by mouth 2 (two) times daily with a meal.         . ibuprofen (ADVIL,MOTRIN) 800 MG tablet   Oral   Take 1 tablet (800 mg total) by mouth every 8 (eight) hours as needed for pain.   30 tablet   0   . methocarbamol (ROBAXIN) 750 MG tablet   Oral   Take 1 tablet (750 mg total) by mouth 4 (four) times daily as needed (Take 1 tablet every 6 hours as needed for muscle spasms.).   20 tablet   0   .  oxyCODONE-acetaminophen (PERCOCET) 5-325 MG per tablet   Oral   Take 1 tablet by mouth every 4 (four) hours as needed for pain (Take 1- 2 tablets every 4 - 6 hours as needed for pain.).   20 tablet   0   . oxyCODONE-acetaminophen (PERCOCET/ROXICET) 5-325 MG per tablet   Oral   Take 1-2 tablets by mouth every 4 (four) hours as needed for pain (Every 4-6 hours PRN).   20 tablet   0    BP 116/80  Pulse 88  Temp(Src) 98.6 F (37 C) (Oral)  Resp 16  Ht 5\' 5"  (1.651 m)  Wt 209 lb (94.802 kg)  BMI 34.78 kg/m2  SpO2 100%  LMP 03/21/2013 Physical Exam  Nursing note and vitals reviewed. Constitutional: She is oriented to person, place, and time. She appears well-developed and well-nourished. No distress.  Pt tearful  HENT:  Head: Normocephalic and atraumatic.  Mouth/Throat: Oropharynx is clear and moist. No oropharyngeal exudate.  Eyes: Conjunctivae are normal. Pupils are equal, round, and reactive to light.  Neck: Normal range of motion. Neck supple.  Full ROM without pain  Cardiovascular: Regular rhythm, normal heart sounds and intact distal pulses.   Tachycardia Capillary refill < 3 sec  Pulmonary/Chest: Effort normal and breath sounds normal. No respiratory distress. She has no wheezes.  Abdominal: Soft. Bowel sounds are normal. She exhibits no distension. There is no tenderness.  Musculoskeletal: She exhibits tenderness. She exhibits no edema.       Thoracic back: She exhibits decreased range of motion, tenderness and pain. She exhibits no bony tenderness, no swelling, no edema, no deformity, no laceration, no spasm and normal pulse.       Lumbar back: She exhibits decreased range of motion, tenderness, pain and spasm. She exhibits no bony tenderness, no swelling, no edema, no deformity, no laceration and normal pulse.  Decreased range of motion of the T-spine and L-spine No midline tenderness to the T-spine or L-spine; no palpable deformity Tenderness to palpation of the  paraspinous muscles of the L-spine  Lymphadenopathy:    She has no cervical adenopathy.  Neurological: She is alert and oriented to person, place, and time. She exhibits normal muscle tone. Coordination normal. GCS eye subscore is 4. GCS verbal subscore is 5. GCS motor subscore is 6.  Reflex Scores:      Patellar reflexes are 2+ on the right side and 2+ on the left side.      Achilles reflexes are 2+ on the  right side and 2+ on the left side. Speech is clear and goal oriented, follows commands  Decreased strength in lower extremities bilaterally including dorsiflexion and plantar flexion, exam limited 2/2 to severe pain  Sensation normal to light and sharp touch of the right lower extremity; decreased sensation/numbness in the L5/S1 distribution of the left lower extremity - confirmed with needle  Patient with altered gait, dragging left leg; no ataxia  Skin: Skin is warm and dry. No rash noted. She is not diaphoretic. No erythema.  Psychiatric: She has a normal mood and affect.    ED Course  Procedures (including critical care time) Labs Review Labs Reviewed - No data to display Imaging Review Dg Lumbar Spine Complete  05/16/2013   CLINICAL DATA:  Low back pain.  EXAM: LUMBAR SPINE - COMPLETE 4+ VIEW  COMPARISON:  No priors.  FINDINGS: Alignment is anatomic. No acute displaced fractures. No definite defects of the pars interarticularis are noted. No significant degenerative changes.  IMPRESSION: No acute radiographic abnormality of the lumbar spine to account for the patient's symptoms.   Electronically Signed   By: Trudie Reed M.D.   On: 05/16/2013 18:47   Mr Lumbar Spine Wo Contrast  05/16/2013   CLINICAL DATA:  Leg weakness and numbness. Back pain.  EXAM: MRI LUMBAR SPINE WITHOUT CONTRAST  TECHNIQUE: Multiplanar, multisequence MR imaging was performed. No intravenous contrast was administered.  COMPARISON:  12/22/2012.  FINDINGS: Diffusely low signal marrow, both on T1 and T2  weighted imaging. This is unchanged from prior. No focal masslike marrow abnormality.  Marrow edema within the in right lower L4 vertebral body is most likely discogenic, unchanged from prior. No evidence of spinal infection or fracture.  Normal conus signal and morphology.  L3-4: Degenerative disc disease with central and left paracentral herniation, not resulting in nerve root impingement.  L4-5: Given differences in acquisition, similar sized central herniation with slight inferior migration. There is mild effacement of the lateral recesses, without nerve root compression.  L5-S1:  Central disc herniation without nerve root impingement.  IMPRESSION: 1. No acute findings or change from 12/22/2012. 2. Degenerative disc disease at L3-4, L4-5, and L5-S1, with small disk herniations. No nerve root impingement.   Electronically Signed   By: Tiburcio Pea   On: 05/16/2013 21:17    MDM   1. Pain, low back   2. Muscle spasm   3. Back muscle spasm   4. Low back pain   5. Strain of lumbar region, initial encounter [847.2]      Stacy Deleon presents with back pain and concerning neurological exam.  L-spine x-rays unremarkable.  Patient has been seen here several times in last year but is not present here complaining of back pain a regular basis.  Will order MRI.    MRI with No acute findings or change from 12/22/2012; Degenerative disc disease at L3-4, L4-5, and L5-S1, with small disk herniations. No nerve root impingement.   I personally reviewed the imaging tests through PACS system.  I reviewed available ER/hospitalization records through the EMR.  Patient pain control here in the department.  Patient with back pain.  No loss of bowel or bladder control; saddle anesthesia; no clinical concern for cauda equina.  No fever, night sweats, weight loss, h/o cancer, IVDU.  MRI negative. RICE protocol and pain medicine indicated and discussed with patient. Patient to be referred to her primary care  physician and neurosurgery as needed.  It has been determined that no acute  conditions requiring further emergency intervention are present at this time. The patient/guardian have been advised of the diagnosis and plan. We have discussed signs and symptoms that warrant return to the ED, such as changes or worsening in symptoms.   Vital signs are stable at discharge.   BP 116/80  Pulse 88  Temp(Src) 98.6 F (37 C) (Oral)  Resp 16  Ht 5\' 5"  (1.651 m)  Wt 209 lb (94.802 kg)  BMI 34.78 kg/m2  SpO2 100%  LMP 03/21/2013  Patient/guardian has voiced understanding and agreed to follow-up with the PCP or specialist.          Dierdre Forth, PA-C 05/17/13 [phone]

## 2013-05-16 NOTE — ED Notes (Signed)
Pt c/o pain in back and decrease movement in BLE. LLE worse than RLE. Toes are cold to touch. Follows commands, able to move toes.

## 2013-05-17 NOTE — ED Provider Notes (Signed)
Medical screening examination/treatment/procedure(s) were performed by non-physician practitioner and as supervising physician I was immediately available for consultation/collaboration.   Gwyneth Sprout, MD 05/17/13 1620

## 2013-07-26 ENCOUNTER — Emergency Department (HOSPITAL_COMMUNITY): Payer: No Typology Code available for payment source

## 2013-07-26 ENCOUNTER — Encounter (HOSPITAL_COMMUNITY): Payer: Self-pay | Admitting: Emergency Medicine

## 2013-07-26 ENCOUNTER — Emergency Department (HOSPITAL_COMMUNITY)
Admission: EM | Admit: 2013-07-26 | Discharge: 2013-07-26 | Disposition: A | Discharge status: Home / Self Care | Payer: No Typology Code available for payment source | Attending: Emergency Medicine | Admitting: Emergency Medicine

## 2013-07-26 DIAGNOSIS — Z87891 Personal history of nicotine dependence: Secondary | ICD-10-CM | POA: Insufficient documentation

## 2013-07-26 DIAGNOSIS — S139XXA Sprain of joints and ligaments of unspecified parts of neck, initial encounter: Secondary | ICD-10-CM | POA: Insufficient documentation

## 2013-07-26 DIAGNOSIS — F329 Major depressive disorder, single episode, unspecified: Secondary | ICD-10-CM | POA: Insufficient documentation

## 2013-07-26 DIAGNOSIS — Z8709 Personal history of other diseases of the respiratory system: Secondary | ICD-10-CM | POA: Insufficient documentation

## 2013-07-26 DIAGNOSIS — S161XXA Strain of muscle, fascia and tendon at neck level, initial encounter: Secondary | ICD-10-CM

## 2013-07-26 DIAGNOSIS — Z8639 Personal history of other endocrine, nutritional and metabolic disease: Secondary | ICD-10-CM | POA: Insufficient documentation

## 2013-07-26 DIAGNOSIS — Y9389 Activity, other specified: Secondary | ICD-10-CM | POA: Insufficient documentation

## 2013-07-26 DIAGNOSIS — F411 Generalized anxiety disorder: Secondary | ICD-10-CM | POA: Insufficient documentation

## 2013-07-26 DIAGNOSIS — Z862 Personal history of diseases of the blood and blood-forming organs and certain disorders involving the immune mechanism: Secondary | ICD-10-CM | POA: Insufficient documentation

## 2013-07-26 DIAGNOSIS — M549 Dorsalgia, unspecified: Secondary | ICD-10-CM

## 2013-07-26 DIAGNOSIS — F909 Attention-deficit hyperactivity disorder, unspecified type: Secondary | ICD-10-CM | POA: Insufficient documentation

## 2013-07-26 DIAGNOSIS — F3289 Other specified depressive episodes: Secondary | ICD-10-CM | POA: Insufficient documentation

## 2013-07-26 DIAGNOSIS — IMO0002 Reserved for concepts with insufficient information to code with codable children: Secondary | ICD-10-CM | POA: Insufficient documentation

## 2013-07-26 DIAGNOSIS — Z79899 Other long term (current) drug therapy: Secondary | ICD-10-CM | POA: Insufficient documentation

## 2013-07-26 DIAGNOSIS — Y92410 Unspecified street and highway as the place of occurrence of the external cause: Secondary | ICD-10-CM | POA: Insufficient documentation

## 2013-07-26 MED ORDER — METHOCARBAMOL 500 MG PO TABS: 500.0000 mg | ORAL_TABLET | Freq: Two times a day (BID) | ORAL | Status: DC | PRN

## 2013-07-26 MED ORDER — OXYCODONE-ACETAMINOPHEN 5-325 MG PO TABS
1.0000 | ORAL_TABLET | Freq: Once | ORAL | Status: AC
  Administered 2013-07-26: 1 via ORAL
  Filled 2013-07-26: qty 1

## 2013-07-26 MED ORDER — OXYCODONE-ACETAMINOPHEN 5-325 MG PO TABS: 1.0000 | ORAL_TABLET | ORAL | Status: DC | PRN

## 2013-07-26 MED ORDER — METHOCARBAMOL 500 MG PO TABS
500.0000 mg | ORAL_TABLET | Freq: Once | ORAL | Status: AC
  Administered 2013-07-26: 500 mg via ORAL
  Filled 2013-07-26: qty 1

## 2013-07-26 NOTE — ED Notes (Signed)
Patient transported to CT/Xray. 
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
## 2013-07-28 NOTE — ED Provider Notes (Signed)
Medical screening examination/treatment/procedure(s) were performed by non-physician practitioner and as supervising physician I was immediately available for consultation/collaboration.  EKG Interpretation   None         Ambreen Tufte M Omarie Parcell, DO 07/28/13 2152 

## 2013-08-31 ENCOUNTER — Encounter (HOSPITAL_BASED_OUTPATIENT_CLINIC_OR_DEPARTMENT_OTHER): Payer: Self-pay | Admitting: Emergency Medicine

## 2013-08-31 ENCOUNTER — Emergency Department (HOSPITAL_BASED_OUTPATIENT_CLINIC_OR_DEPARTMENT_OTHER)
Admission: EM | Admit: 2013-08-31 | Discharge: 2013-08-31 | Disposition: A | Discharge status: Home / Self Care | Payer: Medicaid Other | Attending: Emergency Medicine | Admitting: Emergency Medicine

## 2013-08-31 DIAGNOSIS — Z79899 Other long term (current) drug therapy: Secondary | ICD-10-CM | POA: Insufficient documentation

## 2013-08-31 DIAGNOSIS — Z8742 Personal history of other diseases of the female genital tract: Secondary | ICD-10-CM | POA: Insufficient documentation

## 2013-08-31 DIAGNOSIS — F909 Attention-deficit hyperactivity disorder, unspecified type: Secondary | ICD-10-CM | POA: Insufficient documentation

## 2013-08-31 DIAGNOSIS — J069 Acute upper respiratory infection, unspecified: Secondary | ICD-10-CM

## 2013-08-31 DIAGNOSIS — Z87891 Personal history of nicotine dependence: Secondary | ICD-10-CM | POA: Insufficient documentation

## 2013-08-31 DIAGNOSIS — F3289 Other specified depressive episodes: Secondary | ICD-10-CM | POA: Insufficient documentation

## 2013-08-31 DIAGNOSIS — F329 Major depressive disorder, single episode, unspecified: Secondary | ICD-10-CM | POA: Insufficient documentation

## 2013-08-31 DIAGNOSIS — R110 Nausea: Secondary | ICD-10-CM | POA: Insufficient documentation

## 2013-08-31 MED ORDER — ONDANSETRON HCL 8 MG PO TABS
4.0000 mg | ORAL_TABLET | Freq: Once | ORAL | Status: AC
  Administered 2013-08-31: 4 mg via ORAL
  Filled 2013-08-31: qty 1

## 2013-08-31 MED ORDER — ONDANSETRON HCL 4 MG PO TABS: 4.0000 mg | ORAL_TABLET | Freq: Four times a day (QID) | ORAL | Status: DC

## 2013-08-31 MED ORDER — IBUPROFEN 800 MG PO TABS: 800.0000 mg | ORAL_TABLET | Freq: Three times a day (TID) | ORAL | Status: DC

## 2013-08-31 MED ORDER — IBUPROFEN 800 MG PO TABS
800.0000 mg | ORAL_TABLET | Freq: Once | ORAL | Status: AC
  Administered 2013-08-31: 800 mg via ORAL
  Filled 2013-08-31: qty 1

## 2013-08-31 NOTE — ED Provider Notes (Signed)
CSN: [id number]     Arrival date & time 08/31/13  1352 History   First MD Initiated Contact with Patient 08/31/13 1418     Chief Complaint  Patient presents with  . Influenza   (Consider location/radiation/quality/duration/timing/severity/associated sxs/prior Treatment) Patient is a 32 y.o. female presenting with flu symptoms. The history is provided by the patient. No language interpreter was used.  Influenza Presenting symptoms: fatigue, myalgias, nausea, rhinorrhea and sore throat   Presenting symptoms: no cough and no vomiting   Severity:  Moderate Associated symptoms: nasal congestion   Associated symptoms comment:  Nasal congestion and sinus pressure with sore throat and nausea (no vomiting) for the past 2 days. No one else at home sick.    Past Medical History  Diagnosis Date  . Bronchitis   . Polycystic ovary syndrome   . Anxiety   . Depression   . ADHD (attention deficit hyperactivity disorder)    Past Surgical History  Procedure Laterality Date  . Tubal ligation    . Hernia repair    . Plantar fascia surgery     Family History  Problem Relation Age of Onset  . Hypertension Father   . Fibromyalgia Father   . Depression Father   . Cancer Father   . Bipolar disorder Brother   . Fibromyalgia Brother    History  Substance Use Topics  . Smoking status: Former Smoker -- 2.00 packs/day for 3 years    Types: Cigarettes    Quit date: 07/06/2000  . Smokeless tobacco: Never Used  . Alcohol Use: No   OB History   Grav Para Term Preterm Abortions TAB SAB Ect Mult Living   3 3 3       3      Review of Systems  Constitutional: Positive for fatigue.  HENT: Positive for congestion, rhinorrhea and sore throat. Negative for trouble swallowing.   Respiratory: Negative for cough.   Gastrointestinal: Positive for nausea. Negative for vomiting and abdominal pain.  Musculoskeletal: Positive for myalgias.  Skin: Negative for rash.    Allergies  Hydrocodone  Home  Medications   Current Outpatient Rx  Name  Route  Sig  Dispense  Refill  . ARIPiprazole (ABILIFY) 5 MG tablet   Oral   Take 5 mg by mouth every morning.          Marland Kitchen ibuprofen (ADVIL,MOTRIN) 200 MG tablet   Oral   Take 400 mg by mouth every 6 (six) hours as needed for pain.          Marland Kitchen lisdexamfetamine (VYVANSE) 50 MG capsule   Oral   Take 50 mg by mouth every morning.         . metFORMIN (GLUCOPHAGE) 1000 MG tablet   Oral   Take 2,000 mg by mouth every evening.          . methocarbamol (ROBAXIN) 500 MG tablet   Oral   Take 1 tablet (500 mg total) by mouth 2 (two) times daily as needed.   14 tablet   0   . methocarbamol (ROBAXIN) 750 MG tablet   Oral   Take 750 mg by mouth every 6 (six) hours as needed for muscle spasms.         Marland Kitchen oxyCODONE-acetaminophen (PERCOCET/ROXICET) 5-325 MG per tablet   Oral   Take 1-2 tablets by mouth every 4 (four) hours as needed for severe pain.         Marland Kitchen oxyCODONE-acetaminophen (PERCOCET/ROXICET) 5-325 MG per tablet   Oral  Take 1 tablet by mouth every 4 (four) hours as needed.   15 tablet   0    BP 121/68  Pulse 104  Temp(Src) 98 F (36.7 C) (Oral)  Resp 20  Ht 5\' 6"  (1.676 m)  Wt 210 lb (95.255 kg)  BMI 33.91 kg/m2  SpO2 100% Physical Exam  Constitutional: She is oriented to person, place, and time. She appears well-developed and well-nourished.  HENT:  Head: Normocephalic.  Right Ear: External ear normal.  Left Ear: External ear normal.  Nose: Mucosal edema present.  Mouth/Throat: Mucous membranes are normal. Posterior oropharyngeal erythema present. No oropharyngeal exudate or posterior oropharyngeal edema.  Eyes: Conjunctivae are normal.  Neck: Normal range of motion. Neck supple.  Cardiovascular: Normal rate and regular rhythm.   No murmur heard. Pulmonary/Chest: Effort normal and breath sounds normal. She has no wheezes. She has no rales.  Abdominal: Soft. Bowel sounds are normal. There is no tenderness.  There is no rebound and no guarding.  Musculoskeletal: Normal range of motion.  Neurological: She is alert and oriented to person, place, and time.  Skin: Skin is warm and dry. No rash noted.  Psychiatric: She has a normal mood and affect.    ED Course  Procedures (including critical care time) Labs Review Labs Reviewed - No data to display Imaging Review No results found.  EKG Interpretation   None       MDM  No diagnosis found. 1. URI 2. Pharyngitis  Patient well appearing, symptomatic with viral symptoms. Lungs clear, afebrile. Suspect viral etiology. DDx: influenza vs URI    Arnoldo Hooker, PA-C 08/31/13 1517

## 2013-08-31 NOTE — ED Notes (Signed)
Cough, congestion, left ear pain, chills, N/V

## 2013-09-01 NOTE — ED Provider Notes (Signed)
Medical screening examination/treatment/procedure(s) were performed by non-physician practitioner and as supervising physician I was immediately available for consultation/collaboration.  EKG Interpretation   None         Charles B. Sheldon, MD 09/01/13 0704 

## 2013-09-02 ENCOUNTER — Encounter (HOSPITAL_BASED_OUTPATIENT_CLINIC_OR_DEPARTMENT_OTHER): Payer: Self-pay | Admitting: Emergency Medicine

## 2013-09-02 ENCOUNTER — Emergency Department (HOSPITAL_BASED_OUTPATIENT_CLINIC_OR_DEPARTMENT_OTHER)
Admission: EM | Admit: 2013-09-02 | Discharge: 2013-09-02 | Discharge status: Against Medical Advice | Payer: Medicaid Other | Attending: Emergency Medicine | Admitting: Emergency Medicine

## 2013-09-02 DIAGNOSIS — R112 Nausea with vomiting, unspecified: Secondary | ICD-10-CM | POA: Insufficient documentation

## 2013-09-02 DIAGNOSIS — R5383 Other fatigue: Principal | ICD-10-CM

## 2013-09-02 DIAGNOSIS — R5381 Other malaise: Secondary | ICD-10-CM | POA: Insufficient documentation

## 2013-09-02 NOTE — ED Notes (Signed)
Pt called stating prescriptions were not called in and she was not given written prescriptions. Dr Stark Jock approved for this nurse to call in prescriptions for ibuprofen and ondansetron. meds called into [facility].

## 2013-09-02 NOTE — ED Notes (Signed)
Nausea, vomiting, fever, weakness and aching all over.

## 2013-12-16 ENCOUNTER — Other Ambulatory Visit: Payer: Self-pay | Admitting: Anesthesiology

## 2013-12-16 DIAGNOSIS — M545 Low back pain, unspecified: Secondary | ICD-10-CM

## 2013-12-27 ENCOUNTER — Ambulatory Visit
Admission: RE | Admit: 2013-12-27 | Discharge: 2013-12-27 | Disposition: A | Discharge status: Home / Self Care | Payer: Medicaid Other | Source: Ambulatory Visit | Attending: Anesthesiology | Admitting: Anesthesiology

## 2013-12-27 DIAGNOSIS — M545 Low back pain, unspecified: Secondary | ICD-10-CM

## 2014-01-05 ENCOUNTER — Emergency Department (HOSPITAL_COMMUNITY)
Admission: EM | Admit: 2014-01-05 | Discharge: 2014-01-05 | Disposition: A | Discharge status: Home / Self Care | Payer: Medicaid Other | Attending: Emergency Medicine | Admitting: Emergency Medicine

## 2014-01-05 ENCOUNTER — Encounter (HOSPITAL_COMMUNITY): Payer: Self-pay | Admitting: Emergency Medicine

## 2014-01-05 DIAGNOSIS — Z87891 Personal history of nicotine dependence: Secondary | ICD-10-CM | POA: Insufficient documentation

## 2014-01-05 DIAGNOSIS — Z862 Personal history of diseases of the blood and blood-forming organs and certain disorders involving the immune mechanism: Secondary | ICD-10-CM | POA: Insufficient documentation

## 2014-01-05 DIAGNOSIS — R52 Pain, unspecified: Secondary | ICD-10-CM | POA: Insufficient documentation

## 2014-01-05 DIAGNOSIS — F909 Attention-deficit hyperactivity disorder, unspecified type: Secondary | ICD-10-CM | POA: Insufficient documentation

## 2014-01-05 DIAGNOSIS — F3289 Other specified depressive episodes: Secondary | ICD-10-CM | POA: Insufficient documentation

## 2014-01-05 DIAGNOSIS — Z79899 Other long term (current) drug therapy: Secondary | ICD-10-CM | POA: Insufficient documentation

## 2014-01-05 DIAGNOSIS — G8929 Other chronic pain: Secondary | ICD-10-CM | POA: Insufficient documentation

## 2014-01-05 DIAGNOSIS — Z8639 Personal history of other endocrine, nutritional and metabolic disease: Secondary | ICD-10-CM | POA: Insufficient documentation

## 2014-01-05 DIAGNOSIS — M545 Low back pain, unspecified: Secondary | ICD-10-CM

## 2014-01-05 DIAGNOSIS — F329 Major depressive disorder, single episode, unspecified: Secondary | ICD-10-CM | POA: Insufficient documentation

## 2014-01-05 DIAGNOSIS — Z8709 Personal history of other diseases of the respiratory system: Secondary | ICD-10-CM | POA: Insufficient documentation

## 2014-01-05 MED ORDER — HYDROMORPHONE HCL PF 2 MG/ML IJ SOLN
2.0000 mg | Freq: Once | INTRAMUSCULAR | Status: AC
  Administered 2014-01-05: 2 mg via INTRAMUSCULAR
  Filled 2014-01-05: qty 1

## 2014-01-05 MED ORDER — ONDANSETRON 8 MG PO TBDP
8.0000 mg | ORAL_TABLET | Freq: Once | ORAL | Status: AC
  Administered 2014-01-05: 8 mg via ORAL
  Filled 2014-01-05: qty 1

## 2014-01-05 NOTE — ED Provider Notes (Signed)
CSN: [id number]     Arrival date & time 01/05/14  0036 History   First MD Initiated Contact with Patient 01/05/14 [phone]     Chief Complaint  Patient presents with  . 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  She reports she feels like she overdid it cleaning house today, had worsening of her low back pain.  She has taken her percocet and flexeril without improvement.  Pt is followed by orthopedics, pain clinic, and neurosurgery.  She saw her neurosurgeon yesterday, Dr Ronnald Ramp, who recommended she be seen at a tertiary care center by nsgy.  No weakness, numbness, no bowel or bladder dysfunction.  Pain is in same location, on either side of her lower spine.   Past Medical History  Diagnosis Date  . Bronchitis   . Polycystic ovary syndrome   . Anxiety   . Depression   . ADHD (attention deficit hyperactivity disorder)    Past Surgical History  Procedure Laterality Date  . Tubal ligation    . Hernia repair    . Plantar fascia surgery     Family History  Problem Relation Age of Onset  . Hypertension Father   . Fibromyalgia Father   . Depression Father   . Cancer Father   . Bipolar disorder Brother   . Fibromyalgia Brother    History  Substance Use Topics  . Smoking status: Former Smoker -- 2.00 packs/day for 3 years    Types: Cigarettes  . Smokeless tobacco: Never Used  . Alcohol Use: No   OB History   Grav Para Term Preterm Abortions TAB SAB Ect Mult Living   3 3 3       3      Review of Systems  All other systems reviewed and are negative.     Allergies  Hydrocodone  Home Medications   Prior to Admission medications   Medication Sig Start Date End Date Taking? Authorizing Provider  ARIPiprazole (ABILIFY) 5 MG tablet Take 5 mg by mouth every morning.      Historical Provider, MD  ibuprofen (ADVIL,MOTRIN) 200 MG tablet Take 800 mg by mouth every 8 (eight) hours as needed for moderate pain.     Historical Provider, MD  lisdexamfetamine (VYVANSE) 50 MG capsule Take 50 mg by mouth every morning.    Historical Provider, MD  metFORMIN (GLUCOPHAGE) 1000 MG tablet Take 2,000 mg by mouth every evening.     Historical Provider, MD  methocarbamol (ROBAXIN) 500 MG tablet Take 1 tablet (500 mg total) by mouth 2 (two) times daily as needed. 07/26/13   Larene Pickett, PA-C  oxyCODONE-acetaminophen (PERCOCET/ROXICET) 5-325 MG per tablet Take 1-2 tablets by mouth every 4 (four) hours as needed for severe pain.    Historical Provider, MD   BP 117/76  Pulse 61  Temp(Src) 98 F (36.7 C) (Oral)  Resp 17  Ht 5\' 6"  (1.676 m)  Wt 212 lb (96.163 kg)  BMI 34.23 kg/m2  SpO2 99%  LMP 12/27/2013 Physical Exam  Nursing note and vitals reviewed. Constitutional: She is oriented to person, place, and time. She appears well-developed and well-nourished. No distress.  HENT:  Head: Normocephalic and atraumatic.  Nose: Nose normal.  Mouth/Throat: Oropharynx is clear and moist.  Eyes: Conjunctivae and EOM are normal. Pupils are equal, round, and reactive to light.  Neck: Normal range of motion. Neck supple. No JVD present. No tracheal deviation present. No thyromegaly present.  Cardiovascular: Normal rate, regular rhythm, normal heart sounds and intact distal pulses.  Exam reveals no gallop and no friction rub.   No murmur heard. Pulmonary/Chest: Effort normal and breath sounds normal. No stridor. No respiratory distress. She has no wheezes. She has no rales. She exhibits no tenderness.  Abdominal: Soft. Bowel sounds are normal. She exhibits no distension and no mass. There is no tenderness. There is no rebound and no guarding.  Musculoskeletal: Normal range of motion. She exhibits tenderness (ttp over paraspinal muscles in lumbar region). She exhibits no edema.   Lymphadenopathy:    She has no cervical adenopathy.  Neurological: She is alert and oriented to person, place, and time. She has normal reflexes. No cranial nerve deficit. She exhibits normal muscle tone. Coordination normal.  Skin: Skin is warm and dry. No rash noted. No erythema. No pallor.  Psychiatric: She has a normal mood and affect. Her behavior is normal. Judgment and thought content normal.    ED Course  Procedures (including critical care time) Labs Review Labs Reviewed - No data to display  Imaging Review No results found.   EKG Interpretation None      MDM   Final diagnoses:  Acute exacerbation of chronic low back pain    33 yo female with acute on chronic pain, no red flags on history or physical.  She has adequate follow up    Kalman Drape, MD 01/05/14 [phone]

## 2014-01-05 NOTE — ED Notes (Signed)
Pt states her L4-L5 is protruding into her therical sac and the disc has a tear in it  Pt is c/o pain from that  Pt states she has taken all of her normal meds without relief

## 2014-01-05 NOTE — Discharge Instructions (Signed)
Chronic Back Pain   When back pain lasts longer than 3 months, it is called chronic back pain.People with chronic back pain often go through certain periods that are more intense (flare-ups).   CAUSES  Chronic back pain can be caused by wear and tear (degeneration) on different structures in your back. These structures include:   The bones of your spine (vertebrae) and the joints surrounding your spinal cord and nerve roots (facets).   The strong, fibrous tissues that connect your vertebrae (ligaments).  Degeneration of these structures may result in pressure on your nerves. This can lead to constant pain.  HOME CARE INSTRUCTIONS   Avoid bending, heavy lifting, prolonged sitting, and activities which make the problem worse.   Take brief periods of rest throughout the day to reduce your pain. Lying down or standing usually is better than sitting while you are resting.   Take over-the-counter or prescription medicines only as directed by your caregiver.  SEEK IMMEDIATE MEDICAL CARE IF:    You have weakness or numbness in one of your legs or feet.   You have trouble controlling your bladder or bowels.   You have nausea, vomiting, abdominal pain, shortness of breath, or fainting.  Document Released: 09/26/2004 Document Revised: 11/11/2011 Document Reviewed: 08/03/2011  [employer] Patient Information 2014 [employer]

## 2014-05-08 ENCOUNTER — Encounter (HOSPITAL_COMMUNITY): Payer: Self-pay | Admitting: Emergency Medicine

## 2014-05-08 ENCOUNTER — Emergency Department (HOSPITAL_COMMUNITY)
Admission: EM | Admit: 2014-05-08 | Discharge: 2014-05-09 | Disposition: A | Discharge status: Home / Self Care | Payer: Medicaid Other | Attending: Emergency Medicine | Admitting: Emergency Medicine

## 2014-05-08 DIAGNOSIS — Y9389 Activity, other specified: Secondary | ICD-10-CM | POA: Diagnosis not present

## 2014-05-08 DIAGNOSIS — F329 Major depressive disorder, single episode, unspecified: Secondary | ICD-10-CM | POA: Diagnosis not present

## 2014-05-08 DIAGNOSIS — Z8742 Personal history of other diseases of the female genital tract: Secondary | ICD-10-CM | POA: Insufficient documentation

## 2014-05-08 DIAGNOSIS — X500XXA Overexertion from strenuous movement or load, initial encounter: Secondary | ICD-10-CM | POA: Diagnosis not present

## 2014-05-08 DIAGNOSIS — Y929 Unspecified place or not applicable: Secondary | ICD-10-CM | POA: Diagnosis not present

## 2014-05-08 DIAGNOSIS — Z8709 Personal history of other diseases of the respiratory system: Secondary | ICD-10-CM | POA: Insufficient documentation

## 2014-05-08 DIAGNOSIS — M545 Low back pain, unspecified: Secondary | ICD-10-CM

## 2014-05-08 DIAGNOSIS — IMO0002 Reserved for concepts with insufficient information to code with codable children: Secondary | ICD-10-CM | POA: Insufficient documentation

## 2014-05-08 DIAGNOSIS — Z79899 Other long term (current) drug therapy: Secondary | ICD-10-CM | POA: Diagnosis not present

## 2014-05-08 DIAGNOSIS — F909 Attention-deficit hyperactivity disorder, unspecified type: Secondary | ICD-10-CM | POA: Diagnosis not present

## 2014-05-08 DIAGNOSIS — Z87891 Personal history of nicotine dependence: Secondary | ICD-10-CM | POA: Diagnosis not present

## 2014-05-08 DIAGNOSIS — Z791 Long term (current) use of non-steroidal anti-inflammatories (NSAID): Secondary | ICD-10-CM | POA: Diagnosis not present

## 2014-05-08 DIAGNOSIS — F3289 Other specified depressive episodes: Secondary | ICD-10-CM | POA: Insufficient documentation

## 2014-05-08 DIAGNOSIS — F411 Generalized anxiety disorder: Secondary | ICD-10-CM | POA: Insufficient documentation

## 2014-05-08 MED ORDER — DIAZEPAM 5 MG PO TABS
5.0000 mg | ORAL_TABLET | Freq: Once | ORAL | Status: AC
  Administered 2014-05-09: 5 mg via ORAL
  Filled 2014-05-08: qty 1

## 2014-05-08 MED ORDER — HYDROMORPHONE HCL PF 1 MG/ML IJ SOLN
1.0000 mg | Freq: Once | INTRAMUSCULAR | Status: AC
  Administered 2014-05-09: 1 mg via INTRAMUSCULAR
  Filled 2014-05-08: qty 1

## 2014-05-08 MED ORDER — KETOROLAC TROMETHAMINE 30 MG/ML IJ SOLN
30.0000 mg | Freq: Once | INTRAMUSCULAR | Status: AC
  Administered 2014-05-09: 30 mg via INTRAMUSCULAR
  Filled 2014-05-08: qty 1

## 2014-05-08 NOTE — ED Notes (Signed)
Pt brought in by EMS with c/o back pain  Pt was getting out of the car and turned to get out and felt and heard a pop in her back   Pt states she has degenerative spine disease and her back "locks up" on her occasionally  Pt is c/o low back pain

## 2014-05-08 NOTE — ED Provider Notes (Signed)
CSN: [id number]     Arrival date & time 05/08/14  2147 History   First MD Initiated Contact with Patient 05/08/14 2314     Chief Complaint  Patient presents with  . Back Pain     (Consider location/radiation/quality/duration/timing/severity/associated sxs/prior Treatment) HPI Pt presenting with acute onset of low back pain.  She states pain began when she was getting out of her car and she felt spasm in her low back and pain radiating to both hips. No incontinence of bowel or bladder or urinary retention.  No fever/chills.  Has had hx of similar pain in the past which was relieved by muscle relaxers and pain medication.  No specific  Trauma to back. There are no other associated systemic symptoms, there are no other alleviating or modifying factors.   Past Medical History  Diagnosis Date  . Bronchitis   . Polycystic ovary syndrome   . Anxiety   . Depression   . ADHD (attention deficit hyperactivity disorder)    Past Surgical History  Procedure Laterality Date  . Tubal ligation    . Hernia repair    . Plantar fascia surgery     Family History  Problem Relation Age of Onset  . Hypertension Father   . Fibromyalgia Father   . Depression Father   . Cancer Father   . Bipolar disorder Brother   . Fibromyalgia Brother    History  Substance Use Topics  . Smoking status: Former Smoker -- 2.00 packs/day for 3 years    Types: Cigarettes  . Smokeless tobacco: Never Used  . Alcohol Use: No   OB History   Grav Para Term Preterm Abortions TAB SAB Ect Mult Living   3 3 3       3      Review of Systems ROS reviewed and all otherwise negative except for mentioned in HPI    Allergies  Hydrocodone and Other  Home Medications   Prior to Admission medications   Medication Sig Start Date End Date Taking? Authorizing Provider  ARIPiprazole (ABILIFY) 10 MG tablet Take 10 mg by mouth daily.   Yes Historical Provider, MD  lisdexamfetamine (VYVANSE) 50 MG capsule Take 50 mg by mouth  every morning.   Yes Historical Provider, MD  metFORMIN (GLUCOPHAGE) 1000 MG tablet Take 1,000 mg by mouth 2 (two) times daily with a meal.    Yes Historical Provider, MD  PRESCRIPTION MEDICATION Take 1 tablet by mouth daily. Birth control   Yes Historical Provider, MD  cyclobenzaprine (FLEXERIL) 5 MG tablet Take 1 tablet (5 mg total) by mouth 3 (three) times daily as needed for muscle spasms. 05/09/14   Threasa Beards, MD  naproxen (NAPROSYN) 500 MG tablet Take 1 tablet (500 mg total) by mouth 2 (two) times daily. 05/09/14   Threasa Beards, MD  oxyCODONE-acetaminophen (PERCOCET/ROXICET) 5-325 MG per tablet Take 1-2 tablets by mouth every 6 (six) hours as needed for severe pain. 05/09/14   Threasa Beards, MD   BP 106/54  Pulse 89  Temp(Src) 97.9 F (36.6 C) (Oral)  Resp 18  SpO2 99%  LMP 03/16/2014 Vitals reviewed Physical Exam Physical Examination: General appearance - alert, well appearing, and in no distress Mental status - alert, oriented to person, place, and time Eyes - no conjunctival injection, no scleral icterus Mouth - mucous membranes moist, pharynx normal without lesions Chest - clear to auscultation, no wheezes, rales or rhonchi, symmetric air entry Heart - normal rate, regular rhythm, normal S1, S2, no  murmurs, rubs, clicks or gallops Abdomen - soft, nontender, nondistended, no masses or organomegaly Back exam -mild midline lumbar tenderness to palpation, some left sided lumbar paraspinal tenderness to palpation, no CVA tenderness bilaterally Neurological - alert, oriented, normal speech, strength 5/5 in extremities x 4, sensation intact Extremities - peripheral pulses normal, no pedal edema, no clubbing or cyanosis Skin - normal coloration and turgor, no rashes  ED Course  Procedures (including critical care time) Labs Review Labs Reviewed - No data to display  Imaging Review Dg Lumbar Spine Complete  05/09/2014   CLINICAL DATA:  Popping sensation in back when getting  out of car this morning, now with back pain and limited movement.  EXAM: LUMBAR SPINE - COMPLETE 4+ VIEW  COMPARISON:  CT discography of the lumbar spine December 27, 2013  FINDINGS: Five non rib-bearing lumbar-type vertebral bodies are intact and aligned with straightened lumbar lordosis. Intervertebral disc heights are normal. No destructive bony lesions.  Sacroiliac joints are symmetric. Included prevertebral and paraspinal soft tissue planes are non-suspicious.  IMPRESSION: Straightened lumbar lordosis without acute fracture deformity or malalignment.   Electronically Signed   By: Elon Alas   On: 05/09/2014 00:47     EKG Interpretation None      MDM   Final diagnoses:  Left-sided low back pain without sciatica    Pt presenting with acute onset of left lower back pain after getting out of a car earlier today.  No significant trauma.  No signs or symptoms of cauda equina, no signs of epidural abscess.  Pt with some paraspinal tendenress on exam, xray reassuring.  Pt treated in the ED with pain meds, muscle relaxer, antiinflammotories.  Discharged with strict return precautions.  Pt agreeable with plan.    Threasa Beards, MD 05/09/14 [phone]

## 2014-05-09 ENCOUNTER — Emergency Department (HOSPITAL_COMMUNITY): Payer: Medicaid Other

## 2014-05-09 MED ORDER — NAPROXEN 500 MG PO TABS: 500.0000 mg | ORAL_TABLET | Freq: Two times a day (BID) | ORAL | Status: DC

## 2014-05-09 MED ORDER — OXYCODONE-ACETAMINOPHEN 5-325 MG PO TABS: 1.0000 | ORAL_TABLET | Freq: Four times a day (QID) | ORAL | Status: DC | PRN

## 2014-05-09 MED ORDER — CYCLOBENZAPRINE HCL 5 MG PO TABS: 5.0000 mg | ORAL_TABLET | Freq: Three times a day (TID) | ORAL | Status: DC | PRN

## 2014-05-09 NOTE — Discharge Instructions (Signed)
Return to the ED with any concerns including weakness of legs, not able to urinate, loss of control of bowel or bladder, fever/chills, decreased level of alertness/lethargy, or any other alarming symptoms °

## 2014-05-24 DIAGNOSIS — Z0000 Encounter for general adult medical examination without abnormal findings: Secondary | ICD-10-CM | POA: Insufficient documentation

## 2014-05-24 DIAGNOSIS — F988 Other specified behavioral and emotional disorders with onset usually occurring in childhood and adolescence: Secondary | ICD-10-CM | POA: Insufficient documentation

## 2014-05-24 DIAGNOSIS — M5136 Other intervertebral disc degeneration, lumbar region: Secondary | ICD-10-CM | POA: Insufficient documentation

## 2014-05-24 DIAGNOSIS — F39 Unspecified mood [affective] disorder: Secondary | ICD-10-CM | POA: Insufficient documentation

## 2014-05-29 ENCOUNTER — Emergency Department (HOSPITAL_COMMUNITY): Payer: Medicaid Other

## 2014-05-29 ENCOUNTER — Encounter (HOSPITAL_COMMUNITY): Payer: Self-pay | Admitting: Emergency Medicine

## 2014-05-29 ENCOUNTER — Emergency Department (HOSPITAL_COMMUNITY)
Admission: EM | Admit: 2014-05-29 | Discharge: 2014-05-29 | Discharge status: Against Medical Advice | Payer: Medicaid Other | Attending: Emergency Medicine | Admitting: Emergency Medicine

## 2014-05-29 DIAGNOSIS — M5380 Other specified dorsopathies, site unspecified: Secondary | ICD-10-CM | POA: Insufficient documentation

## 2014-05-29 DIAGNOSIS — Z79899 Other long term (current) drug therapy: Secondary | ICD-10-CM | POA: Diagnosis not present

## 2014-05-29 DIAGNOSIS — M546 Pain in thoracic spine: Secondary | ICD-10-CM | POA: Insufficient documentation

## 2014-05-29 DIAGNOSIS — M62830 Muscle spasm of back: Secondary | ICD-10-CM

## 2014-05-29 DIAGNOSIS — M47817 Spondylosis without myelopathy or radiculopathy, lumbosacral region: Secondary | ICD-10-CM | POA: Insufficient documentation

## 2014-05-29 DIAGNOSIS — M542 Cervicalgia: Secondary | ICD-10-CM | POA: Diagnosis not present

## 2014-05-29 DIAGNOSIS — Z8709 Personal history of other diseases of the respiratory system: Secondary | ICD-10-CM | POA: Insufficient documentation

## 2014-05-29 DIAGNOSIS — Z8742 Personal history of other diseases of the female genital tract: Secondary | ICD-10-CM | POA: Insufficient documentation

## 2014-05-29 DIAGNOSIS — M25519 Pain in unspecified shoulder: Secondary | ICD-10-CM | POA: Insufficient documentation

## 2014-05-29 DIAGNOSIS — F909 Attention-deficit hyperactivity disorder, unspecified type: Secondary | ICD-10-CM | POA: Insufficient documentation

## 2014-05-29 DIAGNOSIS — Z87891 Personal history of nicotine dependence: Secondary | ICD-10-CM | POA: Diagnosis not present

## 2014-05-29 MED ORDER — IBUPROFEN 800 MG PO TABS
800.0000 mg | ORAL_TABLET | Freq: Once | ORAL | Status: AC
  Administered 2014-05-29: 800 mg via ORAL
  Filled 2014-05-29: qty 1

## 2014-05-29 MED ORDER — DIAZEPAM 5 MG PO TABS: 5.0000 mg | ORAL_TABLET | Freq: Four times a day (QID) | ORAL | Status: DC | PRN

## 2014-05-29 NOTE — Discharge Instructions (Signed)
Continue taking naprosyn and percocet.   Switch flexeril to valium as prescribed. Do NOT drive with it.   Follow up with your orthopedic doctor.   Use sling for comfort.   Return to ER if you have severe pain, worse spasms.

## 2014-05-29 NOTE — ED Notes (Signed)
Pt states that she has degenerative disc and yesterday having back pain, today has gotten worse to where she cant move her left arm, or when she turns or moves her neck.  Pt has been taking flexeril but not helping.

## 2014-05-29 NOTE — ED Notes (Signed)
md at bedside

## 2014-05-29 NOTE — ED Provider Notes (Signed)
CSN: [id number]     Arrival date & time 05/29/14  6599 History   First MD Initiated Contact with Patient 05/29/14 1025     Chief Complaint  Patient presents with  . Back Pain  . Shoulder Pain  . Neck Injury     (Consider location/radiation/quality/duration/timing/severity/associated sxs/prior Treatment) The history is provided by the patient.  Stacy Deleon is a 33 y.o. female hx of arthritis, anxiety, depression here with upper back pain. Has done arthritis on lower back. However, since yesterday, had increasing left upper back pain. She tried to move the left shoulder more yesterday and today the pain is worse. No trauma or injury. Denies chest pain or shortness of breath. Unable to move shoulder due to pain in her back. Took oxycodone and flexeril with no relief.    Past Medical History  Diagnosis Date  . Bronchitis   . Polycystic ovary syndrome   . Anxiety   . Depression   . ADHD (attention deficit hyperactivity disorder)    Past Surgical History  Procedure Laterality Date  . Tubal ligation    . Hernia repair    . Plantar fascia surgery     Family History  Problem Relation Age of Onset  . Hypertension Father   . Fibromyalgia Father   . Depression Father   . Cancer Father   . Bipolar disorder Brother   . Fibromyalgia Brother    History  Substance Use Topics  . Smoking status: Former Smoker -- 2.00 packs/day for 3 years    Types: Cigarettes  . Smokeless tobacco: Never Used  . Alcohol Use: No   OB History   Grav Para Term Preterm Abortions TAB SAB Ect Mult Living   3 3 3       3      Review of Systems  Musculoskeletal: Positive for back pain.  All other systems reviewed and are negative.     Allergies  Hydrocodone and Other  Home Medications   Prior to Admission medications   Medication Sig Start Date End Date Taking? Authorizing Provider  cyclobenzaprine (FLEXERIL) 5 MG tablet Take 1 tablet (5 mg total) by mouth 3 (three) times daily as needed for  muscle spasms. 05/09/14  Yes Threasa Beards, MD  lisdexamfetamine (VYVANSE) 50 MG capsule Take 50 mg by mouth every morning.   Yes Historical Provider, MD  metFORMIN (GLUCOPHAGE) 1000 MG tablet Take 1,000 mg by mouth 2 (two) times daily with a meal.    Yes Historical Provider, MD  Multiple Vitamin (MULTIVITAMIN WITH MINERALS) TABS tablet Take 1 tablet by mouth daily.   Yes Historical Provider, MD  ondansetron (ZOFRAN-ODT) 4 MG disintegrating tablet Take 4 mg by mouth every 8 (eight) hours as needed for nausea.  05/12/14  Yes Historical Provider, MD  oxyCODONE-acetaminophen (PERCOCET/ROXICET) 5-325 MG per tablet Take 1-2 tablets by mouth every 6 (six) hours as needed for severe pain. 05/09/14  Yes Threasa Beards, MD   BP 123/71  Pulse 88  Temp(Src) 99 F (37.2 C) (Oral)  Resp 18  SpO2 99%  LMP 04/18/2014 Physical Exam  Nursing note and vitals reviewed. Constitutional: She is oriented to person, place, and time.  Uncomfortable   HENT:  Head: Normocephalic.  Mouth/Throat: Oropharynx is clear and moist.  Eyes: Conjunctivae and EOM are normal. Pupils are equal, round, and reactive to light.  Neck: Normal range of motion. Neck supple.  Cardiovascular: Normal rate, regular rhythm and normal heart sounds.   Pulmonary/Chest: Effort normal and breath  sounds normal. No respiratory distress. She has no wheezes. She has no rales.  Abdominal: Soft. Bowel sounds are normal. She exhibits no distension. There is no tenderness. There is no rebound.  Musculoskeletal:  L trapezius tenderness. Parathoracic and paracervical tenderness. No tenderness on shoulder. Neurovascular intact   Neurological: She is alert and oriented to person, place, and time.  Skin: Skin is warm and dry.  Psychiatric: She has a normal mood and affect. Her behavior is normal. Judgment and thought content normal.    ED Course  Procedures (including critical care time) Labs Review Labs Reviewed - No data to display  Imaging  Review Dg Cervical Spine Complete  05/29/2014   CLINICAL DATA:  Pain in the cervical and thoracic spine. Numbness and tingling in fingers.  EXAM: CERVICAL SPINE  4+ VIEWS  COMPARISON:  07/26/2013, 07/17/2010  FINDINGS: There is no evidence of cervical spine fracture or prevertebral soft tissue swelling. Alignment is normal. No other significant bone abnormalities are identified.  IMPRESSION: Negative cervical spine radiographs.   Electronically Signed   By: Shon Hale M.D.   On: 05/29/2014 12:01   Dg Thoracic Spine 2 View  05/29/2014   CLINICAL DATA:  Complains of pain in the thoracic spine.  EXAM: THORACIC SPINE - 2 VIEW  COMPARISON:  07/26/2013 and cervical spine 05/29/2014  FINDINGS: Normal alignment of the thoracic spine. Vertebral body heights are maintained. Visualized ribs are intact.  IMPRESSION: No acute bone abnormality in the thoracic spine.   Electronically Signed   By: Markus Daft M.D.   On: 05/29/2014 11:59     EKG Interpretation None      MDM   Final diagnoses:  None    Stacy Deleon is a 33 y.o. female here with L back pain. Likely muscle spasms. Also consider radiculopathy. Will get spine xrays. Can change flexeril to valium.   12:07 PM Xray showed no arthritis. Will change flexeril to valium. Has naprosyn and oxycodone at home. Has ortho f/u next week.      Wandra Arthurs, MD 05/29/14 [phone]

## 2014-05-29 NOTE — ED Notes (Addendum)
Pt alert and oriented x4. Respirations even and unlabored, bilateral symmetrical rise and fall of chest. Skin warm and dry. In no acute distress. Denies needs.  md at bedside 

## 2014-05-29 NOTE — ED Notes (Signed)
Marianna Payment rn went in to discharge pt, pt left after md talked to her about results. Pt did not get discharge paperwork.

## 2014-06-01 ENCOUNTER — Ambulatory Visit: Payer: Medicaid Other | Admitting: Internal Medicine

## 2014-06-14 ENCOUNTER — Other Ambulatory Visit: Payer: Self-pay | Admitting: Physician Assistant

## 2014-06-14 DIAGNOSIS — G8929 Other chronic pain: Secondary | ICD-10-CM

## 2014-06-14 DIAGNOSIS — M5416 Radiculopathy, lumbar region: Principal | ICD-10-CM

## 2014-06-15 ENCOUNTER — Ambulatory Visit
Admission: RE | Admit: 2014-06-15 | Discharge: 2014-06-15 | Disposition: A | Discharge status: Home / Self Care | Payer: Medicaid Other | Source: Ambulatory Visit | Attending: Physician Assistant | Admitting: Physician Assistant

## 2014-06-15 DIAGNOSIS — M5416 Radiculopathy, lumbar region: Principal | ICD-10-CM

## 2014-06-15 DIAGNOSIS — G8929 Other chronic pain: Secondary | ICD-10-CM

## 2014-06-15 MED ORDER — METHYLPREDNISOLONE ACETATE 40 MG/ML INJ SUSP (RADIOLOG
120.0000 mg | Freq: Once | INTRAMUSCULAR | Status: AC
  Administered 2014-06-15: 120 mg via EPIDURAL

## 2014-06-15 MED ORDER — IOHEXOL 180 MG/ML  SOLN
1.0000 mL | Freq: Once | INTRAMUSCULAR | Status: AC | PRN
  Administered 2014-06-15: 1 mL via EPIDURAL

## 2014-06-15 NOTE — Discharge Instructions (Signed)

## 2014-07-04 ENCOUNTER — Encounter (HOSPITAL_COMMUNITY): Payer: Self-pay | Admitting: Emergency Medicine

## 2014-08-30 ENCOUNTER — Other Ambulatory Visit: Payer: Self-pay | Admitting: Physical Medicine and Rehabilitation

## 2014-08-30 DIAGNOSIS — M5126 Other intervertebral disc displacement, lumbar region: Secondary | ICD-10-CM

## 2014-09-05 ENCOUNTER — Ambulatory Visit
Admission: RE | Admit: 2014-09-05 | Discharge: 2014-09-05 | Disposition: A | Discharge status: Home / Self Care | Payer: Medicaid Other | Source: Ambulatory Visit | Attending: Physical Medicine and Rehabilitation | Admitting: Physical Medicine and Rehabilitation

## 2014-09-05 DIAGNOSIS — M5126 Other intervertebral disc displacement, lumbar region: Secondary | ICD-10-CM

## 2014-09-05 MED ORDER — IOHEXOL 180 MG/ML  SOLN
1.0000 mL | Freq: Once | INTRAMUSCULAR | Status: AC | PRN
  Administered 2014-09-05: 1 mL via EPIDURAL

## 2014-09-05 MED ORDER — METHYLPREDNISOLONE ACETATE 40 MG/ML INJ SUSP (RADIOLOG
120.0000 mg | Freq: Once | INTRAMUSCULAR | Status: AC
  Administered 2014-09-05: 120 mg via EPIDURAL

## 2014-10-05 ENCOUNTER — Emergency Department (HOSPITAL_COMMUNITY)
Admission: EM | Admit: 2014-10-05 | Discharge: 2014-10-05 | Disposition: A | Discharge status: Home / Self Care | Payer: Medicaid Other | Attending: Emergency Medicine | Admitting: Emergency Medicine

## 2014-10-05 ENCOUNTER — Encounter (HOSPITAL_COMMUNITY): Payer: Self-pay | Admitting: *Deleted

## 2014-10-05 DIAGNOSIS — M549 Dorsalgia, unspecified: Secondary | ICD-10-CM | POA: Diagnosis present

## 2014-10-05 DIAGNOSIS — Z9851 Tubal ligation status: Secondary | ICD-10-CM | POA: Diagnosis not present

## 2014-10-05 DIAGNOSIS — M545 Low back pain: Secondary | ICD-10-CM | POA: Insufficient documentation

## 2014-10-05 DIAGNOSIS — Z87891 Personal history of nicotine dependence: Secondary | ICD-10-CM | POA: Diagnosis not present

## 2014-10-05 DIAGNOSIS — F329 Major depressive disorder, single episode, unspecified: Secondary | ICD-10-CM | POA: Insufficient documentation

## 2014-10-05 DIAGNOSIS — Z9889 Other specified postprocedural states: Secondary | ICD-10-CM | POA: Insufficient documentation

## 2014-10-05 DIAGNOSIS — F909 Attention-deficit hyperactivity disorder, unspecified type: Secondary | ICD-10-CM | POA: Insufficient documentation

## 2014-10-05 DIAGNOSIS — Z8639 Personal history of other endocrine, nutritional and metabolic disease: Secondary | ICD-10-CM | POA: Insufficient documentation

## 2014-10-05 DIAGNOSIS — Z8709 Personal history of other diseases of the respiratory system: Secondary | ICD-10-CM | POA: Diagnosis not present

## 2014-10-05 MED ORDER — HYDROMORPHONE HCL 1 MG/ML IJ SOLN
1.0000 mg | Freq: Once | INTRAMUSCULAR | Status: AC
  Administered 2014-10-05: 1 mg via INTRAMUSCULAR
  Filled 2014-10-05: qty 1

## 2014-10-05 NOTE — ED Provider Notes (Signed)
CSN: [id number]     Arrival date & time 10/05/14  1750 History  This chart was scribed for Quincy Carnes, PA-C, working with Arbie Cookey, MD by Steva Colder, ED Scribe. The patient was seen in room WTR6/WTR6 at 6:50 PM.    Chief Complaint  Patient presents with  . Back Pain    The history is provided by the patient. No language interpreter was used.   HPI Comments: Stacy Deleon is a 34 y.o. female with a medical hx of chronic back pain who presents to the Emergency Department complaining of back pain with radiation into hips and legs onset 4 days. She notes that she has a ruptured disc at L4 and L5 and her sciatic nerve is compressed.  She is having a discectomy at [facility] next week by Dr. Rolena Infante. States in preparation for PCP Dr. Rolena Infante has changed her pain medications from percocet to vicodin.  Since change in medications she has had increased pain.  No new injuries, trauma, or falls.  She tried to call her PCP and request pain medications but they didn't get back to her in time. She reports that it is painful to stand but it is more painful to sit because of the compressed nerve.  She states because of the compressed nerves she has intermittent bladder incontinence, weakness in the left leg, and nausea at baseline.  She denies any changes in these symptoms and Dr. Rolena Infante is aware of these issues. No bladder incontinence today.  She states that she has tried Hydrocodone with no relief for her symptoms. She denies vomiting, fever, chills, sweats.  VSS on arrival.  Past Medical History  Diagnosis Date  . Bronchitis   . Polycystic ovary syndrome   . Anxiety   . Depression   . ADHD (attention deficit hyperactivity disorder)    Past Surgical History  Procedure Laterality Date  . Tubal ligation    . Hernia repair    . Plantar fascia surgery     Family History  Problem Relation Age of Onset  . Hypertension Father   . Fibromyalgia Father   . Depression Father   . Cancer  Father   . Bipolar disorder Brother   . Fibromyalgia Brother    History  Substance Use Topics  . Smoking status: Former Smoker -- 2.00 packs/day for 3 years    Types: Cigarettes  . Smokeless tobacco: Never Used  . Alcohol Use: No   OB History    Gravida Para Term Preterm AB TAB SAB Ectopic Multiple Living   3 3 3       3      Review of Systems  Musculoskeletal: Positive for back pain.  All other systems reviewed and are negative.     Allergies  Hydrocodone and Other  Home Medications   Prior to Admission medications   Medication Sig Start Date End Date Taking? Authorizing Provider  HYDROcodone-acetaminophen (NORCO/VICODIN) 5-325 MG per tablet Take 1 tablet by mouth every 4 (four) hours as needed for moderate pain.   Yes Historical Provider, MD  lisdexamfetamine (VYVANSE) 50 MG capsule Take 50 mg by mouth every morning.   Yes Historical Provider, MD  omeprazole (PRILOSEC) 20 MG capsule Take 20 mg by mouth every morning.   Yes Historical Provider, MD  cyclobenzaprine (FLEXERIL) 5 MG tablet Take 1 tablet (5 mg total) by mouth 3 (three) times daily as needed for muscle spasms. Patient not taking: Reported on 10/05/2014 05/09/14   Threasa Beards,  MD  diazepam (VALIUM) 5 MG tablet Take 1 tablet (5 mg total) by mouth every 6 (six) hours as needed for anxiety (spasms). Patient not taking: Reported on 10/05/2014 05/29/14   Wandra Arthurs, MD  oxyCODONE-acetaminophen (PERCOCET/ROXICET) 5-325 MG per tablet Take 1-2 tablets by mouth every 6 (six) hours as needed for severe pain. Patient not taking: Reported on 10/05/2014 05/09/14   Threasa Beards, MD   BP 117/74 mmHg  Pulse 103  Temp(Src) 98.3 F (36.8 C) (Oral)  Resp 18  SpO2 97%  LMP 08/17/2014  Physical Exam  Constitutional: She is oriented to person, place, and time. She appears well-developed and well-nourished. No distress.  HENT:  Head: Normocephalic and atraumatic.  Mouth/Throat: Oropharynx is clear and moist.  Eyes: Conjunctivae  and EOM are normal. Pupils are equal, round, and reactive to light.  Neck: Normal range of motion. Neck supple.  Cardiovascular: Normal rate, regular rhythm and normal heart sounds.   Pulmonary/Chest: Effort normal and breath sounds normal.  Abdominal: Soft. Bowel sounds are normal. She exhibits no distension and no mass. There is no tenderness. There is no rebound and no guarding.  Musculoskeletal: Normal range of motion.  Midline lumbar tenderness noted, no step-off or bony deformities, limited range of motion secondary to pain, normal strength and sensation of bilateral lower extremities  Neurological: She is alert and oriented to person, place, and time.  Skin: Skin is warm and dry. She is not diaphoretic.  Psychiatric: She has a normal mood and affect.  Nursing note and vitals reviewed.   ED Course  Procedures (including critical care time) DIAGNOSTIC STUDIES: Oxygen Saturation is 97% on room air, normal by my interpretation.    COORDINATION OF CARE: 7:00 PM-Discussed treatment plan which includes dilaudid injection with pt at bedside and pt agreed to plan.   Labs Review Labs Reviewed - No data to display  Imaging Review No results found.   EKG Interpretation None      MDM   Final diagnoses:  Back pain, unspecified location   34 year old female with chronic low back pain. She is scheduled for discectomy next week by Dr. Rolena Infante. She notes increase of her pain since being switched from oxycodone to hydrocodone in preparation for surgery. She does note intermittent left leg numbness and bladder incontinence at baseline due to known nerve compression however neither of these issues are new and Dr. Rolena Infante is aware of this. She has had no bladder incontinence today. No new red flag symptoms today. She is ambulatory without difficulty.  Patient given a shot of Dilaudid and will follow up with Dr. Rolena Infante as scheduled.  Discussed plan with patient, he/she acknowledged understanding  and agreed with plan of care.  Return precautions given for new or worsening symptoms.  I personally performed the services described in this documentation, which was scribed in my presence. The recorded information has been reviewed and is accurate.   Larene Pickett, PA-C 10/05/14 [facility], MD 10/06/14 [phone]

## 2014-10-05 NOTE — ED Notes (Addendum)
Pt complains of back pain for the past 3 years, which she states has gotten worse in the past 4 days. Pt states she is having back surgery next Thursday, but cannot stand the pain. Pt states she took percocet for pain at [facility], which she states has not helped. Pain is 10/10

## 2014-10-05 NOTE — Discharge Instructions (Signed)
Continue your home norco. Follow-up with Dr. Rolena Infante as scheduled. Return to the ED for new concerns.

## 2014-10-11 ENCOUNTER — Encounter (HOSPITAL_COMMUNITY): Payer: Self-pay | Admitting: Pharmacy Technician

## 2014-10-11 NOTE — Pre-Procedure Instructions (Signed)
FATOUMATA ALBAUGH  10/11/2014   Your procedure is scheduled on: Thursday, February 11.  Report to [facility] Admitting at 8:20 AM.  Call this number if you have problems the morning of surgery: [phone]   Remember:   Do not eat food or drink liquids after midnight.   Take these medicines the morning of surgery with A SIP OF WATER: omeprazole (PRILOSEC).                    Take if needed:HYDROcodone-acetaminophen (NORCO/VICODIN), promethazine (PHENERGAN).   Do not wear jewelry, make-up or nail polish.  Do not wear lotions, powders, or perfumes.   Do not shave 48 hours prior to surgery.   Do not bring valuables to the hospital.              [facility] is not responsible  for any belongings or valuables.               Contacts, dentures or bridgework may not be worn into surgery.  Leave suitcase in the car. After surgery it may be brought to your room.  For patients admitted to the hospital, discharge time is determined by your  treatment team.               Patients discharged the day of surgery will not be allowed to drive home.  Name and phone number of your driver: -   Special Instructions: Review  [facility] - Preparing For Surgery.   Please read over the following fact sheets that you were given: Pain Booklet, Coughing and Deep Breathing and Surgical Site Infection Prevention

## 2014-10-12 ENCOUNTER — Encounter (HOSPITAL_COMMUNITY): Payer: Self-pay

## 2014-10-12 ENCOUNTER — Encounter (HOSPITAL_COMMUNITY)
Admission: RE | Admit: 2014-10-12 | Discharge: 2014-10-12 | Disposition: A | Discharge status: Home / Self Care | Payer: Medicaid Other | Source: Ambulatory Visit | Attending: Orthopedic Surgery | Admitting: Orthopedic Surgery

## 2014-10-12 DIAGNOSIS — Z6836 Body mass index (BMI) 36.0-36.9, adult: Secondary | ICD-10-CM | POA: Diagnosis not present

## 2014-10-12 DIAGNOSIS — G43909 Migraine, unspecified, not intractable, without status migrainosus: Secondary | ICD-10-CM | POA: Diagnosis not present

## 2014-10-12 DIAGNOSIS — F419 Anxiety disorder, unspecified: Secondary | ICD-10-CM | POA: Diagnosis not present

## 2014-10-12 DIAGNOSIS — F329 Major depressive disorder, single episode, unspecified: Secondary | ICD-10-CM | POA: Diagnosis not present

## 2014-10-12 DIAGNOSIS — Z87891 Personal history of nicotine dependence: Secondary | ICD-10-CM | POA: Diagnosis not present

## 2014-10-12 DIAGNOSIS — K219 Gastro-esophageal reflux disease without esophagitis: Secondary | ICD-10-CM | POA: Diagnosis not present

## 2014-10-12 DIAGNOSIS — M5116 Intervertebral disc disorders with radiculopathy, lumbar region: Secondary | ICD-10-CM | POA: Diagnosis not present

## 2014-10-12 DIAGNOSIS — E669 Obesity, unspecified: Secondary | ICD-10-CM | POA: Diagnosis not present

## 2014-10-12 HISTORY — DX: Unspecified urinary incontinence: R32

## 2014-10-12 HISTORY — DX: Myoneural disorder, unspecified: G70.9

## 2014-10-12 HISTORY — DX: Other specified postprocedural states: Z98.890

## 2014-10-12 HISTORY — DX: Gastro-esophageal reflux disease without esophagitis: K21.9

## 2014-10-12 HISTORY — DX: Other specified postprocedural states: R11.2

## 2014-10-12 LAB — SURGICAL PCR SCREEN
MRSA, PCR: NEGATIVE
Staphylococcus aureus: NEGATIVE

## 2014-10-12 LAB — CBC
HCT: 40.6 % (ref 36.0–46.0)
Hemoglobin: 13.5 g/dL (ref 12.0–15.0)
MCH: 28.3 pg (ref 26.0–34.0)
MCHC: 33.3 g/dL (ref 30.0–36.0)
MCV: 85.1 fL (ref 78.0–100.0)
PLATELETS: 368 10*3/uL (ref 150–400)
RBC: 4.77 MIL/uL (ref 3.87–5.11)
RDW: 12.3 % (ref 11.5–15.5)
WBC: 7.5 10*3/uL (ref 4.0–10.5)

## 2014-10-12 LAB — BASIC METABOLIC PANEL
Anion gap: 7 (ref 5–15)
BUN: 9 mg/dL (ref 6–23)
CO2: 26 mmol/L (ref 19–32)
Calcium: 9.5 mg/dL (ref 8.4–10.5)
Chloride: 103 mmol/L (ref 96–112)
Creatinine, Ser: 0.84 mg/dL (ref 0.50–1.10)
GFR calc Af Amer: >90 mL/min (ref >90)
GLUCOSE: 94 mg/dL (ref 70–99)
Potassium: 4.6 mmol/L (ref 3.5–5.1)
SODIUM: 136 mmol/L (ref 135–145)

## 2014-10-12 LAB — HCG, SERUM, QUALITATIVE: Preg, Serum: NEGATIVE

## 2014-10-12 MED ORDER — DEXAMETHASONE SODIUM PHOSPHATE 4 MG/ML IJ SOLN
8.0000 mg | Freq: Once | INTRAMUSCULAR | Status: AC
  Administered 2014-10-13: 8 mg via INTRAVENOUS
  Filled 2014-10-12: qty 2

## 2014-10-12 MED ORDER — CEFAZOLIN SODIUM-DEXTROSE 2-3 GM-% IV SOLR
2.0000 g | INTRAVENOUS | Status: AC
  Administered 2014-10-13: 2 g via INTRAVENOUS
  Filled 2014-10-12: qty 50

## 2014-10-12 MED ORDER — ACETAMINOPHEN 10 MG/ML IV SOLN
1000.0000 mg | Freq: Four times a day (QID) | INTRAVENOUS | Status: DC
  Administered 2014-10-13: 1000 mg via INTRAVENOUS
  Filled 2014-10-12: qty 100

## 2014-10-13 ENCOUNTER — Ambulatory Visit (HOSPITAL_COMMUNITY): Payer: Medicaid Other | Admitting: Anesthesiology

## 2014-10-13 ENCOUNTER — Encounter (HOSPITAL_COMMUNITY)
Admission: RE | Disposition: A | Discharge status: Home / Self Care | Payer: Self-pay | Source: Ambulatory Visit | Attending: Orthopedic Surgery

## 2014-10-13 ENCOUNTER — Observation Stay (HOSPITAL_COMMUNITY)
Admission: RE | Admit: 2014-10-13 | Discharge: 2014-10-14 | Disposition: A | Discharge status: Home / Self Care | Payer: Medicaid Other | Source: Ambulatory Visit | Attending: Orthopedic Surgery | Admitting: Orthopedic Surgery

## 2014-10-13 ENCOUNTER — Encounter (HOSPITAL_COMMUNITY): Payer: Self-pay | Admitting: *Deleted

## 2014-10-13 ENCOUNTER — Ambulatory Visit (HOSPITAL_COMMUNITY): Payer: Medicaid Other

## 2014-10-13 DIAGNOSIS — F329 Major depressive disorder, single episode, unspecified: Secondary | ICD-10-CM | POA: Insufficient documentation

## 2014-10-13 DIAGNOSIS — K219 Gastro-esophageal reflux disease without esophagitis: Secondary | ICD-10-CM | POA: Insufficient documentation

## 2014-10-13 DIAGNOSIS — E669 Obesity, unspecified: Secondary | ICD-10-CM | POA: Insufficient documentation

## 2014-10-13 DIAGNOSIS — M5116 Intervertebral disc disorders with radiculopathy, lumbar region: Principal | ICD-10-CM | POA: Insufficient documentation

## 2014-10-13 DIAGNOSIS — F419 Anxiety disorder, unspecified: Secondary | ICD-10-CM | POA: Diagnosis not present

## 2014-10-13 DIAGNOSIS — Z419 Encounter for procedure for purposes other than remedying health state, unspecified: Secondary | ICD-10-CM

## 2014-10-13 DIAGNOSIS — Z87891 Personal history of nicotine dependence: Secondary | ICD-10-CM | POA: Insufficient documentation

## 2014-10-13 DIAGNOSIS — Z6836 Body mass index (BMI) 36.0-36.9, adult: Secondary | ICD-10-CM | POA: Insufficient documentation

## 2014-10-13 DIAGNOSIS — M5126 Other intervertebral disc displacement, lumbar region: Secondary | ICD-10-CM | POA: Diagnosis present

## 2014-10-13 DIAGNOSIS — G43909 Migraine, unspecified, not intractable, without status migrainosus: Secondary | ICD-10-CM | POA: Insufficient documentation

## 2014-10-13 HISTORY — PX: LUMBAR LAMINECTOMY/DECOMPRESSION MICRODISCECTOMY: SHX5026

## 2014-10-13 SURGERY — LUMBAR LAMINECTOMY/DECOMPRESSION MICRODISCECTOMY 1 LEVEL
Anesthesia: General | Site: Spine Lumbar | Laterality: Left

## 2014-10-13 MED ORDER — ONDANSETRON HCL 4 MG PO TABS: 4.0000 mg | ORAL_TABLET | Freq: Three times a day (TID) | ORAL | Status: DC | PRN

## 2014-10-13 MED ORDER — MIDAZOLAM HCL 2 MG/2ML IJ SOLN
INTRAMUSCULAR | Status: AC
  Filled 2014-10-13: qty 2

## 2014-10-13 MED ORDER — PROPOFOL 10 MG/ML IV BOLUS
INTRAVENOUS | Status: AC
  Filled 2014-10-13: qty 20

## 2014-10-13 MED ORDER — FENTANYL CITRATE 0.05 MG/ML IJ SOLN
INTRAMUSCULAR | Status: AC
  Filled 2014-10-13: qty 5

## 2014-10-13 MED ORDER — OXYCODONE HCL 5 MG PO TABS
10.0000 mg | ORAL_TABLET | ORAL | Status: DC | PRN
  Administered 2014-10-13 – 2014-10-14 (×4): 10 mg via ORAL
  Filled 2014-10-13 (×3): qty 2

## 2014-10-13 MED ORDER — ONDANSETRON HCL 4 MG/2ML IJ SOLN
4.0000 mg | INTRAMUSCULAR | Status: DC | PRN
  Administered 2014-10-13: 4 mg via INTRAVENOUS
  Filled 2014-10-13: qty 2

## 2014-10-13 MED ORDER — ONDANSETRON HCL 4 MG/2ML IJ SOLN
INTRAMUSCULAR | Status: AC
  Filled 2014-10-13: qty 2

## 2014-10-13 MED ORDER — MORPHINE SULFATE 2 MG/ML IJ SOLN
1.0000 mg | INTRAMUSCULAR | Status: DC | PRN
Start: 2014-10-13 — End: 2014-10-14
  Administered 2014-10-13 – 2014-10-14 (×5): 2 mg via INTRAVENOUS
  Filled 2014-10-13 (×5): qty 1

## 2014-10-13 MED ORDER — METHOCARBAMOL 500 MG PO TABS
ORAL_TABLET | ORAL | Status: AC
  Administered 2014-10-13: 500 mg
  Filled 2014-10-13: qty 1

## 2014-10-13 MED ORDER — LACTATED RINGERS IV SOLN: INTRAVENOUS | Status: DC

## 2014-10-13 MED ORDER — PHENOL 1.4 % MT LIQD: 1.0000 | OROMUCOSAL | Status: DC | PRN

## 2014-10-13 MED ORDER — MENTHOL 3 MG MT LOZG: 1.0000 | LOZENGE | OROMUCOSAL | Status: DC | PRN

## 2014-10-13 MED ORDER — DIPHENHYDRAMINE HCL 25 MG PO CAPS: 25.0000 mg | ORAL_CAPSULE | ORAL | Status: DC | PRN

## 2014-10-13 MED ORDER — ONDANSETRON HCL 4 MG/2ML IJ SOLN
4.0000 mg | Freq: Once | INTRAMUSCULAR | Status: AC | PRN
  Administered 2014-10-13: 4 mg via INTRAVENOUS

## 2014-10-13 MED ORDER — METHOCARBAMOL 1000 MG/10ML IJ SOLN
500.0000 mg | Freq: Four times a day (QID) | INTRAVENOUS | Status: DC | PRN
  Filled 2014-10-13: qty 5

## 2014-10-13 MED ORDER — HYDROMORPHONE HCL 1 MG/ML IJ SOLN
INTRAMUSCULAR | Status: AC
  Filled 2014-10-13: qty 1

## 2014-10-13 MED ORDER — GLYCOPYRROLATE 0.2 MG/ML IJ SOLN
INTRAMUSCULAR | Status: DC | PRN
  Administered 2014-10-13: 0.4 mg via INTRAVENOUS

## 2014-10-13 MED ORDER — SODIUM CHLORIDE 0.9 % IJ SOLN: 3.0000 mL | INTRAMUSCULAR | Status: DC | PRN

## 2014-10-13 MED ORDER — ROCURONIUM BROMIDE 50 MG/5ML IV SOLN
INTRAVENOUS | Status: AC
  Filled 2014-10-13: qty 1

## 2014-10-13 MED ORDER — NEOSTIGMINE METHYLSULFATE 10 MG/10ML IV SOLN
INTRAVENOUS | Status: DC | PRN
  Administered 2014-10-13: 3 mg via INTRAVENOUS

## 2014-10-13 MED ORDER — LIDOCAINE HCL (CARDIAC) 20 MG/ML IV SOLN
INTRAVENOUS | Status: DC | PRN
  Administered 2014-10-13: 100 mg via INTRAVENOUS

## 2014-10-13 MED ORDER — METHOCARBAMOL 500 MG PO TABS
500.0000 mg | ORAL_TABLET | Freq: Four times a day (QID) | ORAL | Status: DC | PRN
  Administered 2014-10-13 – 2014-10-14 (×2): 500 mg via ORAL
  Filled 2014-10-13 (×2): qty 1

## 2014-10-13 MED ORDER — METHOCARBAMOL 500 MG PO TABS: 500.0000 mg | ORAL_TABLET | Freq: Three times a day (TID) | ORAL | Status: DC | PRN

## 2014-10-13 MED ORDER — DEXAMETHASONE SODIUM PHOSPHATE 4 MG/ML IJ SOLN
6.0000 mg | Freq: Four times a day (QID) | INTRAMUSCULAR | Status: AC
  Administered 2014-10-13 – 2014-10-14 (×3): 6 mg via INTRAVENOUS
  Filled 2014-10-13 (×3): qty 2

## 2014-10-13 MED ORDER — FENTANYL CITRATE 0.05 MG/ML IJ SOLN
INTRAMUSCULAR | Status: DC | PRN
  Administered 2014-10-13: 100 ug via INTRAVENOUS
  Administered 2014-10-13: 150 ug via INTRAVENOUS

## 2014-10-13 MED ORDER — THROMBIN 20000 UNITS EX SOLR
OROMUCOSAL | Status: DC | PRN
  Administered 2014-10-13: 20 mL via TOPICAL

## 2014-10-13 MED ORDER — OXYCODONE-ACETAMINOPHEN 10-325 MG PO TABS: 1.0000 | ORAL_TABLET | ORAL | Status: DC | PRN

## 2014-10-13 MED ORDER — MEPERIDINE HCL 25 MG/ML IJ SOLN: 6.2500 mg | INTRAMUSCULAR | Status: DC | PRN

## 2014-10-13 MED ORDER — ONDANSETRON HCL 4 MG/2ML IJ SOLN
INTRAMUSCULAR | Status: DC | PRN
  Administered 2014-10-13: 4 mg via INTRAVENOUS

## 2014-10-13 MED ORDER — PROPOFOL 10 MG/ML IV BOLUS
INTRAVENOUS | Status: DC | PRN
  Administered 2014-10-13: 140 mg via INTRAVENOUS

## 2014-10-13 MED ORDER — ROCURONIUM BROMIDE 100 MG/10ML IV SOLN
INTRAVENOUS | Status: DC | PRN
  Administered 2014-10-13: 50 mg via INTRAVENOUS
  Administered 2014-10-13: 10 mg via INTRAVENOUS

## 2014-10-13 MED ORDER — LACTATED RINGERS IV SOLN
INTRAVENOUS | Status: DC | PRN
  Administered 2014-10-13: 11:00:00 via INTRAVENOUS

## 2014-10-13 MED ORDER — BUPIVACAINE-EPINEPHRINE (PF) 0.25% -1:200000 IJ SOLN
INTRAMUSCULAR | Status: AC
  Filled 2014-10-13: qty 30

## 2014-10-13 MED ORDER — LACTATED RINGERS IV SOLN
INTRAVENOUS | Status: DC
  Administered 2014-10-13: 09:00:00 via INTRAVENOUS

## 2014-10-13 MED ORDER — DEXAMETHASONE SODIUM PHOSPHATE 4 MG/ML IJ SOLN
INTRAMUSCULAR | Status: AC
  Filled 2014-10-13: qty 1

## 2014-10-13 MED ORDER — BUPIVACAINE-EPINEPHRINE 0.25% -1:200000 IJ SOLN
INTRAMUSCULAR | Status: DC | PRN
  Administered 2014-10-13: 10 mL

## 2014-10-13 MED ORDER — HEMOSTATIC AGENTS (NO CHARGE) OPTIME
TOPICAL | Status: DC | PRN
  Administered 2014-10-13: 1 via TOPICAL

## 2014-10-13 MED ORDER — OXYCODONE HCL 5 MG PO TABS
ORAL_TABLET | ORAL | Status: AC
  Filled 2014-10-13: qty 2

## 2014-10-13 MED ORDER — SODIUM CHLORIDE 0.9 % IJ SOLN
3.0000 mL | Freq: Two times a day (BID) | INTRAMUSCULAR | Status: DC
  Administered 2014-10-13 (×2): 3 mL via INTRAVENOUS

## 2014-10-13 MED ORDER — 0.9 % SODIUM CHLORIDE (POUR BTL) OPTIME
TOPICAL | Status: DC | PRN
  Administered 2014-10-13: 1000 mL

## 2014-10-13 MED ORDER — PHENYLEPHRINE HCL 10 MG/ML IJ SOLN
INTRAMUSCULAR | Status: DC | PRN
  Administered 2014-10-13: 40 ug via INTRAVENOUS

## 2014-10-13 MED ORDER — ACETAMINOPHEN 10 MG/ML IV SOLN
1000.0000 mg | Freq: Four times a day (QID) | INTRAVENOUS | Status: DC
  Administered 2014-10-13 – 2014-10-14 (×3): 1000 mg via INTRAVENOUS
  Filled 2014-10-13 (×4): qty 100

## 2014-10-13 MED ORDER — CEFAZOLIN SODIUM 1-5 GM-% IV SOLN
1.0000 g | Freq: Three times a day (TID) | INTRAVENOUS | Status: AC
  Administered 2014-10-13 – 2014-10-14 (×2): 1 g via INTRAVENOUS
  Filled 2014-10-13 (×2): qty 50

## 2014-10-13 MED ORDER — HYDROMORPHONE HCL 1 MG/ML IJ SOLN
0.2500 mg | INTRAMUSCULAR | Status: DC | PRN
  Administered 2014-10-13 (×4): 0.5 mg via INTRAVENOUS

## 2014-10-13 MED ORDER — MIDAZOLAM HCL 5 MG/5ML IJ SOLN
INTRAMUSCULAR | Status: DC | PRN
  Administered 2014-10-13 (×2): 2 mg via INTRAVENOUS

## 2014-10-13 MED ORDER — THROMBIN 20000 UNITS EX SOLR
CUTANEOUS | Status: AC
  Filled 2014-10-13: qty 20000

## 2014-10-13 MED ORDER — HYDROMORPHONE HCL 1 MG/ML IJ SOLN
INTRAMUSCULAR | Status: AC
  Administered 2014-10-13: 0.5 mg
  Filled 2014-10-13: qty 1

## 2014-10-13 MED ORDER — DOCUSATE SODIUM 100 MG PO CAPS: 100.0000 mg | ORAL_CAPSULE | Freq: Three times a day (TID) | ORAL | Status: DC | PRN

## 2014-10-13 SURGICAL SUPPLY — 60 items
BUR EGG ELITE 4.0 (BURR) IMPLANT
BUR EGG ELITE 4.0MM (BURR)
BUR MATCHSTICK NEURO 3.0 LAGG (BURR) IMPLANT
CANISTER SUCTION 2500CC (MISCELLANEOUS) ×3 IMPLANT
CLOSURE STERI-STRIP 1/2X4 (GAUZE/BANDAGES/DRESSINGS) ×1
CLSR STERI-STRIP ANTIMIC 1/2X4 (GAUZE/BANDAGES/DRESSINGS) ×2 IMPLANT
CORDS BIPOLAR (ELECTRODE) ×3 IMPLANT
COVER SURGICAL LIGHT HANDLE (MISCELLANEOUS) ×3 IMPLANT
DRAIN CHANNEL 15F RND FF W/TCR (WOUND CARE) IMPLANT
DRAPE POUCH INSTRU U-SHP 10X18 (DRAPES) ×3 IMPLANT
DRAPE SURG 17X23 STRL (DRAPES) ×3 IMPLANT
DRAPE U-SHAPE 47X51 STRL (DRAPES) ×3 IMPLANT
DRSG MEPILEX BORDER 4X8 (GAUZE/BANDAGES/DRESSINGS) ×3 IMPLANT
DURAPREP 26ML APPLICATOR (WOUND CARE) ×3 IMPLANT
ELECT BLADE 4.0 EZ CLEAN MEGAD (MISCELLANEOUS)
ELECT CAUTERY BLADE 6.4 (BLADE) ×3 IMPLANT
ELECT PENCIL ROCKER SW 15FT (MISCELLANEOUS) ×3 IMPLANT
ELECT REM PT RETURN 9FT ADLT (ELECTROSURGICAL) ×3
ELECTRODE BLDE 4.0 EZ CLN MEGD (MISCELLANEOUS) IMPLANT
ELECTRODE REM PT RTRN 9FT ADLT (ELECTROSURGICAL) ×1 IMPLANT
EVACUATOR SILICONE 100CC (DRAIN) IMPLANT
GLOVE BIO SURGEON STRL SZ 6.5 (GLOVE) ×2 IMPLANT
GLOVE BIO SURGEONS STRL SZ 6.5 (GLOVE) ×1
GLOVE BIOGEL PI IND STRL 6.5 (GLOVE) ×1 IMPLANT
GLOVE BIOGEL PI IND STRL 8 (GLOVE) ×1 IMPLANT
GLOVE BIOGEL PI IND STRL 8.5 (GLOVE) ×2 IMPLANT
GLOVE BIOGEL PI INDICATOR 6.5 (GLOVE) ×2
GLOVE BIOGEL PI INDICATOR 8 (GLOVE) ×2
GLOVE BIOGEL PI INDICATOR 8.5 (GLOVE) ×4
GLOVE ECLIPSE 8.5 STRL (GLOVE) ×3 IMPLANT
GLOVE ORTHO TXT STRL SZ7.5 (GLOVE) ×3 IMPLANT
GOWN STRL REUS W/TWL 2XL LVL3 (GOWN DISPOSABLE) ×6 IMPLANT
KIT BASIN OR (CUSTOM PROCEDURE TRAY) ×3 IMPLANT
KIT ROOM TURNOVER OR (KITS) ×3 IMPLANT
NEEDLE 22X1 1/2 (OR ONLY) (NEEDLE) ×3 IMPLANT
NEEDLE SPNL 18GX3.5 QUINCKE PK (NEEDLE) ×6 IMPLANT
NS IRRIG 1000ML POUR BTL (IV SOLUTION) ×3 IMPLANT
PACK LAMINECTOMY ORTHO (CUSTOM PROCEDURE TRAY) ×3 IMPLANT
PACK UNIVERSAL I (CUSTOM PROCEDURE TRAY) ×3 IMPLANT
PAD ARMBOARD 7.5X6 YLW CONV (MISCELLANEOUS) ×6 IMPLANT
PATTIES SURGICAL .5 X.5 (GAUZE/BANDAGES/DRESSINGS) IMPLANT
PATTIES SURGICAL .5 X1 (DISPOSABLE) ×3 IMPLANT
SPONGE SURGIFOAM ABS GEL 100 (HEMOSTASIS) ×3 IMPLANT
SURGIFLO TRUKIT (HEMOSTASIS) ×3 IMPLANT
SUT BONE WAX W31G (SUTURE) ×3 IMPLANT
SUT MON AB 3-0 SH 27 (SUTURE) ×2
SUT MON AB 3-0 SH27 (SUTURE) ×1 IMPLANT
SUT VIC AB 0 CT1 27 (SUTURE) ×2
SUT VIC AB 0 CT1 27XBRD ANBCTR (SUTURE) ×1 IMPLANT
SUT VIC AB 1 CT1 18XCR BRD 8 (SUTURE) ×1 IMPLANT
SUT VIC AB 1 CT1 8-18 (SUTURE) ×2
SUT VIC AB 1 CTX 36 (SUTURE) ×4
SUT VIC AB 1 CTX36XBRD ANBCTR (SUTURE) ×2 IMPLANT
SUT VIC AB 2-0 CT1 18 (SUTURE) ×3 IMPLANT
SYR BULB IRRIGATION 50ML (SYRINGE) ×3 IMPLANT
SYR CONTROL 10ML LL (SYRINGE) ×3 IMPLANT
TOWEL OR 17X24 6PK STRL BLUE (TOWEL DISPOSABLE) ×3 IMPLANT
TOWEL OR 17X26 10 PK STRL BLUE (TOWEL DISPOSABLE) ×3 IMPLANT
WATER STERILE IRR 1000ML POUR (IV SOLUTION) ×3 IMPLANT
YANKAUER SUCT BULB TIP NO VENT (SUCTIONS) ×3 IMPLANT

## 2014-10-13 NOTE — Plan of Care (Signed)
Problem: Consults Goal: Diagnosis - Spinal Surgery Outcome: Completed/Met Date Met:  10/13/14 Microdiscectomy

## 2014-10-13 NOTE — Brief Op Note (Signed)
10/13/2014  12:33 PM  PATIENT:  [name]  34 y.o. female  PRE-OPERATIVE DIAGNOSIS:  LEFT L4-L5 HNP  POST-OPERATIVE DIAGNOSIS:  LEFT L4-L5 HNP  PROCEDURE:  Procedure(s): LEFT L4-L5 DISCECTOMY    (1 LEVEL) (Left)  SURGEON:  Surgeon(s) and Role:    * Melina Schools, MD - Primary  PHYSICIAN ASSISTANT:   ASSISTANTS: none   ANESTHESIA:   general  EBL:     BLOOD ADMINISTERED:none  DRAINS: none   LOCAL MEDICATIONS USED:  MARCAINE     SPECIMEN:  No Specimen  DISPOSITION OF SPECIMEN:  N/A  COUNTS:  YES  TOURNIQUET:  * No tourniquets in log *  DICTATION: .Other Dictation: Dictation Number [id number]  PLAN OF CARE: Admit for overnight observation  PATIENT DISPOSITION:  PACU - hemodynamically stable.

## 2014-10-13 NOTE — Progress Notes (Signed)
report given to [facility] rn as caregiver

## 2014-10-13 NOTE — Anesthesia Postprocedure Evaluation (Signed)
Anesthesia Post Note  Patient: Stacy Deleon  Procedure(s) Performed: Procedure(s) (LRB): LEFT L4-L5 DISCECTOMY    (1 LEVEL) (Left)  Anesthesia type: general  Patient location: PACU  Post pain: Pain level controlled  Post assessment: Patient's Cardiovascular Status Stable  Last Vitals:  Filed Vitals:   10/13/14 1442  BP: 136/74  Pulse: 95  Temp: 36.6 C  Resp: 16    Post vital signs: Reviewed and stable  Level of consciousness: sedated  Complications: No apparent anesthesia complications

## 2014-10-13 NOTE — Transfer of Care (Signed)
Immediate Anesthesia Transfer of Care Note  Patient: Stacy Deleon  Procedure(s) Performed: Procedure(s): LEFT L4-L5 DISCECTOMY    (1 LEVEL) (Left)  Patient Location: PACU  Anesthesia Type:General  Level of Consciousness: awake and alert   Airway & Oxygen Therapy: Patient Spontanous Breathing and Patient connected to nasal cannula oxygen  Post-op Assessment: Report given to RN and Post -op Vital signs reviewed and stable  Post vital signs: Reviewed and stable  Last Vitals:  Filed Vitals:   10/13/14 1250  BP: 110/68  Pulse: 117  Temp: 36.6 C  Resp: 18    Complications: No apparent anesthesia complications

## 2014-10-13 NOTE — Anesthesia Preprocedure Evaluation (Signed)
Anesthesia Evaluation  Patient identified by MRN, date of birth, ID band Patient awake    Reviewed: Allergy & Precautions, NPO status , Patient's Chart, lab work & pertinent test results  History of Anesthesia Complications (+) PONV  Airway Mallampati: I  TM Distance: >3 FB Neck ROM: Full    Dental   Pulmonary former smoker,          Cardiovascular     Neuro/Psych Anxiety Depression    GI/Hepatic GERD-  Medicated and Controlled,  Endo/Other    Renal/GU      Musculoskeletal   Abdominal   Peds  Hematology   Anesthesia Other Findings   Reproductive/Obstetrics                             Anesthesia Physical Anesthesia Plan  ASA: II  Anesthesia Plan: General   Post-op Pain Management:    Induction: Intravenous  Airway Management Planned: Oral ETT  Additional Equipment:   Intra-op Plan:   Post-operative Plan: Extubation in OR  Informed Consent: I have reviewed the patients History and Physical, chart, labs and discussed the procedure including the risks, benefits and alternatives for the proposed anesthesia with the patient or authorized representative who has indicated his/her understanding and acceptance.     Plan Discussed with: CRNA and Surgeon  Anesthesia Plan Comments:         Anesthesia Quick Evaluation

## 2014-10-13 NOTE — H&P (Signed)
History of Present Illness The patient is a 34 year old female who presents today for follow up of their back. The patient is being followed for their back pain (right low back and posterior right thigh to the calf for about 3 years and getting worse. She has had a recent Lumbar MRI scan at [facility] on 09/17/14). Symptoms reported today include: pain ("all the time"), pain at night (wakes me up), aching, throbbing, stiffness, pain with weightbearing, difficulty ambulating, difficulty arising from chair, weakness (right leg), numbness ("tingling and pins and needles"), urinary incontinence ("It is been happening for 2 weeks "), leg pain, pain with lying ("only comfortable when laying on my side"), pain with lifting, pain with sitting and pain with standing, while the patient does not report symptoms of: giving way or instability. The patient states that they are doing poorly. Current treatment includes: activity modification. The following medication has been used for pain control: antiinflammatory medication and Hydrocodone (5/325 Rx'd by Dr Rolena Infante). The patient reports their current pain level to be 9 / 10 (right now). The patient presents today following MRI and ESI (09/05/2014 [facility] L4-5 RT. felt like she got some good results until the weekend.). The patient has reported improvement of their symptoms with: Cortisone injections ("until the most recent injection"). The patient indicates that they have questions or concerns today regarding activity and work duties (" I cannot work now").  Additional reasons for visit:  H & P is described as the following: The patient is scheduled for a Left L4-5 Discectomy to be performed by Dr. Duane Lope D. Rolena Infante, MD at [facility] on 02//07/2015 . Please see the hospital record for complete dictated history and physical.  Allergies Collie Siad M Toomes; 10/10/2014 1:39 PM) Hydrocodone-Acetaminophen *ANALGESICS - OPIOID* Itching.  Family History  Congestive Heart  Failure grandmother mothers side Heart Disease father and grandmother fathers side Depression First Degree Relatives. mother, father and sister Heart disease in female family member before age 67 Cerebrovascular Accident grandmother mothers side Hypertension father, grandmother mothers side and grandmother fathers side Kidney disease grandmother mothers side Rheumatoid Arthritis father and grandmother mothers side First Degree Relatives reported No pertinent family history  Social History Drug/Alcohol Rehab (Previously) no Illicit drug use no Drug/Alcohol Rehab (Currently) no Pain Contract no Alcohol use current drinker; drinks beer, wine and hard liquor; only occasionally per week Children 3 Current drinker 05/31/2014: Currently drinks beer, wine and hard liquor only occasionally per week Current work status working full time Exercise Exercises weekly; does running / walking Living situation live with spouse Marital status divorced partnered No history of drug/alcohol rehab Not under pain contract Number of flights of stairs before winded 2-3 greater than 5 Tobacco / smoke exposure 05/31/2014: yes outdoors only yes outdoors only Tobacco use Former smoker. 05/31/2014: smoke(d) 1 pack(s) per day former smoker; smoke(d) 1 pack(s) per day  Medication History Norco (5-325MG  Tablet, 1 (one) Tablet Oral four times daily, as needed, Taken starting 09/30/2014) Active. (rx given at [facility] per ddb/smt 09/30/14) Promethazine HCl (12.5MG  Tablet, 1 (one) Tablet Oral two times daily, as needed for nausea, Taken starting 09/27/2014) Active. Vyvanse (20MG  Capsule, Oral) Active. (for ADHD) Ibuprofen (200MG  Capsule, 1 (one) Oral) Active. (prn) Womens Multi Vitamin & Mineral (Oral daily) Active. (qd)  Vitals  10/10/2014 1:33 PM Weight: 211 lb Height: 65.5in Body Surface Area: 2.03 m Body Mass Index: 34.58 kg/m  Temp.: 98.10F  Pulse: 103 (Regular)  BP:  106/77 (Sitting, Left Arm, Standard)  Physical Exam  General Mental Status -Alert and cooperative.  Cardiovascular Cardiovascular examination reveals -on palpation PMI is normal in location and amplitude, no palpable S3 or S4. Normal cardiac borders. and normal pedal pulses bilaterally. Auscultation Rhythm - Regular.  Neurologic Motor Lower Extremity - Left - Motor function is diminished in the lower extremity and EHL weakness present. Sensation Lower Extremity - Left - sensation is diminished to light touch in the lower extremity. Reflexes Babinski - Bilateral - Babinski not present. Clonus - Bilateral - clonus not present. Hoffman's Sign - Bilateral - Hoffman's sign not present. Testing Supine Straight Leg Raise - Left - Supine straight leg raise positive. Seated Straight Leg Raise - Left - Seated straight leg raise positive.  Musculoskeletal Spine/Ribs/Pelvis  Lumbosacral Spine: Assessment of pain reveals the following findings - The pain is characterized as - severe, burning, constant ache, pain increases with sustained postures and shooting. Location - pain is distributed in an anatomic pattern(left L5 distribution) and left foot. Strength and Tone - Testing limited - due to pain.  Assessment & Plan  Current Plans Goal Of Surgery:Discussed that goal of surgery is to reduce pain and improve function and quality of life. Patient is aware that despite all appropriate treatment that there pain and function could be the same, worse, or different. Posterior Lumbar Decompression/disectomy: Risks of surgery include infection, bleeding, nerve damage, death, stroke, paralysis, failure to heal, need for further surgery, ongoing or worse pain, need for further surgery, CSF leak, loss of bowel or bladder, and recurrent disc herniation or Stenosis which would necessitate need for further surgery. Radicular lumbar or thoracic pain (M54.10)

## 2014-10-14 ENCOUNTER — Encounter (HOSPITAL_COMMUNITY): Payer: Self-pay | Admitting: Orthopedic Surgery

## 2014-10-14 DIAGNOSIS — M5116 Intervertebral disc disorders with radiculopathy, lumbar region: Secondary | ICD-10-CM | POA: Diagnosis not present

## 2014-10-14 NOTE — Discharge Summary (Signed)
Patient ID: Stacy Deleon MRN: [id number] DOB/AGE: 1981-07-24 34 y.o.  Admit date: 10/13/2014 Discharge date: 10/14/2014  Admission Diagnoses:  Active Problems:   Lumbar disc herniation   Discharge Diagnoses:  Active Problems:   Lumbar disc herniation  status post Procedure(s): LEFT L4-L5 DISCECTOMY    (1 LEVEL)  Past Medical History  Diagnosis Date  . Bronchitis   . Polycystic ovary syndrome   . Anxiety   . Depression   . ADHD (attention deficit hyperactivity disorder)   . PONV (postoperative nausea and vomiting)   . Incontinence of urine   . GERD (gastroesophageal reflux disease)   . Neuromuscular disorder     MIGRAINES     Surgeries: Procedure(s): LEFT L4-L5 DISCECTOMY    (1 LEVEL) on 10/13/2014   Consultants:    Discharged Condition: Improved  Hospital Course: Stacy Deleon is an 34 y.o. female who was admitted 10/13/2014 for operative treatment of L4/5 disc herniation. Patient failed conservative treatments (please see the history and physical for the specifics) and had severe unremitting pain that affects sleep, daily activities and work/hobbies. After pre-op clearance, the patient was taken to the operating room on 10/13/2014 and underwent  Procedure(s): LEFT L4-L5 DISCECTOMY    (1 LEVEL).    Patient was given perioperative antibiotics: Anti-infectives    Start     Dose/Rate Route Frequency Ordered Stop   10/13/14 1800  ceFAZolin (ANCEF) IVPB 1 g/50 mL premix     1 g 100 mL/hr over 30 Minutes Intravenous Every 8 hours 10/13/14 1449 10/14/14 0215   10/12/14 1414  ceFAZolin (ANCEF) IVPB 2 g/50 mL premix     2 g 100 mL/hr over 30 Minutes Intravenous 30 min pre-op 10/12/14 1414 10/13/14 1031       Patient was given sequential compression devices and early ambulation to prevent DVT.   Patient benefited maximally from hospital stay and there were no complications. At the time of discharge, the patient was urinating/moving their bowels without difficulty,  tolerating a regular diet, pain is controlled with oral pain medications and they have been cleared by PT/OT.   Recent vital signs: Patient Vitals for the past 24 hrs:  BP Temp Temp src Pulse Resp SpO2 Height Weight  10/14/14 0506 119/65 mmHg 98.3 F (36.8 C) Oral 78 18 97 % - -  10/13/14 2349 110/62 mmHg 98.6 F (37 C) Oral 74 20 98 % - -  10/13/14 2006 119/63 mmHg 99 F (37.2 C) Oral 90 20 98 % - -  10/13/14 1621 120/62 mmHg - - 93 16 99 % - -  10/13/14 1442 136/74 mmHg 97.9 F (36.6 C) - 95 16 100 % - -  10/13/14 1415 - 98.2 F (36.8 C) - 91 14 94 % - -  10/13/14 1400 110/64 mmHg - - 81 14 99 % - -  10/13/14 1345 129/73 mmHg - - 90 16 100 % - -  10/13/14 1330 119/64 mmHg - - (!) 110 (!) 23 100 % - -  10/13/14 1315 135/90 mmHg - - (!) 104 17 99 % - -  10/13/14 1300 121/76 mmHg - - 100 19 99 % - -  10/13/14 1250 110/68 mmHg 97.8 F (36.6 C) - (!) 117 18 100 % - -  10/13/14 0846 - - - - - - 5\' 6"  (1.676 m) 102.513 kg (226 lb)  10/13/14 0831 133/69 mmHg 97.9 F (36.6 C) Oral 83 18 96 % - 103.874 kg (229 lb)  Recent laboratory studies:  Recent Labs  10/12/14 1057  WBC 7.5  HGB 13.5  HCT 40.6  PLT 368  NA 136  K 4.6  CL 103  CO2 26  BUN 9  CREATININE 0.84  GLUCOSE 94  CALCIUM 9.5     Discharge Medications:     Medication List    STOP taking these medications        cyclobenzaprine 5 MG tablet  Commonly known as:  FLEXERIL     diazepam 5 MG tablet  Commonly known as:  VALIUM     HYDROcodone-acetaminophen 5-325 MG per tablet  Commonly known as:  NORCO/VICODIN     oxyCODONE-acetaminophen 5-325 MG per tablet  Commonly known as:  PERCOCET/ROXICET  Replaced by:  oxyCODONE-acetaminophen 10-325 MG per tablet     promethazine 12.5 MG tablet  Commonly known as:  PHENERGAN      TAKE these medications        docusate sodium 100 MG capsule  Commonly known as:  COLACE  Take 1 capsule (100 mg total) by mouth 3 (three) times daily as needed for mild  constipation.     lisdexamfetamine 50 MG capsule  Commonly known as:  VYVANSE  Take 50 mg by mouth every morning.     methocarbamol 500 MG tablet  Commonly known as:  ROBAXIN  Take 1 tablet (500 mg total) by mouth 3 (three) times daily as needed for muscle spasms.     omeprazole 20 MG capsule  Commonly known as:  PRILOSEC  Take 20 mg by mouth every morning.     ondansetron 4 MG tablet  Commonly known as:  ZOFRAN  Take 1 tablet (4 mg total) by mouth every 8 (eight) hours as needed for nausea or vomiting.     oxyCODONE-acetaminophen 10-325 MG per tablet  Commonly known as:  PERCOCET  Take 1 tablet by mouth every 4 (four) hours as needed for pain.        Diagnostic Studies: Dg Lumbar Spine 1 View  Nov 12, 2014   CLINICAL DATA:  34 year old female undergoing lumbar surgery. Initial encounter.  EXAM: DG C-ARM 61-120 MIN; LUMBAR SPINE - 1 VIEW  TECHNIQUE: Single intraoperative cross-table lateral fluoroscopic view of the lower lumbar spine.  FLUOROSCOPY TIME:  Radiation Exposure Index (as provided by the fluoroscopic device):  If the device does not provide the exposure index:  Fluoroscopy Time (in minutes and seconds):  0 minutes 8 seconds  Number of Acquired Images:  0  COMPARISON:  [facility] lumbar spine CT discogram 12/27/2013. [facility] lumbar MRI 11/30/2013.  FINDINGS: Same numbering system as on 12/27/2013. A Surgical probe is in place at the posterior aspect of the L4-L5 disc.  IMPRESSION: Intraoperative localization at L4-L5.   Electronically Signed   By: Odessa Fleming M.D.   On: 11-12-2014 12:55   Dg C-arm 1-60 Min  11-12-14   CLINICAL DATA:  34 year old female undergoing lumbar surgery. Initial encounter.  EXAM: DG C-ARM 61-120 MIN; LUMBAR SPINE - 1 VIEW  TECHNIQUE: Single intraoperative cross-table lateral fluoroscopic view of the lower lumbar spine.  FLUOROSCOPY TIME:  Radiation Exposure Index (as provided by the fluoroscopic device):  If the device does not  provide the exposure index:  Fluoroscopy Time (in minutes and seconds):  0 minutes 8 seconds  Number of Acquired Images:  0  COMPARISON:  [facility] lumbar spine CT discogram 12/27/2013. [facility] lumbar MRI 11/30/2013.  FINDINGS: Same numbering system as on 12/27/2013. A Surgical probe is  in place at the posterior aspect of the L4-L5 disc.  IMPRESSION: Intraoperative localization at L4-L5.   Electronically Signed   By: Odessa Fleming M.D.   On: 10/13/2014 12:55          Follow-up Information    Follow up with Alvy Beal, MD. Schedule an appointment as soon as possible for a visit in 2 weeks.   Specialty:  Orthopedic Surgery   Why:  For suture removal, For wound re-check   Contact information:   [address] [place] [address] [phone]       Discharge Plan:  discharge to home Disposition: radicular leg pain resolved.  NVI.  F/u in 2 weeks/    Signed: Venita Lick D for Dr. Venita Lick [facility] [phone] 10/14/2014, 8:07 AM

## 2014-10-14 NOTE — Op Note (Signed)
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  OPERATIVE PROCEDURE:  L4-5 laminectomy, laminotomy for diskectomy at [address] CPT code.  HISTORY:  This is a very pleasant woman who has been having chronic debilitating back, buttock, and left leg pain.  The patient states her symptoms got worse despite injection therapy, physical therapy.  As a result, we elected to proceed with surgery.  All appropriate risks, benefits, and alternatives were discussed with the patient and consent was obtained.  OPERATIVE NOTE:  The patient was brought to the operating room, placed supine on the operating table.  After successful induction of general anesthesia and endotracheal intubation, TEDs, SCDs were applied.  She was turned prone onto the Wilson frame and all bony prominences were well padded.  The back was prepped and draped in a standard fashion. Time-out was taken to confirm the patient, procedure, and all other pertinent important data.  Once this was done, 2 needles were placed in the back.  A lateral fluoro view was taken confirming the L4-5 space. The incision site was marked out and infiltrated with 0.25% Marcaine with epi.  A midline incision was made centered over the L4-5 disk space.  Sharp dissection was carried out down to the adipose tissue to the deep fascia.  The deep fascia was sharply incised and stripped off the paraspinal muscles to expose the L4 and L5 lamina and spinous process.  I then placed a Penfield 4 underneath the L4 lamina.  I confirmed that  I was at the appropriate level.  Once this was done, because of the patient's obesity I elected to use a Thompson self- retaining retractor system.  I then assembled this with a 90-mm blade and had excellent traction and visualization.  Using a 3-mm Kerrison, I then performed a laminotomy of L4.  I then used a 304 angled Karlin curette to dissect through the ligamentum flavum.  Once I was through, I used a 2-mm Kerrison to resect the ligamentum flavum and exposed the underlying thecal sac.  I then dissected out laterally until I released some of the osteophyte from the medial aspect of the L4 inferior facet complex.  I could now visualize the L5 nerve root.  I then mobilized the L5 nerve root.  I visualized the fragmented disk protruding into the surgical field.  I placed neural patties inferiorly along to retract the L5 nerve root superiorly.  This allowed me to visualize the posterior aspect of the disk.  Using a nerve hook, I was able to liberate the disk fragment that was exposed.  I then performed a laminotomy with a 15 blade scalpel and then removed the remaining fragments of disk material. This was accomplished using nerve hooks, micro pituitary rongeurs.  Once I had  all the disk material, I removed the neural patties, and I was able to use a [facility] elevator palpating freely along the L5 nerve root towards its foramen in the lateral recess.  I was completely free of any tension.  I then swept circumferentially along the disk space from an inferior to superior level and again there was no central compression. This portion of the disk fragment was removed.  I then went superiorly and again there was no neural compression or any fragmented disk material.  At this point, I was pleased with the diskectomy.  Using a bipolar electrocautery, I obtained hemostasis, coagulated the epidural veins, and then I placed some FloSeal into the wound.  I then irrigated out the wound copiously with  normal saline, and I confirmed there was hemostasis.  I then placed a large thrombin Gelfoam soaked patty over the laminotomy site, closed the deep fascia with interrupted #1 Vicryl sutures, then I closed the superficial 2-0 Vicryl sutures and a 3- 0 Monocryl for the skin.  Steri-Strips and a dry dressing were applied. The patient was ultimately extubated and transferred to PACU without incident.  At the end of the case, all needle and sponge counts were correct.  There were no adverse intraoperative events.     Alvy Beal, MD     DDB/MEDQ  D:  10/13/2014  T:  10/14/2014  Job:  [id number]

## 2014-10-14 NOTE — Evaluation (Signed)
Physical Therapy Evaluation Patient Details Name: Stacy Deleon MRN: [id number] DOB: 06/03/1981 Today's Date: 10/14/2014   History of Present Illness  LEFT L4-L5 DISCECTOMY  Clinical Impression  Stacy Deleon is pleasant and moving well. Educated for all precautions with handout given, practice for all mobility, stairs and gait and cues for cups with brushing teeth. Pt will benefit from acute therapy to maximize mobility, function and safety to adhere to precautions prior to D/C to decrease burden of care.     Follow Up Recommendations No PT follow up    Equipment Recommendations  None recommended by PT    Recommendations for Other Services       Precautions / Restrictions Precautions Precautions: Back Precaution Booklet Issued: Yes (comment) Required Braces or Orthoses: Spinal Brace Spinal Brace: Lumbar corset;Applied in sitting position (brace not in room and Dr.Brooks confirmed ok for mobility prior to brace arrival)      Mobility  Bed Mobility Overal bed mobility: Needs Assistance Bed Mobility: Rolling;Sidelying to Sit Rolling: Supervision Sidelying to sit: Supervision       General bed mobility comments: cues for sequence and precautions  Transfers Overall transfer level: Needs assistance   Transfers: Sit to/from Stand Sit to Stand: Supervision         General transfer comment: cues for posture  Ambulation/Gait Ambulation/Gait assistance: Modified independent (Device/Increase time) Ambulation Distance (Feet): 800 Feet Assistive device: None Gait Pattern/deviations: Step-through pattern;Decreased stride length   Gait velocity interpretation: Below normal speed for age/gender    Stairs Stairs: Yes Stairs assistance: Modified independent (Device/Increase time) Stair Management: One rail Right;Step to pattern;Forwards Number of Stairs: 3    Wheelchair Mobility    Modified Rankin (Stroke Patients Only)       Balance                                              Pertinent Vitals/Pain Pain Assessment: 0-10 Pain Score: 7  Pain Location: left hip at rest and with mobility Pain Descriptors / Indicators: Aching Pain Intervention(s): Repositioned;Premedicated before session    Home Living Family/patient expects to be discharged to:: Private residence Living Arrangements: Children;Other relatives Available Help at Discharge: Family;Available 24 hours/day Type of Home: House Home Access: Stairs to enter;Ramped entrance Entrance Stairs-Rails: Right;Left Entrance Stairs-Number of Steps: 3 Home Layout: One level Home Equipment: Walker - 2 wheels;Cane - single point;Bedside commode;Wheelchair - Lawyer Comments: all equipment belongs to family but available for use    Prior Function Level of Independence: Independent               Hand Dominance        Extremity/Trunk Assessment   Upper Extremity Assessment: Overall WFL for tasks assessed           Lower Extremity Assessment: Generalized weakness      Cervical / Trunk Assessment: Normal  Communication   Communication: No difficulties  Cognition Arousal/Alertness: Awake/alert Behavior During Therapy: WFL for tasks assessed/performed Overall Cognitive Status: Within Functional Limits for tasks assessed                      General Comments      Exercises        Assessment/Plan    PT Assessment Patient needs continued PT services  PT Diagnosis Difficulty walking;Acute pain   PT Problem List Decreased activity tolerance;Decreased knowledge  of precautions;Pain  PT Treatment Interventions Gait training;Functional mobility training;Therapeutic activities;Patient/family education   PT Goals (Current goals can be found in the Care Plan section) Acute Rehab PT Goals Patient Stated Goal: return home to kids PT Goal Formulation: With patient Time For Goal Achievement: 10/28/14 Potential to Achieve Goals: Good     Frequency Min 5X/week   Barriers to discharge        Co-evaluation               End of Session   Activity Tolerance: Patient tolerated treatment well Patient left: in chair;with call bell/phone within reach;with family/visitor present Nurse Communication: Mobility status;Precautions    Functional Assessment Tool Used: clinical judgement Functional Limitation: Mobility: Walking and moving around Mobility: Walking and Moving Around Current Status (R6045): At least 1 percent but less than 20 percent impaired, limited or restricted Mobility: Walking and Moving Around Goal Status [phone]): At least 1 percent but less than 20 percent impaired, limited or restricted    Time: 1914-7829 PT Time Calculation (min) (ACUTE ONLY): 31 min   Charges:   PT Evaluation $Initial PT Evaluation Tier I: 1 Procedure PT Treatments $Therapeutic Activity: 8-22 mins   PT G Codes:   PT G-Codes **NOT FOR INPATIENT CLASS** Functional Assessment Tool Used: clinical judgement Functional Limitation: Mobility: Walking and moving around Mobility: Walking and Moving Around Current Status (F6213): At least 1 percent but less than 20 percent impaired, limited or restricted Mobility: Walking and Moving Around Goal Status [phone]): At least 1 percent but less than 20 percent impaired, limited or restricted    Delorse Lek 10/14/2014, 8:41 AM Delaney Meigs, PT [phone]

## 2014-10-14 NOTE — Progress Notes (Signed)
Patient alert and oriented, mae's well, voiding adequate amount of urine, swallowing without difficulty, c/o moderate pain and meds given prior to discharged. Patient discharged home with family. Script and discharged instructions given to patient. Patient and family stated understanding of instructions given. Aisha Davion Meara RN  

## 2014-10-14 NOTE — Progress Notes (Signed)
    Subjective: Procedure(s) (LRB): LEFT L4-L5 DISCECTOMY    (1 LEVEL) (Left) 1 Day Post-Op  Patient reports pain as 2 on 0-10 scale.  Reports decreased leg pain reports incisional back pain   Positive void Negative bowel movement Positive flatus Negative chest pain or shortness of breath  Objective: Vital signs in last 24 hours: Temp:  [97.8 F (36.6 C)-99 F (37.2 C)] 98.3 F (36.8 C) (02/12 0506) Pulse Rate:  [74-117] 78 (02/12 0506) Resp:  [14-23] 18 (02/12 0506) BP: (110-136)/(62-90) 119/65 mmHg (02/12 0506) SpO2:  [94 %-100 %] 97 % (02/12 0506) Weight:  [102.513 kg (226 lb)-103.874 kg (229 lb)] 102.513 kg (226 lb) (02/11 0846)  Intake/Output from previous day: 02/11 0701 - 02/12 0700 In: 2320 [P.O.:720; I.V.:1600] Out: 10 [Blood:10]  Labs:  Recent Labs  10/12/14 1057  WBC 7.5  RBC 4.77  HCT 40.6  PLT 368    Recent Labs  10/12/14 1057  NA 136  K 4.6  CL 103  CO2 26  BUN 9  CREATININE 0.84  GLUCOSE 94  CALCIUM 9.5   No results for input(s): LABPT, INR in the last 72 hours.  Physical Exam: Neurologically intact ABD soft Neurovascular intact Intact pulses distally Dorsiflexion/Plantar flexion intact Incision: dressing C/D/I and no drainage Compartment soft  Assessment/Plan: Patient stable  xrays n/a Continue mobilization with physical therapy Continue care  Advance diet Up with therapy D/C IV fluids  Plan for d/c to home today  Melina Schools, MD [facility] [phone]

## 2014-10-14 NOTE — Evaluation (Signed)
Occupational Therapy Evaluation and Discharge Summary Patient Details Name: Stacy Deleon MRN: [id number] DOB: Apr 15, 1981 Today's Date: 10/14/2014    History of Present Illness LEFT L4-L5 DISCECTOMY   Clinical Impression   Pt admitted with the above diagnosis and overall is doing well with adls.  Pt has assist at home after d/c.  All education re: proper body techniques during adls was reviewed and pt has not questions.  Fiance to get adaptive equipment at gift store before d/c. No further acute OT needs.    Follow Up Recommendations  No OT follow up;Supervision - Intermittent    Equipment Recommendations  None recommended by OT    Recommendations for Other Services       Precautions / Restrictions Precautions Precautions: Back Precaution Booklet Issued: Yes (comment) Precaution Comments: reviewed back precautions on several occasions Required Braces or Orthoses: Spinal Brace Spinal Brace: Lumbar corset;Applied in sitting position;Other (comment) (Not available but MD ok with proceeding w/o it) Restrictions Weight Bearing Restrictions: No      Mobility Bed Mobility Overal bed mobility: Needs Assistance Bed Mobility: Rolling;Sidelying to Sit Rolling: Supervision Sidelying to sit: Supervision       General bed mobility comments: cues for sequence and precautions  Transfers Overall transfer level: Needs assistance Equipment used: 1 person hand held assist Transfers: Sit to/from Stand Sit to Stand: Supervision         General transfer comment: cues for posture    Balance Overall balance assessment: No apparent balance deficits (not formally assessed)                                          ADL Overall ADL's : Needs assistance/impaired Eating/Feeding: Independent   Grooming: Supervision/safety;Standing Grooming Details (indicate cue type and reason): educated pt on not leaning over sink but using cups to assist with brushing teeth  etc. Upper Body Bathing: Set up;Sitting   Lower Body Bathing: Minimal assistance;Cueing for back precautions;Sit to/from stand Lower Body Bathing Details (indicate cue type and reason): min assist to stand and reach bottom and between legs.  Pt can cross legs to bathe feet w/o assist. Upper Body Dressing : Set up;Sitting Upper Body Dressing Details (indicate cue type and reason): pt will need to be educated on donning corsett when it arrives. Lower Body Dressing: Minimal assistance;Sit to/from stand Lower Body Dressing Details (indicate cue type and reason): min assist given for donning socks and shoes. Spoke with pt about adaptive equipment pack in gift store.  Pt's signifcant other may go purchase one.  Reviewed what is in this pack. Toilet Transfer: Supervision/safety;Comfort height toilet;Grab bars Toilet Transfer Details (indicate cue type and reason): cues for hand placement Toileting- Clothing Manipulation and Hygiene: Minimal assistance;Sit to/from stand;Cueing for back precautions Toileting - Clothing Manipulation Details (indicate cue type and reason): cues provided to avoid twisting while on toilet. Tub/ Shower Transfer: Tub transfer;Minimal assistance;Adhering to back precautions;Ambulation Tub/Shower Transfer Details (indicate cue type and reason): cues for different techniques to try at home to avoid pain. Functional mobility during ADLs: Supervision/safety General ADL Comments: Pt did well with adls. Pt very focused on her pain.  Back precautions reviewed several times in relation to adls.     Vision     Perception     Praxis      Pertinent Vitals/Pain Pain Assessment: 0-10 Pain Score: 7  Pain Location: L hip and leg Pain  Descriptors / Indicators: Aching Pain Intervention(s): Monitored during session;Repositioned     Hand Dominance Right   Extremity/Trunk Assessment Upper Extremity Assessment Upper Extremity Assessment: Overall WFL for tasks assessed   Lower  Extremity Assessment Lower Extremity Assessment: Defer to PT evaluation   Cervical / Trunk Assessment Cervical / Trunk Assessment: Normal   Communication Communication Communication: No difficulties   Cognition Arousal/Alertness: Awake/alert Behavior During Therapy: WFL for tasks assessed/performed Overall Cognitive Status: Within Functional Limits for tasks assessed                     General Comments       Exercises       Shoulder Instructions      Home Living Family/patient expects to be discharged to:: Private residence Living Arrangements: Children;Other relatives Available Help at Discharge: Family;Available 24 hours/day Type of Home: House Home Access: Stairs to enter;Ramped entrance Entrance Stairs-Number of Steps: 3 Entrance Stairs-Rails: Right;Left Home Layout: One level     Bathroom Shower/Tub: Tub/shower unit;Curtain Shower/tub characteristics: Curtain Firefighter: Handicapped height     Home Equipment: Environmental consultant - 2 wheels;Cane - single point;Bedside commode;Wheelchair - Sport and exercise psychologist Comments: all equipment belongs to family but available for use      Prior Functioning/Environment Level of Independence: Independent        Comments: pt works for [employer]    OT Diagnosis:     OT Problem List:     OT Treatment/Interventions:      OT Goals(Current goals can be found in the care plan section) Acute Rehab OT Goals Patient Stated Goal: return home to kids OT Goal Formulation: All assessment and education complete, DC therapy  OT Frequency:     Barriers to D/C:            Co-evaluation              End of Session Nurse Communication: Mobility status  Activity Tolerance: Patient tolerated treatment well Patient left: in bed;with call bell/phone within reach;with family/visitor present   Time: 1610-9604 OT Time Calculation (min): 25 min Charges:  OT General Charges $OT Visit: 1 Procedure OT  Evaluation $Initial OT Evaluation Tier I: 1 Procedure G-Codes: OT G-codes **NOT FOR INPATIENT CLASS** Functional Assessment Tool Used: clinical judgement Functional Limitation: Self care Self Care Current Status (V4098): At least 1 percent but less than 20 percent impaired, limited or restricted Self Care Goal Status (J1914): At least 1 percent but less than 20 percent impaired, limited or restricted Self Care Discharge Status [phone]): At least 1 percent but less than 20 percent impaired, limited or restricted  Hope Budds 10/14/2014, 10:46 AM  [phone]

## 2014-10-14 NOTE — Progress Notes (Signed)
UR completed 

## 2014-10-14 NOTE — Progress Notes (Signed)
Orthopedic Tech Progress Note Patient Details:  Stacy Deleon 04-15-1981 [id number]  Patient ID: [name], female   DOB: [birth year], 34 y.o.   MRN: [id number] Called in bio-tech brace order; spoke with Raul Del, Omah Dewalt 10/14/2014, 9:18 AM

## 2014-12-07 ENCOUNTER — Encounter: Payer: Self-pay | Admitting: Podiatry

## 2014-12-07 ENCOUNTER — Ambulatory Visit (INDEPENDENT_AMBULATORY_CARE_PROVIDER_SITE_OTHER): Payer: Medicaid Other | Admitting: Podiatry

## 2014-12-07 VITALS — BP 117/74 | HR 79 | Resp 18

## 2014-12-07 DIAGNOSIS — D492 Neoplasm of unspecified behavior of bone, soft tissue, and skin: Secondary | ICD-10-CM | POA: Diagnosis not present

## 2014-12-07 DIAGNOSIS — Q828 Other specified congenital malformations of skin: Secondary | ICD-10-CM | POA: Diagnosis not present

## 2014-12-07 NOTE — Progress Notes (Signed)
Subjective:    Patient ID: Stacy Deleon, female    DOB: 04/23/1981, 34 y.o.   MRN: [id number]  HPI  34 year old female presents the office they with combined with painful lesion on the ball of her right foot which is been ongoing since November 2015. She states in January she take out the area out herself and she states that a core came out. She is also attempted over-the-counter corn remover pads without any resolution. She states that she has pain to the area particularly when going barefoot. She denies any redness or drainage from around the site. No other complaints at this time.  Review of Systems  Musculoskeletal: Positive for back pain.       Back surgery 10-13-14  All other systems reviewed and are negative.      Objective:   Physical Exam AAO 3, NAD  DP/PT pulses palpable, CRT less than 3 seconds  Protective sensation intact with Simms weinstein monofilament, vibratory sensation intact, Achilles tendon reflex intact. Left submetatarsal 2 hyperkeratotic lesion.  There is mild tenderness upon direct pressure to this lesion.Upon debridement there is a small medical pinpoint bleeding which resembles verruca, although it appears deep. there is no swelling erythema, edema, drainage, or other clinical signs of infection. No other lesions identified bilaterally. No other areas of tenderness to bilateral lower extremity's. No overlying edema, erythema, increased warmth. MMT 5/5, ROM WNL No open lesions or pre-ulcerative lesions  No pain with calf compression, swelling, warmth, erythema.      Assessment & Plan:   34 year old female left metatarsal to porokeratosis versus verruca  -Treatment options were discussed include alternatives, risks, complications  -Lesion sharply debrided without complications.  -A pad was placed around the lesion followed by salinocaine and a bandage. Post procedure instructions were discussed the patient which understood. Monitoring signs or symptoms of  infection and directed to call the office immediately if any are to occur. Dispensed offloading pads.  -Follow-up in 3 weeks or sooner if any problems are to arise. In the meantime encouraged to call the office with any questions, concerns, change in symptoms.

## 2014-12-12 ENCOUNTER — Ambulatory Visit: Payer: Medicaid Other | Attending: Orthopedic Surgery | Admitting: Physical Therapy

## 2014-12-12 ENCOUNTER — Encounter: Payer: Self-pay | Admitting: Physical Therapy

## 2014-12-12 DIAGNOSIS — Z4789 Encounter for other orthopedic aftercare: Secondary | ICD-10-CM | POA: Diagnosis present

## 2014-12-12 DIAGNOSIS — M5136 Other intervertebral disc degeneration, lumbar region: Secondary | ICD-10-CM | POA: Insufficient documentation

## 2014-12-12 DIAGNOSIS — E282 Polycystic ovarian syndrome: Secondary | ICD-10-CM | POA: Insufficient documentation

## 2014-12-12 DIAGNOSIS — M545 Low back pain, unspecified: Secondary | ICD-10-CM

## 2014-12-12 NOTE — Therapy (Signed)
[facility] [address] [place], [place], [address] Phone: [phone]   Fax:  [phone]  Physical Therapy Evaluation  Patient Details  Name: Stacy Deleon MRN: [id number] Date of Birth: [birth year] Referring Provider:  Venita Lick, MD  Encounter Date: 12/12/2014      PT End of Session - 12/12/14 0945    Visit Number 1   Number of Visits 3  1 eval   Date for PT Re-Evaluation 12/19/14  then 2x/week, then 1 time per week   PT Start Time 0930   PT Stop Time 1000   PT Time Calculation (min) 30 min   Activity Tolerance Patient tolerated treatment well   Behavior During Therapy [facility] for tasks assessed/performed      Past Medical History  Diagnosis Date  . Bronchitis   . Polycystic ovary syndrome   . Anxiety   . Depression   . ADHD (attention deficit hyperactivity disorder)   . PONV (postoperative nausea and vomiting)   . Incontinence of urine   . GERD (gastroesophageal reflux disease)   . Neuromuscular disorder     MIGRAINES     Past Surgical History  Procedure Laterality Date  . Tubal ligation    . Hernia repair    . Plantar fascia surgery    . Lumbar laminectomy/decompression microdiscectomy Left 10/13/2014    Procedure: LEFT L4-L5 DISCECTOMY    (1 LEVEL);  Surgeon: Venita Lick, MD;  Location: [facility] OR;  Service: Orthopedics;  Laterality: Left;    There were no vitals filed for this visit.  Visit Diagnosis:  Right-sided low back pain without sciatica      Subjective Assessment - 12/12/14 0928    Subjective Patient reports S/P lumbar discectomy 10/13/2014 at L4-L5. Patient reports she should have therapy to get back into physical activity.  Patient was on bedrest since mid January.    Limitations Sitting;Standing;Lifting;House hold activities  No lifting over 10 pounds, no overhead lift   How long can you sit comfortably? 30-45 min.    How long can you stand comfortably? 20 min.   How long  can you walk comfortably? 15 min.    Patient Stated Goals reintroduce herself to motion.  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    Currently in Pain? Yes   Pain Score 6    Pain Location Back   Pain Orientation Right   Pain Descriptors / Indicators Constant;Dull;Aching;Shooting   Pain Type Chronic pain   Pain Onset More than a month ago   Pain Frequency Constant   Aggravating Factors  when go further than her limitations   Pain Relieving Factors lay on right side   Effect of Pain on Daily Activities unable to work at this time.  Return to work after second therapy visit.    Multiple Pain Sites No            [facility] PT Assessment - 12/12/14 0001    Assessment   Medical Diagnosis S/P lumbar discectomy    Onset Date 10/13/14   Next MD Visit 01/17/2015   Prior Therapy None   Precautions   Precautions Back   Precaution Comments No lifting more than 20 pounds, no lifting overhead   Balance Screen   Has the patient fallen in the past 6 months No   Has the patient had a decrease in activity level because of a fear of falling?  No   Is the patient  reluctant to leave their home because of a fear of falling?  No   Home Environment   Living Enviornment Private residence   Prior Function   Level of Independence Independent with basic ADLs   Vocation --  start part-time then 2  weeks later wll be full-time   Vocation Requirements standing, walking, constant activity, bending, lifting   Observation/Other Assessments   Focus on Therapeutic Outcomes (FOTO)  68% limitation   AROM   Lumbar Extension decreased by 25% with pulling   Lumbar - Right Side Bend full but guarded   Lumbar - Left Side Bend full but guarded   Strength   Overall Strength Comments Bilateral hip strenggth is 4/5, abdominal strength is 3/5   Flexibility   Hamstrings tight   Piriformis tight   Obturator Internus tight   Ambulation/Gait   Stairs Yes   Stairs Assistance 7:  Independent   Number of Stairs 4   Ramp 7: Independent   High Level Balance   High Level Balance Activites Other (comment)   High Level Balance Comments stand on one leg wit hincreased trunk movement                           PT Education - 12/12/14 0944    Education provided No          PT Short Term Goals - 12/12/14 0946    PT SHORT TERM GOAL #1   Title STG = LTG   Time 3   Period Weeks   Status New           PT Long Term Goals - 12/12/14 0947    PT LONG TERM GOAL #1   Title understand proper body mechanics with household chores, work activities, and daily activities   Time 3   Period Weeks   Status New   PT LONG TERM GOAL #2   Title understand a home exercise program and how to progress appropriately   Time 3   Period Weeks   Status New               Plan - 12/12/14 0950    Clinical Impression Statement Patient is status post lumbar discectomy on 10/13/2014.  Patient has a weak core and weak hip strength.  Patient will return to work end of next week but needs to know correct body mechanics so she does not re-injur her back.    Pt will benefit from skilled therapeutic intervention in order to improve on the following deficits Decreased endurance;Decreased activity tolerance;Decreased mobility;Decreased strength;Pain;Increased muscle spasms   Rehab Potential Excellent   PT Frequency 2x / week  then 1 time following week + initial eval   PT Duration 2 weeks   PT Treatment/Interventions Moist Heat;Therapeutic activities;Patient/family education;Therapeutic exercise;Ultrasound;Manual techniques;Neuromuscular re-education;Cryotherapy;Electrical Stimulation;Functional mobility training   PT Next Visit Plan flexibility exercises including butterfly stretch, hamstring, piriformis.  Abdominal exercises, body mechanics   PT Home Exercise Plan flexibility and body mechanics   Recommended Other Services None   Consulted and Agree with Plan of Care  Patient         Problem List Patient Active Problem List   Diagnosis Date Noted  . 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 ADD (attention deficit disorder) 05/24/2014  . DDD (degenerative disc disease), lumbar 05/24/2014  . Routine general medical examination at a health care facility 05/24/2014  . Affective disorder 05/24/2014  . Depression, neurotic 04/06/2012  . Personal history of  tobacco use, presenting hazards to health 04/06/2012  . Bilateral polycystic ovarian syndrome 03/31/2012  . Adiposity 09/05/2011    Hamish Banks,PT 12/12/2014, 9:54 AM  [facility] [address] [place], [place], [address] Phone: [phone]   Fax:  [phone]

## 2014-12-19 ENCOUNTER — Ambulatory Visit: Payer: Medicaid Other | Admitting: Physical Therapy

## 2014-12-20 ENCOUNTER — Ambulatory Visit (INDEPENDENT_AMBULATORY_CARE_PROVIDER_SITE_OTHER): Payer: Medicaid Other | Admitting: Podiatry

## 2014-12-20 ENCOUNTER — Ambulatory Visit (INDEPENDENT_AMBULATORY_CARE_PROVIDER_SITE_OTHER): Payer: Medicaid Other

## 2014-12-20 ENCOUNTER — Encounter: Payer: Self-pay | Admitting: Podiatry

## 2014-12-20 VITALS — BP 117/80 | HR 84 | Resp 11

## 2014-12-20 DIAGNOSIS — R52 Pain, unspecified: Secondary | ICD-10-CM

## 2014-12-20 DIAGNOSIS — D492 Neoplasm of unspecified behavior of bone, soft tissue, and skin: Secondary | ICD-10-CM

## 2014-12-21 ENCOUNTER — Encounter: Payer: Medicaid Other | Admitting: Physical Therapy

## 2014-12-21 ENCOUNTER — Ambulatory Visit: Payer: Medicaid Other | Admitting: Podiatry

## 2014-12-21 NOTE — Progress Notes (Signed)
Patient ID: Stacy Deleon, female   DOB: Jan 04, 1981, 34 y.o.   MRN: [id number]  Subjective: Patient returns the office today with cleansing pain underlying left foot submetatarsal 2 area. She states that last appointment she noticed that the calluses started to come out it is causing discomfort, particularly with ambulation and walking. She denies any redness or drainage from the site. She states her toes are starting to hurt as well. Denies any systemic complaints such as fevers, chills, nausea, vomiting. No acute changes since last appointment, and no other complaints at this time.   Objective: AAO x3, NAD DP/PT pulses palpable bilaterally, CRT less than 3 seconds Protective sensation intact with Simms Weinstein monofilament, vibratory sensation intact, Achilles tendon reflex intact Left foot second metatarsal 2 hyperkeratotic lesion. Upon debridement lesion there is evidence of verruca at this time. The skin around the area is macerated. There is no surrounding erythema, edema, drainage/purulence, malodor, or other clinical signs of infection. There is no tenderness to palpation upon the toes. Subjectively she states of the toes are painful only with weightbearing. No areas of pinpoint bony tenderness or pain with vibratory sensation. MMT 5/5, ROM WNL. No edema, erythema, increase in warmth to bilateral lower extremities.  No open lesions or pre-ulcerative lesions.  No pain with calf compression, swelling, warmth, erythema  Assessment: 34 year old female left submetatarsal to likely verruca  Plan: -All treatment options discussed with the patient including all alternatives, risks, complications.  -X-rays were obtained and reviewed the patient -Lesion sharply debrided without complications to bleeding. At this time given macerated tissue recommend holding off on any further treatment at this time to the skin heals. -Follow-up in 2 weeks for likely canthrone application.  -Patient encouraged  to call the office with any questions, concerns, change in symptoms.

## 2014-12-26 ENCOUNTER — Ambulatory Visit: Payer: Medicaid Other | Admitting: Physical Therapy

## 2014-12-26 NOTE — Therapy (Signed)
[facility] [address] [place], [place], [address] Phone: [phone]   Fax:  [phone]  Patient Details  Name: Stacy Deleon MRN: [id number] Date of Birth: [birth year] Referring Provider:  Melina Schools, MD  PHYSICAL THERAPY DISCHARGE SUMMARY  Visits from Start of Care: 1  Current functional level related to goals / functional outcomes: See initial evaluation. Patient was not approved for further physical therapy after the initial evaluation.  Patient chose to not continue due to finances.    Remaining deficits: See initial evaluation   Education / Equipment: HEP Plan: Patient agrees to discharge.  Patient goals were not met. Patient is being discharged due to financial reasons.  Thank you for the referral. Earlie Counts, PT 12/26/2014 4:30 PM ?????      Milledge Gerding,PT 12/26/2014, 4:28 PM  [facility] [address] [place], [place], [address] Phone: [phone]   Fax:  [phone]

## 2014-12-28 ENCOUNTER — Ambulatory Visit: Payer: Medicaid Other | Admitting: Podiatry

## 2014-12-28 ENCOUNTER — Encounter: Payer: Medicaid Other | Admitting: Physical Therapy

## 2015-01-02 ENCOUNTER — Encounter: Payer: Medicaid Other | Admitting: Physical Therapy

## 2015-01-04 ENCOUNTER — Ambulatory Visit (INDEPENDENT_AMBULATORY_CARE_PROVIDER_SITE_OTHER): Payer: Medicaid Other | Admitting: Podiatry

## 2015-01-04 ENCOUNTER — Encounter: Payer: Self-pay | Admitting: Podiatry

## 2015-01-04 ENCOUNTER — Encounter: Payer: Medicaid Other | Admitting: Physical Therapy

## 2015-01-04 VITALS — BP 106/67 | HR 91 | Resp 18

## 2015-01-04 DIAGNOSIS — D492 Neoplasm of unspecified behavior of bone, soft tissue, and skin: Secondary | ICD-10-CM | POA: Diagnosis not present

## 2015-01-04 NOTE — Patient Instructions (Signed)
Take dressing off in 8 hours and wash the foot with soap and water. If it is hurting or becomes uncomfortable before the 8 hours, go ahead and remove the bandage and wash the area.  If it blisters, apply antibiotic ointment and a band-aid.  Monitor for any signs/symptoms of infection. Call the office immediately if any occur or go directly to the emergency room. Call with any questions/concerns.   

## 2015-01-04 NOTE — Progress Notes (Signed)
Patient ID: Stacy Deleon, female   DOB: 12-Nov-1980, 34 y.o.   MRN: [id number]  Subjective: Ms. Huxford returns the office for follow-up evaluation of right submetatarsal to hyperkeratotic lesion, verruca. She said the is  painful with pressure. Denies any redness or drainage or any swelling around the area. She did apply no medication to the area. No other complaints at this time. No acute changes since last appointment.  Objective: AAO x3, NAD DP/PT pulses palpable bilaterally, CRT less than 3 seconds Protective sensation intact with Simms Weinstein monofilament, vibratory sensation intact, Achilles tendon reflex intact Right foot sub-second metatarsal 2 hyperkeratotic lesion. Upon debridement lesion there is pinpoint bleeding and evidence of verruca at this time. There is no surrounding erythema, edema, drainage/purulence, malodor, or other clinical signs of infection.  No areas of pinpoint bony tenderness or pain with vibratory sensation. MMT 5/5, ROM WNL. No edema, erythema, increase in warmth to bilateral lower extremities.  No open lesions or pre-ulcerative lesions.  No pain with calf compression, swelling, warmth, erythema  Assessment: 34 year old female right submetatarsal 2 likely verruca  Plan: -All treatment options discussed with the patient including all alternatives, risks, complications.  -Lesion sharply debrided without complications to bleeding. Cantharone was applied followed by an occlusive bandage. Post procedure care was discussed the patient. Monitor for any signs or symptoms of infection and directed to call the office if any are to occur. -Follow-up in 2 weeks -Patient encouraged to call the office with any questions, concerns, change in symptoms.

## 2015-01-09 ENCOUNTER — Encounter: Payer: Medicaid Other | Admitting: Physical Therapy

## 2015-01-11 ENCOUNTER — Ambulatory Visit: Payer: Medicaid Other | Admitting: Physical Therapy

## 2015-01-20 ENCOUNTER — Ambulatory Visit: Payer: Medicaid Other | Admitting: Podiatry

## 2015-02-01 ENCOUNTER — Encounter: Payer: Self-pay | Admitting: Podiatry

## 2015-02-01 ENCOUNTER — Ambulatory Visit (INDEPENDENT_AMBULATORY_CARE_PROVIDER_SITE_OTHER): Payer: Medicaid Other | Admitting: Podiatry

## 2015-02-01 VITALS — BP 115/73 | HR 104 | Resp 18

## 2015-02-01 DIAGNOSIS — D492 Neoplasm of unspecified behavior of bone, soft tissue, and skin: Secondary | ICD-10-CM | POA: Diagnosis not present

## 2015-02-01 NOTE — Progress Notes (Signed)
Patient ID: Stacy Deleon, female   DOB: 05-28-1981, 34 y.o.   MRN: [id number]  Subjective: Stacy Deleon returns the office for follow-up evaluation of right submetatarsal two verruca. She states that overall she is doing better and she does not have as much as pain is what she did previously. She states a couple days the core cane out which she removed herself. She denies any surrounding redness or drainage. She denies any complications after the application a Cantharone last appointment. Denies any redness or drainage or any swelling around the area. She did apply no medication to the area. No other complaints at this time. No acute changes since last appointment.  Objective: AAO x3, NAD DP/PT pulses palpable bilaterally, CRT less than 3 seconds Protective sensation intact with Simms Weinstein monofilament, vibratory sensation intact, Achilles tendon reflex intact Right foot sub-second metatarsal 2 hyperkeratotic lesion. Upon debridement lesion there is mild pinpoint bleeding and evidence of verruca at this time. There is no surrounding erythema, edema, drainage/purulence, malodor, or other clinical signs of infection.  No areas of pinpoint bony tenderness or pain with vibratory sensation. MMT 5/5, ROM WNL. No edema, erythema, increase in warmth to bilateral lower extremities.  No open lesions or pre-ulcerative lesions.  No pain with calf compression, swelling, warmth, erythema  Assessment: 34 year old female right submetatarsal 2 likely verruca, resolving   Plan: -All treatment options discussed with the patient including all alternatives, risks, complications.  -Lesion sharply debrided without complications to bleeding. A second application of cantharone was applied followed by an occlusive bandage. Post procedure care was discussed the patient. Monitor for any signs or symptoms of infection and directed to call the office if any are to occur. -Follow-up in 2 weeks -Patient encouraged to call  the office with any questions, concerns, change in symptoms.

## 2015-02-06 ENCOUNTER — Emergency Department (HOSPITAL_COMMUNITY)
Admission: EM | Admit: 2015-02-06 | Discharge: 2015-02-06 | Disposition: A | Discharge status: Home / Self Care | Payer: Medicaid Other | Attending: Emergency Medicine | Admitting: Emergency Medicine

## 2015-02-06 DIAGNOSIS — F419 Anxiety disorder, unspecified: Secondary | ICD-10-CM | POA: Insufficient documentation

## 2015-02-06 DIAGNOSIS — F909 Attention-deficit hyperactivity disorder, unspecified type: Secondary | ICD-10-CM | POA: Insufficient documentation

## 2015-02-06 DIAGNOSIS — Z8639 Personal history of other endocrine, nutritional and metabolic disease: Secondary | ICD-10-CM | POA: Diagnosis not present

## 2015-02-06 DIAGNOSIS — Z87891 Personal history of nicotine dependence: Secondary | ICD-10-CM | POA: Insufficient documentation

## 2015-02-06 DIAGNOSIS — Z79899 Other long term (current) drug therapy: Secondary | ICD-10-CM | POA: Insufficient documentation

## 2015-02-06 DIAGNOSIS — G43909 Migraine, unspecified, not intractable, without status migrainosus: Secondary | ICD-10-CM | POA: Diagnosis not present

## 2015-02-06 DIAGNOSIS — K219 Gastro-esophageal reflux disease without esophagitis: Secondary | ICD-10-CM | POA: Diagnosis not present

## 2015-02-06 DIAGNOSIS — M545 Low back pain, unspecified: Secondary | ICD-10-CM

## 2015-02-06 MED ORDER — ONDANSETRON HCL 4 MG/2ML IJ SOLN
4.0000 mg | Freq: Once | INTRAMUSCULAR | Status: AC
  Administered 2015-02-06: 4 mg via INTRAVENOUS
  Filled 2015-02-06: qty 2

## 2015-02-06 MED ORDER — METHYLPREDNISOLONE SODIUM SUCC 125 MG IJ SOLR
125.0000 mg | Freq: Once | INTRAMUSCULAR | Status: AC
  Administered 2015-02-06: 125 mg via INTRAVENOUS
  Filled 2015-02-06: qty 2

## 2015-02-06 MED ORDER — DIPHENHYDRAMINE HCL 50 MG/ML IJ SOLN
25.0000 mg | Freq: Once | INTRAMUSCULAR | Status: AC
  Administered 2015-02-06: 25 mg via INTRAVENOUS
  Filled 2015-02-06: qty 1

## 2015-02-06 MED ORDER — HYDROMORPHONE HCL 1 MG/ML IJ SOLN
1.0000 mg | Freq: Once | INTRAMUSCULAR | Status: AC
  Administered 2015-02-06: 1 mg via INTRAVENOUS
  Filled 2015-02-06: qty 1

## 2015-02-06 MED ORDER — PREDNISONE 10 MG PO TABS: 20.0000 mg | ORAL_TABLET | Freq: Every day | ORAL | Status: DC

## 2015-02-06 NOTE — ED Notes (Signed)
Awake. Verbally responsive. A/O x4. Resp even and unlabored. No audible adventitious breath sounds noted. ABC's intact.  

## 2015-02-06 NOTE — ED Notes (Signed)
Pt reported lower back chronic pain s/p recent back sx in February. Pt denies urinary symptoms.

## 2015-02-06 NOTE — ED Notes (Signed)
Awake. Verbally responsive. A/O x4. Resp even and unlabored. No audible adventitious breath sounds noted. ABC's intact. IV saline lock patent and intact. 

## 2015-02-06 NOTE — ED Notes (Signed)
Pt c/o 10/10 chronic back pain, leg, and hip pain, worsened last night, pt states that her doctor believes she may have ruptured S1 and L5 disks. Pt states she is scheduled for a SI joint injection on her right side on 6-21. Pt states she took flexeril and zofran last night at 1900. Pt ambulatory with minimal difficulty. Pt denies loss of control of bowel or bladder.

## 2015-02-06 NOTE — ED Provider Notes (Signed)
CSN: [id number]     Arrival date & time 02/06/15  6073 History   First MD Initiated Contact with Patient 02/06/15 [phone]     Chief Complaint  Patient presents with  . Back Pain     (Consider location/radiation/quality/duration/timing/severity/associated sxs/prior Treatment) Patient is a 34 y.o. female presenting with back pain. The history is provided by the patient (the pt complains of back pain.  she has had surgery on her lower back.  she has not been taking any pain meds).  Back Pain Location:  Lumbar spine Quality:  Aching Radiates to:  Does not radiate Pain severity:  Moderate Pain is:  Same all the time Onset quality:  Gradual Timing:  Constant Associated symptoms: no abdominal pain, no chest pain and no headaches     Past Medical History  Diagnosis Date  . Bronchitis   . Polycystic ovary syndrome   . Anxiety   . Depression   . ADHD (attention deficit hyperactivity disorder)   . PONV (postoperative nausea and vomiting)   . Incontinence of urine   . GERD (gastroesophageal reflux disease)   . Neuromuscular disorder     MIGRAINES    Past Surgical History  Procedure Laterality Date  . Tubal ligation    . Hernia repair    . Plantar fascia surgery    . Lumbar laminectomy/decompression microdiscectomy Left 10/13/2014    Procedure: LEFT L4-L5 DISCECTOMY    (1 LEVEL);  Surgeon: Melina Schools, MD;  Location: [facility];  Service: Orthopedics;  Laterality: Left;   Family History  Problem Relation Age of Onset  . Hypertension Father   . Fibromyalgia Father   . Depression Father   . Cancer Father   . Bipolar disorder Brother   . Fibromyalgia Brother    History  Substance Use Topics  . Smoking status: Former Smoker -- 2.00 packs/day for 3 years    Types: Cigarettes  . Smokeless tobacco: Never Used  . Alcohol Use: No   OB History    Gravida Para Term Preterm AB TAB SAB Ectopic Multiple Living   3 3 3       3      Review of Systems  Constitutional: Negative for appetite  change and fatigue.  HENT: Negative for congestion, ear discharge and sinus pressure.   Eyes: Negative for discharge.  Respiratory: Negative for cough.   Cardiovascular: Negative for chest pain.  Gastrointestinal: Negative for abdominal pain and diarrhea.  Genitourinary: Negative for frequency and hematuria.  Musculoskeletal: Positive for back pain.  Skin: Negative for rash.  Neurological: Negative for seizures and headaches.  Psychiatric/Behavioral: Negative for hallucinations.      Allergies  Oxycodone and Other  Home Medications   Prior to Admission medications   Medication Sig Start Date End Date Taking? Authorizing Provider  cyclobenzaprine (FLEXERIL) 10 MG tablet Take 10 mg by mouth 3 (three) times daily as needed for muscle spasms.   Yes Historical Provider, MD  docusate sodium (COLACE) 100 MG capsule Take 1 capsule (100 mg total) by mouth 3 (three) times daily as needed for mild constipation. 10/13/14  Yes Melina Schools, MD  lisdexamfetamine (VYVANSE) 50 MG capsule Take 50 mg by mouth every morning.   Yes Historical Provider, MD  metFORMIN (GLUCOPHAGE) 500 MG tablet Take 500 mg by mouth 4 (four) times daily.   Yes Historical Provider, MD  methocarbamol (ROBAXIN) 500 MG tablet Take 1 tablet (500 mg total) by mouth 3 (three) times daily as needed for muscle spasms. 10/13/14  Yes  Melina Schools, MD  norelgestromin-ethinyl estradiol (ORTHO EVRA) 150-35 MCG/24HR transdermal patch Place 1 patch onto the skin once a week. 1 patch weekly for 3 weeks, then off for 1 week 01/11/15 01/11/16 Yes Historical Provider, MD  omeprazole (PRILOSEC) 20 MG capsule Take 20 mg by mouth every morning.   Yes Historical Provider, MD  ondansetron (ZOFRAN-ODT) 8 MG disintegrating tablet Take 8 mg by mouth every 8 (eight) hours as needed for nausea or vomiting.   Yes Historical Provider, MD  oxyCODONE-acetaminophen (PERCOCET) 10-325 MG per tablet Take 1 tablet by mouth every 4 (four) hours as needed for pain.  10/13/14  Yes Melina Schools, MD  ondansetron (ZOFRAN) 4 MG tablet Take 1 tablet (4 mg total) by mouth every 8 (eight) hours as needed for nausea or vomiting. Patient not taking: Reported on 02/06/2015 10/13/14   Melina Schools, MD  predniSONE (DELTASONE) 10 MG tablet Take 2 tablets (20 mg total) by mouth daily. 02/06/15   Milton Ferguson, MD   BP 111/62 mmHg  Pulse 63  Temp(Src) 98 F (36.7 C) (Oral)  Resp 18  SpO2 100%  LMP 02/05/2015 Physical Exam  Constitutional: She is oriented to person, place, and time. She appears well-developed.  HENT:  Head: Normocephalic.  Eyes: Conjunctivae and EOM are normal. No scleral icterus.  Neck: Neck supple. No thyromegaly present.  Cardiovascular: Normal rate and regular rhythm.  Exam reveals no gallop and no friction rub.   No murmur heard. Pulmonary/Chest: No stridor. She has no wheezes. She has no rales. She exhibits no tenderness.  Abdominal: She exhibits no distension. There is no tenderness. There is no rebound.  Musculoskeletal: Normal range of motion. She exhibits tenderness. She exhibits no edema.  Tender lumbar spine.  Pos straight leg raise on right  Lymphadenopathy:    She has no cervical adenopathy.  Neurological: She is oriented to person, place, and time. She exhibits normal muscle tone. Coordination normal.  Skin: No rash noted. No erythema.  Psychiatric: She has a normal mood and affect. Her behavior is normal.    ED Course  Procedures (including critical care time) Labs Review Labs Reviewed - No data to display  Imaging Review No results found.   EKG Interpretation None      MDM   Final diagnoses:  Back pain at L4-L5 level    Pt improved with dilaudid but had some itching.  She is to follow up with orhto md.  rx prednisone    Milton Ferguson, MD 02/06/15 1040

## 2015-02-06 NOTE — ED Notes (Signed)
Awake. Verbally responsive. A/O x4. Resp even and unlabored. No audible adventitious breath sounds noted. ABC's intact. IV saline lock patent and intact. Family at bedside. 

## 2015-02-06 NOTE — Discharge Instructions (Signed)
Follow up with your ortho md this week. °

## 2015-02-06 NOTE — ED Notes (Addendum)
Awake. Verbally responsive. A/O x4. Resp even and unlabored. No audible adventitious breath sounds noted. ABC's intact. Pt reported having itching after taking Dilaudid. IV saline lock patent and intact. Family at bedside.

## 2015-02-17 ENCOUNTER — Ambulatory Visit: Payer: Medicaid Other | Admitting: Podiatry

## 2015-03-17 ENCOUNTER — Emergency Department (HOSPITAL_COMMUNITY): Payer: No Typology Code available for payment source

## 2015-03-17 ENCOUNTER — Emergency Department (HOSPITAL_COMMUNITY)
Admission: EM | Admit: 2015-03-17 | Discharge: 2015-03-17 | Disposition: A | Discharge status: Home / Self Care | Payer: No Typology Code available for payment source | Attending: Emergency Medicine | Admitting: Emergency Medicine

## 2015-03-17 ENCOUNTER — Encounter (HOSPITAL_COMMUNITY): Payer: Self-pay | Admitting: Emergency Medicine

## 2015-03-17 DIAGNOSIS — Z8639 Personal history of other endocrine, nutritional and metabolic disease: Secondary | ICD-10-CM | POA: Diagnosis not present

## 2015-03-17 DIAGNOSIS — Z8709 Personal history of other diseases of the respiratory system: Secondary | ICD-10-CM | POA: Diagnosis not present

## 2015-03-17 DIAGNOSIS — Y92410 Unspecified street and highway as the place of occurrence of the external cause: Secondary | ICD-10-CM | POA: Diagnosis not present

## 2015-03-17 DIAGNOSIS — Y939 Activity, unspecified: Secondary | ICD-10-CM | POA: Insufficient documentation

## 2015-03-17 DIAGNOSIS — S199XXA Unspecified injury of neck, initial encounter: Secondary | ICD-10-CM | POA: Insufficient documentation

## 2015-03-17 DIAGNOSIS — Z8659 Personal history of other mental and behavioral disorders: Secondary | ICD-10-CM | POA: Insufficient documentation

## 2015-03-17 DIAGNOSIS — K219 Gastro-esophageal reflux disease without esophagitis: Secondary | ICD-10-CM | POA: Diagnosis not present

## 2015-03-17 DIAGNOSIS — Z79899 Other long term (current) drug therapy: Secondary | ICD-10-CM | POA: Insufficient documentation

## 2015-03-17 DIAGNOSIS — M545 Low back pain: Secondary | ICD-10-CM

## 2015-03-17 DIAGNOSIS — S3992XA Unspecified injury of lower back, initial encounter: Secondary | ICD-10-CM | POA: Diagnosis not present

## 2015-03-17 DIAGNOSIS — Z87891 Personal history of nicotine dependence: Secondary | ICD-10-CM | POA: Insufficient documentation

## 2015-03-17 DIAGNOSIS — Z7952 Long term (current) use of systemic steroids: Secondary | ICD-10-CM | POA: Insufficient documentation

## 2015-03-17 DIAGNOSIS — Z8669 Personal history of other diseases of the nervous system and sense organs: Secondary | ICD-10-CM | POA: Insufficient documentation

## 2015-03-17 DIAGNOSIS — Y999 Unspecified external cause status: Secondary | ICD-10-CM | POA: Diagnosis not present

## 2015-03-17 MED ORDER — HYDROCODONE-ACETAMINOPHEN 5-325 MG PO TABS
2.0000 | ORAL_TABLET | Freq: Once | ORAL | Status: DC
  Filled 2015-03-17: qty 2
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
## 2015-03-17 NOTE — ED Notes (Signed)
Pt refused pain medications, sts "i told her i didn't want that, i don't need your sympathy".  This RN offered to ask for another medication, pt refused.  Pt refused ice or other intervention at this time.  NAD.
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
## 2015-05-03 ENCOUNTER — Ambulatory Visit: Payer: No Typology Code available for payment source | Attending: Orthopedic Surgery | Admitting: Physical Therapy

## 2015-06-29 ENCOUNTER — Emergency Department (HOSPITAL_COMMUNITY)
Admission: EM | Admit: 2015-06-29 | Discharge: 2015-06-29 | Disposition: A | Discharge status: Home / Self Care | Payer: Medicaid Other | Attending: Emergency Medicine | Admitting: Emergency Medicine

## 2015-06-29 ENCOUNTER — Encounter (HOSPITAL_COMMUNITY): Payer: Self-pay | Admitting: Emergency Medicine

## 2015-06-29 DIAGNOSIS — T23131A Burn of first degree of multiple right fingers (nail), not including thumb, initial encounter: Secondary | ICD-10-CM | POA: Insufficient documentation

## 2015-06-29 DIAGNOSIS — G43909 Migraine, unspecified, not intractable, without status migrainosus: Secondary | ICD-10-CM | POA: Insufficient documentation

## 2015-06-29 DIAGNOSIS — Z79899 Other long term (current) drug therapy: Secondary | ICD-10-CM | POA: Insufficient documentation

## 2015-06-29 DIAGNOSIS — Z7952 Long term (current) use of systemic steroids: Secondary | ICD-10-CM | POA: Insufficient documentation

## 2015-06-29 DIAGNOSIS — Y9289 Other specified places as the place of occurrence of the external cause: Secondary | ICD-10-CM | POA: Insufficient documentation

## 2015-06-29 DIAGNOSIS — Z792 Long term (current) use of antibiotics: Secondary | ICD-10-CM | POA: Insufficient documentation

## 2015-06-29 DIAGNOSIS — Z8709 Personal history of other diseases of the respiratory system: Secondary | ICD-10-CM | POA: Insufficient documentation

## 2015-06-29 DIAGNOSIS — F329 Major depressive disorder, single episode, unspecified: Secondary | ICD-10-CM | POA: Insufficient documentation

## 2015-06-29 DIAGNOSIS — F419 Anxiety disorder, unspecified: Secondary | ICD-10-CM | POA: Insufficient documentation

## 2015-06-29 DIAGNOSIS — K219 Gastro-esophageal reflux disease without esophagitis: Secondary | ICD-10-CM | POA: Insufficient documentation

## 2015-06-29 DIAGNOSIS — F909 Attention-deficit hyperactivity disorder, unspecified type: Secondary | ICD-10-CM | POA: Insufficient documentation

## 2015-06-29 DIAGNOSIS — Z87891 Personal history of nicotine dependence: Secondary | ICD-10-CM | POA: Insufficient documentation

## 2015-06-29 DIAGNOSIS — Y9389 Activity, other specified: Secondary | ICD-10-CM | POA: Insufficient documentation

## 2015-06-29 DIAGNOSIS — X118XXA Contact with other hot tap-water, initial encounter: Secondary | ICD-10-CM | POA: Insufficient documentation

## 2015-06-29 DIAGNOSIS — Y998 Other external cause status: Secondary | ICD-10-CM | POA: Insufficient documentation

## 2015-06-29 MED ORDER — IBUPROFEN 200 MG PO TABS
600.0000 mg | ORAL_TABLET | Freq: Once | ORAL | Status: AC
  Administered 2015-06-29: 600 mg via ORAL
  Filled 2015-06-29: qty 3

## 2015-06-29 MED ORDER — IBUPROFEN 600 MG PO TABS: 600.0000 mg | ORAL_TABLET | Freq: Four times a day (QID) | ORAL | Status: DC | PRN

## 2015-06-29 MED ORDER — BACITRACIN ZINC 500 UNIT/GM EX OINT: TOPICAL_OINTMENT | Freq: Two times a day (BID) | CUTANEOUS | Status: AC

## 2015-06-29 MED ORDER — TRAMADOL HCL 50 MG PO TABS
50.0000 mg | ORAL_TABLET | Freq: Once | ORAL | Status: AC
  Administered 2015-06-29: 50 mg via ORAL
  Filled 2015-06-29: qty 1

## 2015-06-29 MED ORDER — TRAMADOL HCL 50 MG PO TABS: 50.0000 mg | ORAL_TABLET | Freq: Four times a day (QID) | ORAL | Status: DC | PRN

## 2015-06-29 MED ORDER — BACITRACIN ZINC 500 UNIT/GM EX OINT
TOPICAL_OINTMENT | Freq: Two times a day (BID) | CUTANEOUS | Status: DC
  Filled 2015-06-29: qty 1.8
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
## 2015-07-22 ENCOUNTER — Emergency Department (HOSPITAL_BASED_OUTPATIENT_CLINIC_OR_DEPARTMENT_OTHER)
Admission: EM | Admit: 2015-07-22 | Discharge: 2015-07-22 | Disposition: A | Discharge status: Home / Self Care | Payer: Worker's Compensation | Attending: Emergency Medicine | Admitting: Emergency Medicine

## 2015-07-22 ENCOUNTER — Encounter (HOSPITAL_BASED_OUTPATIENT_CLINIC_OR_DEPARTMENT_OTHER): Payer: Self-pay | Admitting: *Deleted

## 2015-07-22 DIAGNOSIS — Y990 Civilian activity done for income or pay: Secondary | ICD-10-CM | POA: Insufficient documentation

## 2015-07-22 DIAGNOSIS — R110 Nausea: Secondary | ICD-10-CM | POA: Insufficient documentation

## 2015-07-22 DIAGNOSIS — Y9289 Other specified places as the place of occurrence of the external cause: Secondary | ICD-10-CM | POA: Diagnosis not present

## 2015-07-22 DIAGNOSIS — K219 Gastro-esophageal reflux disease without esophagitis: Secondary | ICD-10-CM | POA: Diagnosis not present

## 2015-07-22 DIAGNOSIS — Z7952 Long term (current) use of systemic steroids: Secondary | ICD-10-CM | POA: Diagnosis not present

## 2015-07-22 DIAGNOSIS — M545 Low back pain, unspecified: Secondary | ICD-10-CM

## 2015-07-22 DIAGNOSIS — F329 Major depressive disorder, single episode, unspecified: Secondary | ICD-10-CM | POA: Diagnosis not present

## 2015-07-22 DIAGNOSIS — S3992XA Unspecified injury of lower back, initial encounter: Secondary | ICD-10-CM | POA: Diagnosis not present

## 2015-07-22 DIAGNOSIS — Z87891 Personal history of nicotine dependence: Secondary | ICD-10-CM | POA: Diagnosis not present

## 2015-07-22 DIAGNOSIS — Y9389 Activity, other specified: Secondary | ICD-10-CM | POA: Diagnosis not present

## 2015-07-22 DIAGNOSIS — X58XXXA Exposure to other specified factors, initial encounter: Secondary | ICD-10-CM | POA: Insufficient documentation

## 2015-07-22 DIAGNOSIS — F909 Attention-deficit hyperactivity disorder, unspecified type: Secondary | ICD-10-CM | POA: Diagnosis not present

## 2015-07-22 DIAGNOSIS — Z8669 Personal history of other diseases of the nervous system and sense organs: Secondary | ICD-10-CM | POA: Insufficient documentation

## 2015-07-22 DIAGNOSIS — F419 Anxiety disorder, unspecified: Secondary | ICD-10-CM | POA: Diagnosis not present

## 2015-07-22 DIAGNOSIS — Z79899 Other long term (current) drug therapy: Secondary | ICD-10-CM | POA: Diagnosis not present

## 2015-07-22 DIAGNOSIS — Z8709 Personal history of other diseases of the respiratory system: Secondary | ICD-10-CM | POA: Diagnosis not present

## 2015-07-22 MED ORDER — KETOROLAC TROMETHAMINE 60 MG/2ML IM SOLN
60.0000 mg | Freq: Once | INTRAMUSCULAR | Status: AC
  Administered 2015-07-22: 60 mg via INTRAMUSCULAR
  Filled 2015-07-22: qty 2

## 2015-07-22 MED ORDER — TRAMADOL HCL 50 MG PO TABS: 50.0000 mg | ORAL_TABLET | Freq: Four times a day (QID) | ORAL | Status: DC | PRN

## 2015-07-22 NOTE — ED Provider Notes (Signed)
CSN: [id number]   Arrival date & time 07/22/15 1629  History  By signing my name below, I, Altamease Oiler, attest that this documentation has been prepared under the direction and in the presence of Quintella Reichert, MD. Electronically Signed: Altamease Oiler, ED Scribe. 07/22/2015. 7:05 PM.  Chief Complaint  Patient presents with  . Back Pain    HPI The history is provided by the patient. No language interpreter was used.   Stacy Deleon is a 34 y.o. female with history of DDD s/p L4-L5 discectomy in February 2016 who presents to the Emergency Department complaining of constant, burning, right-lower back pain with sudden onset today at work while lifting a garage door. At the onset of pain she felt a "pop" in the back. Walking exacerbates the pain and causes spasms. The pain radiates down the right leg to the hip.  Associated symptoms include nausea with the spasming, 1 episode of urinary incontinence (notes similar episodes before her back surgery), and tingling in the right foot. Pt denies fever and vomiting. She states that she has Robaxin at home but "got rid of everything else". She denies chance of pregnancy as she has past surgical history of tubal ligation.  Past Medical History  Diagnosis Date  . Bronchitis   . Polycystic ovary syndrome   . Anxiety   . Depression   . ADHD (attention deficit hyperactivity disorder)   . PONV (postoperative nausea and vomiting)   . Incontinence of urine   . GERD (gastroesophageal reflux disease)   . Neuromuscular disorder ([place])     MIGRAINES     Past Surgical History  Procedure Laterality Date  . Tubal ligation    . Hernia repair    . Plantar fascia surgery    . Lumbar laminectomy/decompression microdiscectomy Left 10/13/2014    Procedure: LEFT L4-L5 DISCECTOMY    (1 LEVEL);  Surgeon: Melina Schools, MD;  Location: [facility];  Service: Orthopedics;  Laterality: Left;    Family History  Problem Relation Age of Onset  . Hypertension Father    . Fibromyalgia Father   . Depression Father   . Cancer Father   . Bipolar disorder Brother   . Fibromyalgia Brother     Social History  Substance Use Topics  . Smoking status: Former Smoker -- 2.00 packs/day for 3 years    Types: Cigarettes  . Smokeless tobacco: Never Used  . Alcohol Use: No     Review of Systems  Constitutional: Negative for fever.  Gastrointestinal: Positive for nausea. Negative for vomiting.  Genitourinary:       Urinary incontinence  Musculoskeletal: Positive for back pain.  Neurological:       Tingling in the right foot  All other systems reviewed and are negative.  Home Medications   Prior to Admission medications   Medication Sig Start Date End Date Taking? Authorizing Provider  cyclobenzaprine (FLEXERIL) 10 MG tablet Take 10 mg by mouth 3 (three) times daily as needed for muscle spasms.    Historical Provider, MD  docusate sodium (COLACE) 100 MG capsule Take 1 capsule (100 mg total) by mouth 3 (three) times daily as needed for mild constipation. 10/13/14   Melina Schools, MD  ibuprofen (ADVIL,MOTRIN) 600 MG tablet Take 1 tablet (600 mg total) by mouth every 6 (six) hours as needed for moderate pain. 06/29/15   Junius Creamer, NP  lisdexamfetamine (VYVANSE) 50 MG capsule Take 50 mg by mouth every morning.    Historical Provider, MD  metFORMIN (GLUCOPHAGE) 500  MG tablet Take 500 mg by mouth 4 (four) times daily.    Historical Provider, MD  methocarbamol (ROBAXIN) 500 MG tablet Take 1 tablet (500 mg total) by mouth 3 (three) times daily as needed for muscle spasms. 10/13/14   Melina Schools, MD  norelgestromin-ethinyl estradiol (ORTHO EVRA) 150-35 MCG/24HR transdermal patch Place 1 patch onto the skin once a week. 1 patch weekly for 3 weeks, then off for 1 week 01/11/15 01/11/16  Historical Provider, MD  omeprazole (PRILOSEC) 20 MG capsule Take 20 mg by mouth every morning.    Historical Provider, MD  ondansetron (ZOFRAN) 4 MG tablet Take 1 tablet (4 mg total) by  mouth every 8 (eight) hours as needed for nausea or vomiting. Patient not taking: Reported on 02/06/2015 10/13/14   Melina Schools, MD  ondansetron (ZOFRAN-ODT) 8 MG disintegrating tablet Take 8 mg by mouth every 8 (eight) hours as needed for nausea or vomiting.    Historical Provider, MD  oxyCODONE-acetaminophen (PERCOCET) 10-325 MG per tablet Take 1 tablet by mouth every 4 (four) hours as needed for pain. 10/13/14   Melina Schools, MD  predniSONE (DELTASONE) 10 MG tablet Take 2 tablets (20 mg total) by mouth daily. 02/06/15   Milton Ferguson, MD  traMADol (ULTRAM) 50 MG tablet Take 1 tablet (50 mg total) by mouth every 6 (six) hours as needed for severe pain. 06/29/15   Junius Creamer, NP    Allergies  Oxycodone and Other  Triage Vitals: BP 121/58 mmHg  Pulse 103  Temp(Src) 99.4 F (37.4 C) (Oral)  Resp 20  Ht 5\' 6"  (1.676 m)  Wt 230 lb (104.327 kg)  BMI 37.14 kg/m2  SpO2 100%  LMP 06/22/2015  Physical Exam  Constitutional: She is oriented to person, place, and time. She appears well-developed and well-nourished.  HENT:  Head: Normocephalic and atraumatic.  Cardiovascular: Normal rate and regular rhythm.   No murmur heard. Pulmonary/Chest: Effort normal and breath sounds normal. No respiratory distress.  Abdominal: Soft. There is no tenderness. There is no rebound and no guarding.  Musculoskeletal: She exhibits tenderness. She exhibits no edema.  Lower lumbar TTP 5/5 strength BLEs  Neurological: She is alert and oriented to person, place, and time.  No saddle anesthesia 2+ patellar reflexes bilaterally No clonus  Skin: Skin is warm and dry.  Psychiatric: She has a normal mood and affect. Her behavior is normal.  Nursing note and vitals reviewed.   ED Course  Procedures   DIAGNOSTIC STUDIES: Oxygen Saturation is 100% on RA, normal by my interpretation.    COORDINATION OF CARE: 7:01 PM Discussed treatment plan which includes pain management with pt at bedside and pt agreed to  plan.  MDM   Final diagnoses:  Acute low back pain   Patient with history of low back pain here for exacerbation of her low back pain after a pushing/lifting injury. She is neurovascularly intact on examination. Treating her pain with outpatient orthopedics  follow-up.   Return precautions were discussed.  I personally performed the services described in this documentation, which was scribed in my presence. The recorded information has been reviewed and is accurate.    Quintella Reichert, MD 07/23/15 1451

## 2015-07-22 NOTE — Discharge Instructions (Signed)
Back Pain, Adult °Back pain is very common in adults. The cause of back pain is rarely dangerous and the pain often gets better over time. The cause of your back pain may not be known. Some common causes of back pain include: °· Strain of the muscles or ligaments supporting the spine. °· Wear and tear (degeneration) of the spinal disks. °· Arthritis. °· Direct injury to the back. °For many people, back pain may return. Since back pain is rarely dangerous, most people can learn to manage this condition on their own. °HOME CARE INSTRUCTIONS °Watch your back pain for any changes. The following actions may help to lessen any discomfort you are feeling: °· Remain active. It is stressful on your back to sit or stand in one place for long periods of time. Do not sit, drive, or stand in one place for more than 30 minutes at a time. Take short walks on even surfaces as soon as you are able. Try to increase the length of time you walk each day. °· Exercise regularly as directed by your health care provider. Exercise helps your back heal faster. It also helps avoid future injury by keeping your muscles strong and flexible. °· Do not stay in bed. Resting more than 1-2 days can delay your recovery. °· Pay attention to your body when you bend and lift. The most comfortable positions are those that put less stress on your recovering back. Always use proper lifting techniques, including: °· Bending your knees. °· Keeping the load close to your body. °· Avoiding twisting. °· Find a comfortable position to sleep. Use a firm mattress and lie on your side with your knees slightly bent. If you lie on your back, put a pillow under your knees. °· Avoid feeling anxious or stressed. Stress increases muscle tension and can worsen back pain. It is important to recognize when you are anxious or stressed and learn ways to manage it, such as with exercise. °· Take medicines only as directed by your health care provider. Over-the-counter  medicines to reduce pain and inflammation are often the most helpful. Your health care provider may prescribe muscle relaxant drugs. These medicines help dull your pain so you can more quickly return to your normal activities and healthy exercise. °· Apply ice to the injured area: °· Put ice in a plastic bag. °· Place a towel between your skin and the bag. °· Leave the ice on for 20 minutes, 2-3 times a day for the first 2-3 days. After that, ice and heat may be alternated to reduce pain and spasms. °· Maintain a healthy weight. Excess weight puts extra stress on your back and makes it difficult to maintain good posture. °SEEK MEDICAL CARE IF: °· You have pain that is not relieved with rest or medicine. °· You have increasing pain going down into the legs or buttocks. °· You have pain that does not improve in one week. °· You have night pain. °· You lose weight. °· You have a fever or chills. °SEEK IMMEDIATE MEDICAL CARE IF:  °· You develop new bowel or bladder control problems. °· You have unusual weakness or numbness in your arms or legs. °· You develop nausea or vomiting. °· You develop abdominal pain. °· You feel faint. °  °This information is not intended to replace advice given to you by your health care provider. Make sure you discuss any questions you have with your health care provider. °  °Document Released: 08/19/2005 Document Revised: 09/09/2014 Document Reviewed: 12/21/2013 °Elsevier Interactive Patient Education ©2016 Elsevier   Inc. Sciatica Sciatica is pain, weakness, numbness, or tingling along the path of the sciatic nerve. The nerve starts in the lower back and runs down the back of each leg. The nerve controls the muscles in the lower leg and in the back of the knee, while also providing sensation to the back of the thigh, lower leg, and the sole of your foot. Sciatica is a symptom of another medical condition. For instance, nerve damage or certain conditions, such as a herniated disk or bone  spur on the spine, pinch or put pressure on the sciatic nerve. This causes the pain, weakness, or other sensations normally associated with sciatica. Generally, sciatica only affects one side of the body. CAUSES   Herniated or slipped disc.  Degenerative disk disease.  A pain disorder involving the narrow muscle in the buttocks (piriformis syndrome).  Pelvic injury or fracture.  Pregnancy.  Tumor (rare). SYMPTOMS  Symptoms can vary from mild to very severe. The symptoms usually travel from the low back to the buttocks and down the back of the leg. Symptoms can include:  Mild tingling or dull aches in the lower back, leg, or hip.  Numbness in the back of the calf or sole of the foot.  Burning sensations in the lower back, leg, or hip.  Sharp pains in the lower back, leg, or hip.  Leg weakness.  Severe back pain inhibiting movement. These symptoms may get worse with coughing, sneezing, laughing, or prolonged sitting or standing. Also, being overweight may worsen symptoms. DIAGNOSIS  Your caregiver will perform a physical exam to look for common symptoms of sciatica. He or she may ask you to do certain movements or activities that would trigger sciatic nerve pain. Other tests may be performed to find the cause of the sciatica. These may include:  Blood tests.  X-rays.  Imaging tests, such as an MRI or CT scan. TREATMENT  Treatment is directed at the cause of the sciatic pain. Sometimes, treatment is not necessary and the pain and discomfort goes away on its own. If treatment is needed, your caregiver may suggest:  Over-the-counter medicines to relieve pain.  Prescription medicines, such as anti-inflammatory medicine, muscle relaxants, or narcotics.  Applying heat or ice to the painful area.  Steroid injections to lessen pain, irritation, and inflammation around the nerve.  Reducing activity during periods of pain.  Exercising and stretching to strengthen your abdomen  and improve flexibility of your spine. Your caregiver may suggest losing weight if the extra weight makes the back pain worse.  Physical therapy.  Surgery to eliminate what is pressing or pinching the nerve, such as a bone spur or part of a herniated disk. HOME CARE INSTRUCTIONS   Only take over-the-counter or prescription medicines for pain or discomfort as directed by your caregiver.  Apply ice to the affected area for 20 minutes, 3-4 times a day for the first 48-72 hours. Then try heat in the same way.  Exercise, stretch, or perform your usual activities if these do not aggravate your pain.  Attend physical therapy sessions as directed by your caregiver.  Keep all follow-up appointments as directed by your caregiver.  Do not wear high heels or shoes that do not provide proper support.  Check your mattress to see if it is too soft. A firm mattress may lessen your pain and discomfort. SEEK IMMEDIATE MEDICAL CARE IF:   You lose control of your bowel or bladder (incontinence).  You have increasing weakness in the lower back,  pelvis, buttocks, or legs.  You have redness or swelling of your back.  You have a burning sensation when you urinate.  You have pain that gets worse when you lie down or awakens you at night.  Your pain is worse than you have experienced in the past.  Your pain is lasting longer than 4 weeks.  You are suddenly losing weight without reason. MAKE SURE YOU:  Understand these instructions.  Will watch your condition.  Will get help right away if you are not doing well or get worse.   This information is not intended to replace advice given to you by your health care provider. Make sure you discuss any questions you have with your health care provider.   Document Released: 08/13/2001 Document Revised: 05/10/2015 Document Reviewed: 12/29/2011 Elsevier Interactive Patient Education [employer].

## 2015-07-22 NOTE — ED Notes (Signed)
Patient states that she was lifting a garage door at work and had a sudden onset of back spasms/burning that shoots down her right leg

## 2015-11-30 ENCOUNTER — Emergency Department (HOSPITAL_COMMUNITY)
Admission: EM | Admit: 2015-11-30 | Discharge: 2015-11-30 | Discharge status: Against Medical Advice | Payer: No Typology Code available for payment source

## 2015-11-30 NOTE — ED Notes (Signed)
Pt called for triage no response from lobby 

## 2015-12-20 ENCOUNTER — Encounter: Payer: Self-pay | Admitting: Vascular Surgery

## 2015-12-26 ENCOUNTER — Encounter: Payer: Self-pay | Admitting: Vascular Surgery

## 2015-12-26 ENCOUNTER — Ambulatory Visit (INDEPENDENT_AMBULATORY_CARE_PROVIDER_SITE_OTHER): Payer: Worker's Compensation | Admitting: Vascular Surgery

## 2015-12-26 VITALS — BP 111/84 | HR 90 | Temp 99.5°F | Resp 18 | Ht 65.5 in | Wt 238.5 lb

## 2015-12-26 DIAGNOSIS — M5136 Other intervertebral disc degeneration, lumbar region: Secondary | ICD-10-CM | POA: Diagnosis not present

## 2015-12-26 NOTE — Progress Notes (Signed)
[facility] and [facility]in Specialist of [place]  Patient name: Stacy Deleon MRN: [id number] DOB: [birth year] Sex: female  REASON FOR CONSULT: Discuss anterior exposure for L4-5 spine surgery  HPI: Stacy Deleon is a 35 y.o. female, who is seen today for discussion of anterior approach for L4-5 disc surgery. She reports initially having a posterior base microdiscectomy in February 2016. She did well until she suffered a injury in November 2016. Since that time she's had severe back pain. She was seen by Dr. Shon Baton who is recommended anterior exposure for L4-5 disc fusion. She is here today for me discussed my role in the anterior approach. She has had prior tubal ligation and also has had an umbilical hernia repair with mesh. No other intra-abdominal surgery.  Past Medical History  Diagnosis Date  . Bronchitis   . Polycystic ovary syndrome   . Anxiety   . Depression   . ADHD (attention deficit hyperactivity disorder)   . PONV (postoperative nausea and vomiting)   . Incontinence of urine   . GERD (gastroesophageal reflux disease)   . Neuromuscular disorder (HCC)     MIGRAINES     Family History  Problem Relation Age of Onset  . Hypertension Father   . Fibromyalgia Father   . Depression Father   . Cancer Father   . Bipolar disorder Brother   . Fibromyalgia Brother     SOCIAL HISTORY: Social History   Social History  . Marital Status: Single    Spouse Name: N/A  . Number of Children: N/A  . Years of Education: N/A   Occupational History  . Not on file.   Social History Main Topics  . Smoking status: Former Smoker -- 2.00 packs/day for 3 years    Types: Cigarettes    Quit date: 10/03/2013  . Smokeless tobacco: Never Used  . Alcohol Use: No  . Drug Use: No  . Sexual Activity: Yes    Birth Control/ Protection: None     Comment: last intercourse Oct 30th   Other Topics Concern  . Not on file   Social History Narrative    Allergies  Allergen Reactions  .  Oxycodone Itching    Takes percocet with benadryl to help with itching  . Other Other (See Comments)    The patient ask if she is given anything iv push for it to be done slowly because if its too fast she has terrible anxiety    Current Outpatient Prescriptions  Medication Sig Dispense Refill  . HYDROcodone-acetaminophen (NORCO/VICODIN) 5-325 MG tablet Take 1 tablet by mouth every 6 (six) hours as needed for moderate pain.    . methocarbamol (ROBAXIN) 500 MG tablet Take 1 tablet (500 mg total) by mouth 3 (three) times daily as needed for muscle spasms. 60 tablet 0  . omeprazole (PRILOSEC) 20 MG capsule Take 20 mg by mouth every morning.    . pregabalin (LYRICA) 75 MG capsule Take 75 mg by mouth 3 (three) times daily.    Marland Kitchen ibuprofen (ADVIL,MOTRIN) 600 MG tablet Take 1 tablet (600 mg total) by mouth every 6 (six) hours as needed for moderate pain. (Patient not taking: Reported on 12/26/2015) 30 tablet 0  . lisdexamfetamine (VYVANSE) 50 MG capsule Take 50 mg by mouth every morning. Reported on 12/26/2015    . metFORMIN (GLUCOPHAGE) 500 MG tablet Take 500 mg by mouth 4 (four) times daily. Reported on 12/26/2015    . norelgestromin-ethinyl estradiol (ORTHO EVRA) 150-35 MCG/24HR transdermal patch Place 1 patch  onto the skin once a week. Reported on 12/26/2015    . traMADol (ULTRAM) 50 MG tablet Take 1 tablet (50 mg total) by mouth every 6 (six) hours as needed. (Patient not taking: Reported on 12/26/2015) 10 tablet 0  . [DISCONTINUED] sertraline (ZOLOFT) 100 MG tablet Take 100 mg by mouth daily.       No current facility-administered medications for this visit.    REVIEW OF SYSTEMS:  [X]  denotes positive finding, [ ]  denotes negative finding Cardiac  Comments:  Chest pain or chest pressure:    Shortness of breath upon exertion:    Short of breath when lying flat:    Irregular heart rhythm:        Vascular    Pain in calf, thigh, or hip brought on by ambulation: x Neurogenic   Pain in feet at  night that wakes you up from your sleep:     Blood clot in your veins:    Leg swelling:         Pulmonary    Oxygen at home:    Productive cough:     Wheezing:         Neurologic    Sudden weakness in arms or legs:     Sudden numbness in arms or legs:     Sudden onset of difficulty speaking or slurred speech:    Temporary loss of vision in one eye:     Problems with dizziness:         Gastrointestinal    Blood in stool:     Vomited blood:         Genitourinary    Burning when urinating:     Blood in urine:        Psychiatric    Major depression:         Hematologic    Bleeding problems:    Problems with blood clotting too easily:        Skin    Rashes or ulcers:        Constitutional    Fever or chills:      PHYSICAL EXAM: Filed Vitals:   12/26/15 1317  BP: 111/84  Pulse: 90  Temp: 99.5 F (37.5 C)  TempSrc: Oral  Resp: 18  Height: 5' 5.5" (1.664 m)  Weight: 238 lb 8 oz (108.183 kg)  SpO2: 99%    GENERAL: The patient is a well-nourished female, in no acute distress. The vital signs are documented above. CARDIAC: There is a regular rate and rhythm.  VASCULAR: 2+ radial and 2+ dorsalis pedis pulses bilaterally PULMONARY: There is good air exchange bilaterally without wheezing or rales. ABDOMEN: Soft and non-tender with normal pitched bowel sounds. Midline incision above the level of the umbilicus. MUSCULOSKELETAL: There are no major deformities or cyanosis. NEUROLOGIC: No focal weakness or paresthesias are detected. SKIN: There are no ulcers or rashes noted. PSYCHIATRIC: The patient has a normal affect.    MEDICAL ISSUES: I discussed my role in anterior exposure. Described mobilization of intraperitoneal contents and also mobilization of left ureter. Explained mobilization of arterial and venous structures over the L4-5 disc. Explain potential injury of all of these. Explained that venous injury was most common concern and discussed repair of this. She  is obese but I do not feel that she is a prohibitive risk for surgery. Explained that would have a some scarring around the prior mesh umbilical hernia repair but this also would not prevent safe anterior exposure. GOES discussed with the  patient's aunt who is present and her Workmen's Science writer at the end of our visit. We're available to assist when the her 47 disc surgery as planned   Socorro Kanitz [facility] [place]: [phone]     g

## 2015-12-28 ENCOUNTER — Encounter: Payer: Self-pay | Admitting: Orthopedic Surgery
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
## 2016-02-22 ENCOUNTER — Ambulatory Visit: Payer: Self-pay | Admitting: Physician Assistant

## 2016-03-06 ENCOUNTER — Encounter (HOSPITAL_COMMUNITY)
Admission: RE | Admit: 2016-03-06 | Discharge: 2016-03-06 | Disposition: A | Discharge status: Home / Self Care | Payer: Worker's Compensation | Source: Ambulatory Visit | Attending: Orthopedic Surgery | Admitting: Orthopedic Surgery

## 2016-03-06 ENCOUNTER — Encounter (HOSPITAL_COMMUNITY): Payer: Self-pay

## 2016-03-06 ENCOUNTER — Other Ambulatory Visit: Payer: Self-pay

## 2016-03-06 DIAGNOSIS — Z01812 Encounter for preprocedural laboratory examination: Secondary | ICD-10-CM | POA: Insufficient documentation

## 2016-03-06 DIAGNOSIS — K219 Gastro-esophageal reflux disease without esophagitis: Secondary | ICD-10-CM | POA: Insufficient documentation

## 2016-03-06 DIAGNOSIS — Z01818 Encounter for other preprocedural examination: Secondary | ICD-10-CM | POA: Diagnosis present

## 2016-03-06 DIAGNOSIS — Z0183 Encounter for blood typing: Secondary | ICD-10-CM | POA: Diagnosis not present

## 2016-03-06 DIAGNOSIS — Z87891 Personal history of nicotine dependence: Secondary | ICD-10-CM | POA: Insufficient documentation

## 2016-03-06 HISTORY — DX: Headache: R51

## 2016-03-06 HISTORY — DX: Headache, unspecified: R51.9

## 2016-03-06 LAB — BASIC METABOLIC PANEL
Anion gap: 8 (ref 5–15)
BUN: 9 mg/dL (ref 6–20)
CALCIUM: 9.4 mg/dL (ref 8.9–10.3)
CO2: 24 mmol/L (ref 22–32)
CREATININE: 0.79 mg/dL (ref 0.44–1.00)
Chloride: 104 mmol/L (ref 101–111)
GFR calc non Af Amer: >60 mL/min (ref >60)
Glucose, Bld: 112 mg/dL — ABNORMAL HIGH (ref 65–99)
Potassium: 3.7 mmol/L (ref 3.5–5.1)
Sodium: 136 mmol/L (ref 135–145)

## 2016-03-06 LAB — CBC
HCT: 40.4 % (ref 36.0–46.0)
Hemoglobin: 13.2 g/dL (ref 12.0–15.0)
MCH: 28.1 pg (ref 26.0–34.0)
MCHC: 32.7 g/dL (ref 30.0–36.0)
MCV: 86.1 fL (ref 78.0–100.0)
PLATELETS: 320 10*3/uL (ref 150–400)
RBC: 4.69 MIL/uL (ref 3.87–5.11)
RDW: 12.6 % (ref 11.5–15.5)
WBC: 6.8 10*3/uL (ref 4.0–10.5)

## 2016-03-06 LAB — SURGICAL PCR SCREEN
MRSA, PCR: NEGATIVE
STAPHYLOCOCCUS AUREUS: NEGATIVE

## 2016-03-06 LAB — TYPE AND SCREEN
ABO/RH(D): O POS
Antibody Screen: NEGATIVE

## 2016-03-06 LAB — ABO/RH: ABO/RH(D): O POS

## 2016-03-06 LAB — HCG, SERUM, QUALITATIVE: Preg, Serum: NEGATIVE

## 2016-03-06 NOTE — Pre-Procedure Instructions (Signed)
Stacy Deleon  03/06/2016      [facility] [address], [place] - [address] [place] [address] Phone: [phone] Fax: [phone]    Your procedure is scheduled on July 12  Report to [place] at 630 A.M.  Call this number if you have problems the morning of surgery:  [phone]   Remember:  Do not eat food or drink liquids after midnight.  Take these medicines the morning of surgery with A SIP OF WATER Hydrocodone-acetaminophen (norco) if needed, methocarbamol (Robaxin) if needed, Omeprazole (Prilosec), Ondansetron (Zofran) if needed, Pregabalin (Lyrica)  Stop taking aspirin, BC's, Goody's, Herbal medications, Fish Oil, Ibuprofen, Advil, Motrin, Aleve, vitamins   Do not wear jewelry, make-up or nail polish.  Do not wear lotions, powders, or perfumes.  You may wear deoderant.  Do not shave 48 hours prior to surgery.  Men may shave face and neck.  Do not bring valuables to the hospital.  [facility] is not responsible for any belongings or valuables.  Contacts, dentures or bridgework may not be worn into surgery.  Leave your suitcase in the car.  After surgery it may be brought to your room.  For patients admitted to the hospital, discharge time will be determined by your treatment team.  Patients discharged the day of surgery will not be allowed to drive home.    Special instructions:  [facility] - Preparing for Surgery  Before surgery, you can play an important role.  Because skin is not sterile, your skin needs to be as free of germs as possible.  You can reduce the number of germs on you skin by washing with CHG (chlorahexidine gluconate) soap before surgery.  CHG is an antiseptic cleaner which kills germs and bonds with the skin to continue killing germs even after washing.  Please DO NOT use if you have an allergy to CHG or antibacterial soaps.  If your skin becomes reddened/irritated stop  using the CHG and inform your nurse when you arrive at Short Stay.  Do not shave (including legs and underarms) for at least 48 hours prior to the first CHG shower.  You may shave your face.  Please follow these instructions carefully:   1.  Shower with CHG Soap the night before surgery and the     morning of Surgery.  2.  If you choose to wash your hair, wash your hair first as usual with your  normal shampoo.  3.  After you shampoo, rinse your hair and body thoroughly to remove the Shampoo.  4.  Use CHG as you would any other liquid soap.  You can apply chg directly to the skin and wash gently with scrungie or a clean washcloth.  5.  Apply the CHG Soap to your body ONLY FROM THE NECK DOWN.   Do not use on open wounds or open sores.  Avoid contact with your eyes,ears, mouth and genitals (private parts).  Wash genitals (private parts)  with your normal soap.  6.  Wash thoroughly, paying special attention to the area where your surgery will be performed.  7.  Thoroughly rinse your body with warm water from the neck down.  8.  DO NOT shower/wash with your normal soap after using and rinsing off   the CHG Soap.  9.  Pat yourself dry with a clean towel.            10.  Wear  clean pajamas.            11.  Place clean sheets on your bed the night of your first shower and do not  sleep with pets.  Day of Surgery  Do not apply any lotions/deoderants the morning of surgery.  Please wear clean clothes to the hospital/surgery center.     Please read over the following fact sheets that you were given. Pain Booklet, Blood Transfusion Information, MRSA Information and Surgical Site Infection Prevention

## 2016-03-06 NOTE — Progress Notes (Signed)
PCP is Eldridge Abrahams, PA at [facility] Denies ever seeing a cardiologist. Denies ever having a card cath, Stress test, Echo. Pt states Dr Donnetta Hutching will be assisting Dr Rolena Infante.  Spoke with Arbie Cookey at Dr Luther Parody office and informed her of no orders, states she will place them.

## 2016-03-06 NOTE — Progress Notes (Signed)
Anesthesia Chart Review:  Pt is a 35 year old female scheduled for L4-5 anterior lumbar fusion, abdominal exposure on 03/13/2016 with Melina Schools, MD and Curt Jews, MD.   PMH includes:  ADHD, post-op N/V, GERD. Former smoker. BMI 40. Lumbar discectomy 10/13/14.   Medications include: prilosec  Preoperative labs reviewed.    If no changes, I anticipate pt can proceed with surgery as scheduled.   Willeen Cass, FNP-BC [facility] Short Stay Surgical Center/Anesthesiology Phone: [phone] 03/06/2016 4:30 PM

## 2016-03-12 MED ORDER — CEFAZOLIN SODIUM-DEXTROSE 2-4 GM/100ML-% IV SOLN
2.0000 g | INTRAVENOUS | Status: AC
  Administered 2016-03-13: 2 g via INTRAVENOUS
  Filled 2016-03-12: qty 100

## 2016-03-13 ENCOUNTER — Encounter (HOSPITAL_COMMUNITY)
Admission: RE | Disposition: A | Discharge status: Home / Self Care | Payer: Self-pay | Source: Ambulatory Visit | Attending: Orthopedic Surgery

## 2016-03-13 ENCOUNTER — Inpatient Hospital Stay (HOSPITAL_COMMUNITY): Payer: Worker's Compensation | Admitting: Emergency Medicine

## 2016-03-13 ENCOUNTER — Inpatient Hospital Stay (HOSPITAL_COMMUNITY): Payer: Worker's Compensation

## 2016-03-13 ENCOUNTER — Inpatient Hospital Stay (HOSPITAL_COMMUNITY)
Admission: RE | Admit: 2016-03-13 | Discharge: 2016-03-16 | DRG: 460 | Disposition: A | Discharge status: Home / Self Care | Payer: Worker's Compensation | Source: Ambulatory Visit | Attending: Orthopedic Surgery | Admitting: Orthopedic Surgery

## 2016-03-13 ENCOUNTER — Encounter (HOSPITAL_COMMUNITY): Payer: Self-pay | Admitting: Anesthesiology

## 2016-03-13 ENCOUNTER — Inpatient Hospital Stay (HOSPITAL_COMMUNITY): Payer: Worker's Compensation | Admitting: Anesthesiology

## 2016-03-13 DIAGNOSIS — Z79899 Other long term (current) drug therapy: Secondary | ICD-10-CM | POA: Diagnosis not present

## 2016-03-13 DIAGNOSIS — K219 Gastro-esophageal reflux disease without esophagitis: Secondary | ICD-10-CM | POA: Diagnosis present

## 2016-03-13 DIAGNOSIS — M79604 Pain in right leg: Secondary | ICD-10-CM | POA: Diagnosis present

## 2016-03-13 DIAGNOSIS — Z87891 Personal history of nicotine dependence: Secondary | ICD-10-CM | POA: Diagnosis not present

## 2016-03-13 DIAGNOSIS — M79605 Pain in left leg: Secondary | ICD-10-CM | POA: Diagnosis present

## 2016-03-13 DIAGNOSIS — K5900 Constipation, unspecified: Secondary | ICD-10-CM | POA: Diagnosis present

## 2016-03-13 DIAGNOSIS — M549 Dorsalgia, unspecified: Secondary | ICD-10-CM | POA: Diagnosis present

## 2016-03-13 DIAGNOSIS — Z981 Arthrodesis status: Secondary | ICD-10-CM

## 2016-03-13 DIAGNOSIS — R32 Unspecified urinary incontinence: Secondary | ICD-10-CM | POA: Diagnosis present

## 2016-03-13 DIAGNOSIS — Z885 Allergy status to narcotic agent status: Secondary | ICD-10-CM

## 2016-03-13 DIAGNOSIS — M5136 Other intervertebral disc degeneration, lumbar region: Secondary | ICD-10-CM | POA: Diagnosis present

## 2016-03-13 DIAGNOSIS — Z6841 Body Mass Index (BMI) 40.0 and over, adult: Secondary | ICD-10-CM | POA: Diagnosis not present

## 2016-03-13 DIAGNOSIS — Z419 Encounter for procedure for purposes other than remedying health state, unspecified: Secondary | ICD-10-CM

## 2016-03-13 HISTORY — PX: ANTERIOR LUMBAR FUSION: SHX1170

## 2016-03-13 HISTORY — PX: ABDOMINAL EXPOSURE: SHX5708

## 2016-03-13 SURGERY — ANTERIOR LUMBAR FUSION 1 LEVEL
Anesthesia: General

## 2016-03-13 MED ORDER — SODIUM CHLORIDE 0.9% FLUSH
3.0000 mL | Freq: Two times a day (BID) | INTRAVENOUS | Status: DC
  Administered 2016-03-14 – 2016-03-15 (×3): 3 mL via INTRAVENOUS

## 2016-03-13 MED ORDER — OXYCODONE HCL 5 MG PO TABS
10.0000 mg | ORAL_TABLET | ORAL | Status: DC | PRN
Start: 2016-03-13 — End: 2016-03-16
  Administered 2016-03-13 – 2016-03-16 (×19): 10 mg via ORAL
  Filled 2016-03-13 (×18): qty 2

## 2016-03-13 MED ORDER — ROCURONIUM BROMIDE 100 MG/10ML IV SOLN
INTRAVENOUS | Status: DC | PRN
  Administered 2016-03-13: 50 mg via INTRAVENOUS

## 2016-03-13 MED ORDER — OXYCODONE HCL 5 MG PO TABS
ORAL_TABLET | ORAL | Status: AC
  Administered 2016-03-13: 10 mg via ORAL
  Filled 2016-03-13: qty 2

## 2016-03-13 MED ORDER — SUGAMMADEX SODIUM 200 MG/2ML IV SOLN
INTRAVENOUS | Status: DC | PRN
  Administered 2016-03-13: 200 mg via INTRAVENOUS

## 2016-03-13 MED ORDER — ROCURONIUM BROMIDE 50 MG/5ML IV SOLN
INTRAVENOUS | Status: AC
  Filled 2016-03-13: qty 1

## 2016-03-13 MED ORDER — FENTANYL CITRATE (PF) 250 MCG/5ML IJ SOLN
INTRAMUSCULAR | Status: AC
  Filled 2016-03-13: qty 5

## 2016-03-13 MED ORDER — THROMBIN 20000 UNITS EX SOLR
CUTANEOUS | Status: AC
  Filled 2016-03-13: qty 20000

## 2016-03-13 MED ORDER — PROPOFOL 500 MG/50ML IV EMUL
INTRAVENOUS | Status: DC | PRN
  Administered 2016-03-13: 25 ug/kg/min via INTRAVENOUS

## 2016-03-13 MED ORDER — HYDROMORPHONE HCL 1 MG/ML IJ SOLN
0.2500 mg | INTRAMUSCULAR | Status: DC | PRN
  Administered 2016-03-13 (×4): 0.5 mg via INTRAVENOUS

## 2016-03-13 MED ORDER — HYDROMORPHONE HCL 1 MG/ML IJ SOLN
INTRAMUSCULAR | Status: AC
Start: 2016-03-13 — End: 2016-03-13
  Administered 2016-03-13: 0.5 mg via INTRAVENOUS
  Filled 2016-03-13: qty 1

## 2016-03-13 MED ORDER — SODIUM CHLORIDE 0.9% FLUSH: 3.0000 mL | INTRAVENOUS | Status: DC | PRN

## 2016-03-13 MED ORDER — LACTATED RINGERS IV SOLN: INTRAVENOUS | Status: DC

## 2016-03-13 MED ORDER — ACETAMINOPHEN 10 MG/ML IV SOLN
INTRAVENOUS | Status: AC
  Filled 2016-03-13: qty 100

## 2016-03-13 MED ORDER — PROPOFOL 10 MG/ML IV BOLUS
INTRAVENOUS | Status: AC
  Filled 2016-03-13: qty 20

## 2016-03-13 MED ORDER — DEXAMETHASONE 4 MG PO TABS
4.0000 mg | ORAL_TABLET | Freq: Four times a day (QID) | ORAL | Status: AC
  Administered 2016-03-13 – 2016-03-14 (×3): 4 mg via ORAL
  Filled 2016-03-13 (×3): qty 1

## 2016-03-13 MED ORDER — LUBIPROSTONE 24 MCG PO CAPS
24.0000 ug | ORAL_CAPSULE | Freq: Two times a day (BID) | ORAL | Status: DC
  Administered 2016-03-13 – 2016-03-16 (×6): 24 ug via ORAL
  Filled 2016-03-13 (×6): qty 1

## 2016-03-13 MED ORDER — LACTATED RINGERS IV SOLN
INTRAVENOUS | Status: DC | PRN
  Administered 2016-03-13: 09:00:00 via INTRAVENOUS

## 2016-03-13 MED ORDER — DEXAMETHASONE SODIUM PHOSPHATE 10 MG/ML IJ SOLN
INTRAMUSCULAR | Status: AC
  Filled 2016-03-13: qty 1

## 2016-03-13 MED ORDER — DEXAMETHASONE SODIUM PHOSPHATE 10 MG/ML IJ SOLN
INTRAMUSCULAR | Status: DC | PRN
  Administered 2016-03-13: 10 mg via INTRAVENOUS

## 2016-03-13 MED ORDER — VECURONIUM BROMIDE 10 MG IV SOLR
INTRAVENOUS | Status: DC | PRN
  Administered 2016-03-13: 2 mg via INTRAVENOUS
  Administered 2016-03-13: 4 mg via INTRAVENOUS

## 2016-03-13 MED ORDER — METHOCARBAMOL 1000 MG/10ML IJ SOLN
500.0000 mg | Freq: Four times a day (QID) | INTRAVENOUS | Status: DC | PRN
  Filled 2016-03-13: qty 5

## 2016-03-13 MED ORDER — LACTATED RINGERS IV SOLN
INTRAVENOUS | Status: DC | PRN
  Administered 2016-03-13 (×2): via INTRAVENOUS

## 2016-03-13 MED ORDER — MENTHOL 3 MG MT LOZG: 1.0000 | LOZENGE | OROMUCOSAL | Status: DC | PRN

## 2016-03-13 MED ORDER — 0.9 % SODIUM CHLORIDE (POUR BTL) OPTIME
TOPICAL | Status: DC | PRN
  Administered 2016-03-13 (×2): 1000 mL

## 2016-03-13 MED ORDER — DIPHENHYDRAMINE HCL 50 MG/ML IJ SOLN
INTRAMUSCULAR | Status: DC | PRN
  Administered 2016-03-13: 12.5 mg via INTRAVENOUS

## 2016-03-13 MED ORDER — THROMBIN 20000 UNITS EX SOLR
CUTANEOUS | Status: DC | PRN
  Administered 2016-03-13: 20 mL via TOPICAL

## 2016-03-13 MED ORDER — CEFAZOLIN IN D5W 1 GM/50ML IV SOLN
1.0000 g | Freq: Three times a day (TID) | INTRAVENOUS | Status: AC
  Administered 2016-03-13 (×2): 1 g via INTRAVENOUS
  Filled 2016-03-13 (×2): qty 50

## 2016-03-13 MED ORDER — MIDAZOLAM HCL 5 MG/5ML IJ SOLN
INTRAMUSCULAR | Status: DC | PRN
  Administered 2016-03-13: 2 mg via INTRAVENOUS

## 2016-03-13 MED ORDER — OXYCODONE-ACETAMINOPHEN 10-325 MG PO TABS: 1.0000 | ORAL_TABLET | ORAL | Status: DC | PRN

## 2016-03-13 MED ORDER — ONDANSETRON HCL 4 MG/2ML IJ SOLN
4.0000 mg | INTRAMUSCULAR | Status: DC | PRN
  Administered 2016-03-13 – 2016-03-14 (×3): 4 mg via INTRAVENOUS
  Filled 2016-03-13 (×3): qty 2

## 2016-03-13 MED ORDER — PANTOPRAZOLE SODIUM 40 MG PO TBEC
80.0000 mg | DELAYED_RELEASE_TABLET | Freq: Every day | ORAL | Status: DC
  Administered 2016-03-13 – 2016-03-16 (×4): 80 mg via ORAL
  Filled 2016-03-13 (×4): qty 2

## 2016-03-13 MED ORDER — LIDOCAINE HCL (CARDIAC) 20 MG/ML IV SOLN
INTRAVENOUS | Status: DC | PRN
  Administered 2016-03-13: 100 mg via INTRAVENOUS

## 2016-03-13 MED ORDER — SUCCINYLCHOLINE CHLORIDE 200 MG/10ML IV SOSY
PREFILLED_SYRINGE | INTRAVENOUS | Status: AC
  Filled 2016-03-13: qty 10

## 2016-03-13 MED ORDER — BUPIVACAINE-EPINEPHRINE (PF) 0.25% -1:200000 IJ SOLN
INTRAMUSCULAR | Status: AC
  Filled 2016-03-13: qty 30

## 2016-03-13 MED ORDER — METHOCARBAMOL 500 MG PO TABS
ORAL_TABLET | ORAL | Status: AC
  Filled 2016-03-13: qty 1

## 2016-03-13 MED ORDER — ENOXAPARIN SODIUM 40 MG/0.4ML ~~LOC~~ SOLN
40.0000 mg | SUBCUTANEOUS | Status: DC
  Administered 2016-03-14 – 2016-03-16 (×3): 40 mg via SUBCUTANEOUS
  Filled 2016-03-13 (×3): qty 0.4

## 2016-03-13 MED ORDER — DEXAMETHASONE SODIUM PHOSPHATE 4 MG/ML IJ SOLN
4.0000 mg | Freq: Four times a day (QID) | INTRAMUSCULAR | Status: AC
  Administered 2016-03-13: 4 mg via INTRAVENOUS
  Filled 2016-03-13: qty 1

## 2016-03-13 MED ORDER — DIPHENHYDRAMINE HCL 25 MG PO CAPS
25.0000 mg | ORAL_CAPSULE | ORAL | Status: DC | PRN
  Administered 2016-03-13 – 2016-03-16 (×13): 25 mg via ORAL
  Filled 2016-03-13 (×14): qty 1

## 2016-03-13 MED ORDER — ENOXAPARIN SODIUM 40 MG/0.4ML ~~LOC~~ SOLN: 40.0000 mg | SUBCUTANEOUS | Status: DC

## 2016-03-13 MED ORDER — ACETAMINOPHEN 10 MG/ML IV SOLN
INTRAVENOUS | Status: AC
  Administered 2016-03-13: 1000 mg via INTRAVENOUS
  Filled 2016-03-13: qty 100

## 2016-03-13 MED ORDER — ONDANSETRON HCL 4 MG PO TABS: 4.0000 mg | ORAL_TABLET | Freq: Three times a day (TID) | ORAL | Status: DC | PRN

## 2016-03-13 MED ORDER — ONDANSETRON HCL 4 MG/2ML IJ SOLN
INTRAMUSCULAR | Status: AC
  Filled 2016-03-13: qty 2

## 2016-03-13 MED ORDER — SCOPOLAMINE 1 MG/3DAYS TD PT72
MEDICATED_PATCH | TRANSDERMAL | Status: AC
  Administered 2016-03-13: 1.5 mg
  Filled 2016-03-13: qty 1

## 2016-03-13 MED ORDER — LIDOCAINE 2% (20 MG/ML) 5 ML SYRINGE
INTRAMUSCULAR | Status: AC
  Filled 2016-03-13: qty 5

## 2016-03-13 MED ORDER — METHOCARBAMOL 500 MG PO TABS: 500.0000 mg | ORAL_TABLET | Freq: Three times a day (TID) | ORAL | Status: DC | PRN

## 2016-03-13 MED ORDER — HYDROMORPHONE HCL 1 MG/ML IJ SOLN
INTRAMUSCULAR | Status: AC
  Administered 2016-03-13: 0.5 mg via INTRAVENOUS
  Filled 2016-03-13: qty 1

## 2016-03-13 MED ORDER — SUCCINYLCHOLINE CHLORIDE 20 MG/ML IJ SOLN
INTRAMUSCULAR | Status: DC | PRN
  Administered 2016-03-13: 120 mg via INTRAVENOUS

## 2016-03-13 MED ORDER — DIPHENHYDRAMINE HCL 50 MG/ML IJ SOLN
INTRAMUSCULAR | Status: AC
  Filled 2016-03-13: qty 1

## 2016-03-13 MED ORDER — FENTANYL CITRATE (PF) 100 MCG/2ML IJ SOLN
INTRAMUSCULAR | Status: DC | PRN
  Administered 2016-03-13: 100 ug via INTRAVENOUS
  Administered 2016-03-13: 150 ug via INTRAVENOUS
  Administered 2016-03-13 (×3): 50 ug via INTRAVENOUS
  Administered 2016-03-13: 100 ug via INTRAVENOUS

## 2016-03-13 MED ORDER — PROPOFOL 10 MG/ML IV BOLUS
INTRAVENOUS | Status: DC | PRN
  Administered 2016-03-13: 200 mg via INTRAVENOUS

## 2016-03-13 MED ORDER — PROMETHAZINE HCL 25 MG/ML IJ SOLN: 6.2500 mg | INTRAMUSCULAR | Status: DC | PRN

## 2016-03-13 MED ORDER — PHENOL 1.4 % MT LIQD
1.0000 | OROMUCOSAL | Status: DC | PRN
Start: 2016-03-13 — End: 2016-03-16

## 2016-03-13 MED ORDER — MIDAZOLAM HCL 2 MG/2ML IJ SOLN
INTRAMUSCULAR | Status: AC
  Filled 2016-03-13: qty 2

## 2016-03-13 MED ORDER — ONDANSETRON HCL 4 MG/2ML IJ SOLN
INTRAMUSCULAR | Status: DC | PRN
Start: 2016-03-13 — End: 2016-03-13
  Administered 2016-03-13 (×2): 4 mg via INTRAVENOUS

## 2016-03-13 MED ORDER — METHOCARBAMOL 500 MG PO TABS
500.0000 mg | ORAL_TABLET | Freq: Four times a day (QID) | ORAL | Status: DC | PRN
  Administered 2016-03-13: 500 mg via ORAL
  Filled 2016-03-13: qty 1

## 2016-03-13 MED ORDER — MORPHINE SULFATE (PF) 2 MG/ML IV SOLN
2.0000 mg | INTRAVENOUS | Status: DC | PRN
  Administered 2016-03-13 – 2016-03-15 (×9): 4 mg via INTRAVENOUS
  Filled 2016-03-13 (×9): qty 2

## 2016-03-13 MED ORDER — METHOCARBAMOL 750 MG PO TABS
750.0000 mg | ORAL_TABLET | Freq: Four times a day (QID) | ORAL | Status: DC | PRN
  Administered 2016-03-13 – 2016-03-16 (×11): 750 mg via ORAL
  Filled 2016-03-13 (×11): qty 1

## 2016-03-13 SURGICAL SUPPLY — 99 items
APPLIER CLIP 11 MED OPEN (CLIP) ×3
BLADE SURG 10 STRL SS (BLADE) ×3 IMPLANT
BLADE SURG ROTATE 9660 (MISCELLANEOUS) IMPLANT
BONE MATRIX VIVIGEN 5CC (Bone Implant) ×3 IMPLANT
CLIP APPLIE 11 MED OPEN (CLIP) ×1 IMPLANT
CLIP LIGATING EXTRA MED SLVR (CLIP) IMPLANT
CLIP LIGATING EXTRA SM BLUE (MISCELLANEOUS) IMPLANT
CLOSURE WOUND 1/2 X4 (GAUZE/BANDAGES/DRESSINGS) ×1
CORDS BIPOLAR (ELECTRODE) ×3 IMPLANT
COVER ASSEMBLY 16MM (Orthopedic Implant) ×3 IMPLANT
COVER SURGICAL LIGHT HANDLE (MISCELLANEOUS) ×3 IMPLANT
DERMABOND ADVANCED (GAUZE/BANDAGES/DRESSINGS) ×2
DERMABOND ADVANCED .7 DNX12 (GAUZE/BANDAGES/DRESSINGS) ×1 IMPLANT
DRAPE C-ARM 42X72 X-RAY (DRAPES) ×3 IMPLANT
DRAPE INCISE IOBAN 66X45 STRL (DRAPES) IMPLANT
DRAPE POUCH INSTRU U-SHP 10X18 (DRAPES) ×3 IMPLANT
DRAPE SURG 17X23 STRL (DRAPES) ×3 IMPLANT
DRAPE U-SHAPE 47X51 STRL (DRAPES) ×3 IMPLANT
DRSG AQUACEL AG ADV 3.5X 6 (GAUZE/BANDAGES/DRESSINGS) ×3 IMPLANT
DRSG MEPILEX BORDER 4X8 (GAUZE/BANDAGES/DRESSINGS) ×3 IMPLANT
DURAPREP 26ML APPLICATOR (WOUND CARE) ×3 IMPLANT
ELECT BLADE 4.0 EZ CLEAN MEGAD (MISCELLANEOUS) ×3
ELECT CAUTERY BLADE 6.4 (BLADE) ×3 IMPLANT
ELECT PENCIL ROCKER SW 15FT (MISCELLANEOUS) ×3 IMPLANT
ELECT REM PT RETURN 9FT ADLT (ELECTROSURGICAL) ×3
ELECTRODE BLDE 4.0 EZ CLN MEGD (MISCELLANEOUS) ×1 IMPLANT
ELECTRODE REM PT RTRN 9FT ADLT (ELECTROSURGICAL) ×1 IMPLANT
GAUZE SPONGE 4X4 16PLY XRAY LF (GAUZE/BANDAGES/DRESSINGS) IMPLANT
GLOVE BIO SURGEON STRL SZ 6.5 (GLOVE) ×2 IMPLANT
GLOVE BIO SURGEON STRL SZ7.5 (GLOVE) IMPLANT
GLOVE BIO SURGEONS STRL SZ 6.5 (GLOVE) ×1
GLOVE BIOGEL PI IND STRL 6.5 (GLOVE) ×1 IMPLANT
GLOVE BIOGEL PI IND STRL 8.5 (GLOVE) ×2 IMPLANT
GLOVE BIOGEL PI INDICATOR 6.5 (GLOVE) ×2
GLOVE BIOGEL PI INDICATOR 8.5 (GLOVE) ×4
GLOVE SS BIOGEL STRL SZ 7.5 (GLOVE) ×1 IMPLANT
GLOVE SS BIOGEL STRL SZ 8.5 (GLOVE) ×2 IMPLANT
GLOVE SUPERSENSE BIOGEL SZ 7.5 (GLOVE) ×2
GLOVE SUPERSENSE BIOGEL SZ 8.5 (GLOVE) ×4
GOWN STRL REUS W/ TWL LRG LVL3 (GOWN DISPOSABLE) ×3 IMPLANT
GOWN STRL REUS W/TWL 2XL LVL3 (GOWN DISPOSABLE) ×9 IMPLANT
GOWN STRL REUS W/TWL LRG LVL3 (GOWN DISPOSABLE) ×6
HEMOSTAT SNOW SURGICEL 2X4 (HEMOSTASIS) IMPLANT
INSERT FOGARTY 61MM (MISCELLANEOUS) IMPLANT
INSERT FOGARTY SM (MISCELLANEOUS) IMPLANT
INTERPLATE 39X16X8 (Plate) ×3 IMPLANT
KIT BASIN OR (CUSTOM PROCEDURE TRAY) ×3 IMPLANT
KIT ROOM TURNOVER OR (KITS) ×6 IMPLANT
LOOP VESSEL MAXI BLUE (MISCELLANEOUS) IMPLANT
LOOP VESSEL MINI RED (MISCELLANEOUS) IMPLANT
NEEDLE SPNL 18GX3.5 QUINCKE PK (NEEDLE) ×3 IMPLANT
NS IRRIG 1000ML POUR BTL (IV SOLUTION) ×9 IMPLANT
PACK LAMINECTOMY ORTHO (CUSTOM PROCEDURE TRAY) ×3 IMPLANT
PACK UNIVERSAL I (CUSTOM PROCEDURE TRAY) ×3 IMPLANT
PAD ARMBOARD 7.5X6 YLW CONV (MISCELLANEOUS) ×12 IMPLANT
PEEK SPACER INTERPLAT 35X16X8 (Peek) ×3 IMPLANT
PLATE SPINAL INTERPLAT 39X16X8 (Plate) ×1 IMPLANT
PUTTY BONE DBX 5CC MIX (Putty) ×3 IMPLANT
SCREW BONE RESCUE (Screw) ×9 IMPLANT
SCREW BONE STANDARD (Screw) ×3 IMPLANT
SCREW BONE STD (Screw) ×1 IMPLANT
SPONGE INTESTINAL PEANUT (DISPOSABLE) ×12 IMPLANT
SPONGE LAP 18X18 X RAY DECT (DISPOSABLE) IMPLANT
SPONGE LAP 4X18 X RAY DECT (DISPOSABLE) IMPLANT
SPONGE SURGIFOAM ABS GEL 100 (HEMOSTASIS) IMPLANT
STAPLER VISISTAT 35W (STAPLE) IMPLANT
STRIP CLOSURE SKIN 1/2X4 (GAUZE/BANDAGES/DRESSINGS) ×2 IMPLANT
SURGIFLO W/THROMBIN 8M KIT (HEMOSTASIS) IMPLANT
SUT BONE WAX W31G (SUTURE) ×3 IMPLANT
SUT MNCRL AB 4-0 PS2 18 (SUTURE) ×3 IMPLANT
SUT MON AB 3-0 SH 27 (SUTURE) ×2
SUT MON AB 3-0 SH27 (SUTURE) ×1 IMPLANT
SUT PDS AB 1 CTX 36 (SUTURE) ×3 IMPLANT
SUT PROLENE 4 0 RB 1 (SUTURE) ×8
SUT PROLENE 4-0 RB1 .5 CRCL 36 (SUTURE) ×4 IMPLANT
SUT PROLENE 5 0 C 1 24 (SUTURE) IMPLANT
SUT PROLENE 5 0 CC1 (SUTURE) IMPLANT
SUT PROLENE 6 0 C 1 30 (SUTURE) ×3 IMPLANT
SUT PROLENE 6 0 CC (SUTURE) IMPLANT
SUT SILK 0 TIES 10X30 (SUTURE) ×3 IMPLANT
SUT SILK 2 0 TIES 10X30 (SUTURE) ×6 IMPLANT
SUT SILK 2 0SH CR/8 30 (SUTURE) IMPLANT
SUT SILK 3 0 TIES 10X30 (SUTURE) ×6 IMPLANT
SUT SILK 3 0SH CR/8 30 (SUTURE) IMPLANT
SUT STRATAFIX 1PDS 45CM VIOLET (SUTURE) ×3 IMPLANT
SUT VIC AB 0 CT1 27 (SUTURE) ×2
SUT VIC AB 0 CT1 27XBRD ANBCTR (SUTURE) ×1 IMPLANT
SUT VIC AB 1 CT1 27 (SUTURE) ×4
SUT VIC AB 1 CT1 27XBRD ANBCTR (SUTURE) ×2 IMPLANT
SUT VIC AB 2-0 CT1 18 (SUTURE) ×3 IMPLANT
SUT VIC AB 2-0 CT1 36 (SUTURE) ×3 IMPLANT
SUT VIC AB 3-0 SH 27 (SUTURE) ×2
SUT VIC AB 3-0 SH 27X BRD (SUTURE) ×1 IMPLANT
SYR BULB IRRIGATION 50ML (SYRINGE) ×3 IMPLANT
TOWEL OR 17X24 6PK STRL BLUE (TOWEL DISPOSABLE) ×3 IMPLANT
TOWEL OR 17X26 10 PK STRL BLUE (TOWEL DISPOSABLE) ×3 IMPLANT
TRAP SPECIMEN MUCOUS 40CC (MISCELLANEOUS) IMPLANT
TRAY FOLEY CATH 16FR SILVER (SET/KITS/TRAYS/PACK) ×3 IMPLANT
WATER STERILE IRR 1000ML POUR (IV SOLUTION) ×3 IMPLANT

## 2016-03-13 NOTE — H&P (Addendum)
     WRONG NOTE / PATIENT

## 2016-03-13 NOTE — Anesthesia Preprocedure Evaluation (Addendum)
Anesthesia Evaluation  Patient identified by MRN, date of birth, ID band Patient awake    Reviewed: Allergy & Precautions, NPO status , Patient's Chart, lab work & pertinent test results  History of Anesthesia Complications (+) PONV and history of anesthetic complications  Airway Mallampati: II  TM Distance: >3 FB Neck ROM: Full    Dental no notable dental hx. (+) Teeth Intact, Dental Advisory Given   Pulmonary neg pulmonary ROS, former smoker,    Pulmonary exam normal breath sounds clear to auscultation       Cardiovascular negative cardio ROS Normal cardiovascular exam Rhythm:Regular Rate:Normal     Neuro/Psych PSYCHIATRIC DISORDERS Anxiety Depression negative neurological ROS     GI/Hepatic Neg liver ROS, GERD  Medicated and Poorly Controlled,  Endo/Other  Morbid obesity  Renal/GU negative Renal ROS  negative genitourinary   Musculoskeletal negative musculoskeletal ROS (+)   Abdominal   Peds negative pediatric ROS (+)  Hematology negative hematology ROS (+)   Anesthesia Other Findings   Reproductive/Obstetrics negative OB ROS                            Anesthesia Physical Anesthesia Plan  ASA: II  Anesthesia Plan: General   Post-op Pain Management:    Induction: Intravenous  Airway Management Planned: Oral ETT  Additional Equipment:   Intra-op Plan:   Post-operative Plan: Extubation in OR  Informed Consent: I have reviewed the patients History and Physical, chart, labs and discussed the procedure including the risks, benefits and alternatives for the proposed anesthesia with the patient or authorized representative who has indicated his/her understanding and acceptance.   Dental advisory given  Plan Discussed with: CRNA and Surgeon  Anesthesia Plan Comments:         Anesthesia Quick Evaluation

## 2016-03-13 NOTE — Anesthesia Postprocedure Evaluation (Signed)
Anesthesia Post Note  Patient: Stacy Deleon  Procedure(s) Performed: Procedure(s) (LRB): ANTERIOR LUMBAR FUSION L4-L5    (1 LEVEL)  ALIF (N/A) ABDOMINAL EXPOSURE (N/A)  Patient location during evaluation: PACU Anesthesia Type: General Level of consciousness: sedated and patient cooperative Pain management: pain level controlled Vital Signs Assessment: post-procedure vital signs reviewed and stable Respiratory status: spontaneous breathing Cardiovascular status: stable Anesthetic complications: no    Last Vitals:  Filed Vitals:   03/13/16 1242 03/13/16 1303  BP: 107/66 122/73  Pulse: 83 81  Temp: 36.8 C 36.7 C  Resp: 13 16    Last Pain:  Filed Vitals:   03/13/16 1439  PainSc: Asleep                 Nolon Nations

## 2016-03-13 NOTE — OR Nursing (Signed)
Dr. Camila Li radiologist, called and reported that the xray showed only implants.  No retained instruments.

## 2016-03-13 NOTE — Op Note (Signed)
OPERATIVE REPORT  DATE OF SURGERY: 03/13/2016  PATIENT: Stacy Deleon, 35 y.o. female MRN: [id number]  DOB: May 31, 1981  PRE-OPERATIVE DIAGNOSIS: Degenerative disc disease L4-5  POST-OPERATIVE DIAGNOSIS:  Same  PROCEDURE: Anterior exposure for degenerative disc disease L4-5  SURGEON:  Gretta Began, M.D. co-surgeon for the exposure Dr. Shon Baton  Assistant [place], [place]  ANESTHESIA:  Gen.  EBL: Minimal ml  Total I/O In: 2240 [P.O.:240; I.V.:2000] Out: 1000 [Urine:900; Blood:100]  BLOOD ADMINISTERED: None  DRAINS: None  SPECIMEN: None  COUNTS CORRECT:  YES  PLAN OF CARE: PACU   PATIENT DISPOSITION:  PACU - hemodynamically stable  PROCEDURE DETAILS: Patient was taken to the operating room placed above the area the abdomen was prepped and draped in sterile fashion. Using a lateral C-arm projection the level of the L4-5 disc was marked on the skin. Incision was made from the midline left lateral at this level. This was carried down through the subcutaneous fat to the level of the fascia. The anterior rectus sheath was opened in line with the skin incision. The rectus muscle was mobilized circumferentially. The retro-peritoneal space was entered and the left lower quadrant bluntly and the intracranial contents were mobilized to the right. The posterior rectus sheath was opened laterally sharply. The peritoneal cavity was not entered. The left ureter and the intracranial contents were mobilized to the right. Lumbar artery and vein between the L L3 and L4 disc were ligated and divided the left of the vertebral body. Blunt dissection was continued to the level of the ileo-lumbar vein. There were 2 large iliolumbar veins. These were ligated with 2-0 silk ties and divided. This gave excellent mobilization for the exposure. Blunt dissection was continued over the 45 disc. The Thompson retractor was brought onto the field. The reverse lip 150 blades were positioned to the right and left  of the L4-5 disc. The malleable 140 disc blades were positioned to the inferior and superior retraction. A needle was placed in the L4-5 disc and C-arm returned and confirm this was the appropriate level. The remainder the procedure will be dictated as a separate note with Dr. Serina Cowper, M.D. 03/13/2016 5:31 PM

## 2016-03-13 NOTE — OR Nursing (Signed)
Dr. Rolena Infante notified of 1 hour retractor time.at 1100.

## 2016-03-13 NOTE — Progress Notes (Signed)
PT Cancellation Note  Patient Details Name: Stacy Deleon MRN: [id number] DOB: 1981/01/12   Cancelled Treatment:    Reason Eval/Treat Not Completed: Other (comment) (Pt agitated.  ).  Will return tomorrow, 03/14/16, for PT evaluation.    Collie Siad PT, DPT  Pager: [phone] Phone: [phone] 03/13/2016, 3:50 PM

## 2016-03-13 NOTE — H&P (Signed)
History of Present Illness  The patient is a 35 year old female who presents for a Follow-up for Follow-up back. The patient is being followed for their she returns to discuss her Lumbar MRI scan that was done at [facility]. They are now 8 month(s) out from injury (DOI: 07/22/15 "I was lifting a storage unit door and felt a pop in my back"). Symptoms reported today include: pain (has a catch in the back and can not stand up), pain at night, aching, throbbing, stiffness, catching, pain with weightbearing, difficulty ambulating, difficulty arising from chair, pain with overhead motions, numbness, burning, urinary incontinence, leg pain (right inner thigh and leg to the foot, tingling), pain with lifting, pain with sitting and pain with standing, while the patient does not report symptoms of: weakness. The patient states that they are doing poorly. Current treatment includes: activity modification and pain medications. The following medication has been used for pain control: Norco (5/325 Rx'd by Dr Rolena Infante "I have been taking one to 1 1/2 at a time) and Robaxin, Lyrica (She is requesting a refill on the Robaxin and Lyrica). The patient reports their current pain level to be 7/10 / 10. The patient has reported improvement of their symptoms with: activity modification, conservative measures, Cortisone injections and physical therapy.  Problem List/Past Medical  S/P lumbar discectomy [id number])  Problems Reconciled   Allergies Hydrocodone-Acetaminophen *ANALGESICS - OPIOID*  Itching. Allergies Reconciled   Family History Congestive Heart Failure  grandmother mothers side Heart Disease  father and grandmother fathers side Depression  First Degree Relatives. mother, father and sister Heart disease in female family member before age 4  Cerebrovascular Accident  grandmother mothers side Hypertension  father, grandmother mothers side and grandmother fathers side Kidney disease  grandmother mothers  side Rheumatoid Arthritis  father and grandmother mothers side First Degree Relatives  reported No pertinent family history   Social History  Tobacco use  Former smoker. 05/31/2014: smoke(d) 1 pack(s) per day former smoker; smoke(d) 1 pack(s) per day Not under pain contract  Tobacco / smoke exposure  05/31/2014: yes outdoors only yes outdoors only Drug/Alcohol Rehab (Previously)  no Illicit drug use  no Drug/Alcohol Rehab (Currently)  no Pain Contract  no Alcohol use  current drinker; drinks beer, wine and hard liquor; only occasionally per week Children  3 Current drinker  05/31/2014: Currently drinks beer, wine and hard liquor only occasionally per week Current work status  working full time Exercise  Exercises weekly; does running / walking Living situation  live with spouse Marital status  divorced partnered No history of drug/alcohol rehab  Number of flights of stairs before winded  2-3 greater than 5  Medication History  Norco (5-325MG  Tablet, 1 (one) Oral PO TID, Taken starting 02/29/2016) Active. Robaxin (500MG  Tablet, Oral) Active. (tid) Lyrica (75MG  Capsule, Oral) Active. (tid) Zofran (4MG  Tablet, Oral) Active. (prn) Medications Reconciled  Physical Exam General General Appearance-Not in acute distress. Orientation-Oriented X3. Build & Nutrition-Well nourished and Well developed.  Integumentary General Characteristics Surgical Scars - surgical scarring consistent with previous lumbar surgery. Lumbar Spine-Skin examination of the lumbar spine is without deformity, skin lesions, lacerations or abrasions.  Abdomen Palpation/Percussion Palpation and Percussion of the abdomen reveal - Soft, Non Tender and No Rebound tenderness.  Peripheral Vascular Lower Extremity Palpation - Posterior tibial pulse - Bilateral - 2+. Dorsalis pedis pulse - Bilateral - 2+.  Neurologic Sensation Lower Extremity - Bilateral - sensation is intact in  the lower extremity. Reflexes Patellar Reflex - Bilateral -  2+. Achilles Reflex - Bilateral - 2+. Clonus - Bilateral - clonus not present. Hoffman's Sign - Bilateral - Hoffman's sign not present. Testing Seated Straight Leg Raise - Bilateral - Seated straight leg raise positive.  Musculoskeletal Spine/Ribs/Pelvis  Lumbosacral Spine: Inspection and Palpation - Tenderness - left lumbar paraspinals tender to palpation and right lumbar paraspinals tender to palpation. Strength and Tone: Strength - Hip Flexion - Bilateral - 5/5. Knee Extension - Bilateral - 5/5. Knee Flexion - Bilateral - 5/5. Ankle Dorsiflexion - Bilateral - 5/5. Ankle Plantarflexion - Bilateral - 5/5. Heel-Toe Walk - Bilateral - able to heel-toe walk without difficulty. ROM - Flexion - moderately decreased range of motion and painful. Extension - moderately decreased range of motion and painful. Left Lateral Bending - moderately decreased range of motion and painful. Right Lateral Bending - moderately decreased range of motion and painful. Right Rotation - moderately decreased range of motion and painful. Left Rotation - moderately decreased range of motion and painful. Pain - . Lumbosacral Spine - Waddell's Signs - no Waddell's signs present. Lower Extremity Range of Motion - No true hip, knee or ankle pain with range of motion. Gait and Station - [employer] - no assistive devices.   Plan She returns today for followup. Over the weekend, she started having a spasm in the back radiating down the right lower extremity, which is quite severe. She states that it is causing her to walk in an altered fashion. Clinically, she has horrific back and buttock pain, which is her baseline. She has no radicular left leg pain. She does have neuropathic right leg pain. Compartments are soft and nontender. At this point, since we do finally have approval from her work comp carrier to move forward with the fusion surgery.   There were no  additional abnormalities noted on this new MRI. Dr. Rolena Infante after discussion with the patient is considering moving forward with the patient the surgery that he had previously planned on. MRI was significant for 3-mm disc protrusion at L3-4 root without mass effect, L4-5 is the primary level where there is a central and to the left paracentral disc extrusion extending caudally 6 mm in thickness, L5-S1 small central disc protrusion. The patient will follow up two weeks postop.

## 2016-03-13 NOTE — Anesthesia Procedure Notes (Signed)
Procedure Name: Intubation Date/Time: 03/13/2016 8:41 AM Performed by: Jenne Campus Pre-anesthesia Checklist: Patient identified, Emergency Drugs available, Suction available and Patient being monitored Patient Re-evaluated:Patient Re-evaluated prior to inductionOxygen Delivery Method: Circle System Utilized Preoxygenation: Pre-oxygenation with 100% oxygen Intubation Type: IV induction, Rapid sequence and Cricoid Pressure applied Ventilation: Mask ventilation without difficulty Laryngoscope Size: Miller and 2 Grade View: Grade I Tube type: Oral Tube size: 7.0 mm Number of attempts: 1 Airway Equipment and Method: Stylet and Oral airway Placement Confirmation: ETT inserted through vocal cords under direct vision,  positive ETCO2 and breath sounds checked- equal and bilateral Secured at: 21 cm Tube secured with: Tape Dental Injury: Teeth and Oropharynx as per pre-operative assessment  Comments: RSI done due to patient's poorly controlled GERD.

## 2016-03-13 NOTE — Brief Op Note (Signed)
03/13/2016  11:41 AM  PATIENT:  [name]  35 y.o. female  PRE-OPERATIVE DIAGNOSIS:  DDD WITH RECURRENT HNP   POST-OPERATIVE DIAGNOSIS:  DDD WITH RECURRENT HNP   PROCEDURE:  Procedure(s): ANTERIOR LUMBAR FUSION L4-L5    (1 LEVEL)  ALIF (N/A) ABDOMINAL EXPOSURE (N/A)  SURGEON:  Surgeon(s) and Role: Panel 1:    * Melina Schools, MD - Primary  Panel 2:    * Rosetta Posner, MD - Primary  PHYSICIAN ASSISTANT:   ASSISTANTS: Carmen Mayo   ANESTHESIA:   general  EBL:  Total I/O In: 2100 [I.V.:2100] Out: 350 [Urine:300; Blood:50]  BLOOD ADMINISTERED:none  DRAINS: none   LOCAL MEDICATIONS USED:  NONE  SPECIMEN:  No Specimen  DISPOSITION OF SPECIMEN:  N/A  COUNTS:  YES  TOURNIQUET:  * No tourniquets in log *  DICTATION: .Other Dictation: Dictation Number [id number]  PLAN OF CARE: Admit to inpatient   PATIENT DISPOSITION:  PACU - hemodynamically stable.

## 2016-03-13 NOTE — Transfer of Care (Signed)
Immediate Anesthesia Transfer of Care Note  Patient: Stacy Deleon  Procedure(s) Performed: Procedure(s): ANTERIOR LUMBAR FUSION L4-L5    (1 LEVEL)  ALIF (N/A) ABDOMINAL EXPOSURE (N/A)  Patient Location: PACU  Anesthesia Type:General  Level of Consciousness: awake, oriented and patient cooperative  Airway & Oxygen Therapy: Patient Spontanous Breathing and Patient connected to face mask oxygen  Post-op Assessment: Report given to RN and Post -op Vital signs reviewed and stable  Post vital signs: Reviewed  Last Vitals:  Filed Vitals:   03/13/16 0710 03/13/16 1153  BP: 130/69   Pulse: 82   Temp: 36.9 C 36.5 C  Resp: 18     Last Pain:  Filed Vitals:   03/13/16 1158  PainSc: 4          Complications: No apparent anesthesia complications

## 2016-03-14 ENCOUNTER — Inpatient Hospital Stay (HOSPITAL_COMMUNITY): Payer: Worker's Compensation

## 2016-03-14 ENCOUNTER — Encounter (HOSPITAL_COMMUNITY): Payer: Self-pay | Admitting: Orthopedic Surgery

## 2016-03-14 DIAGNOSIS — M79605 Pain in left leg: Secondary | ICD-10-CM

## 2016-03-14 MED ORDER — PREGABALIN 75 MG PO CAPS
75.0000 mg | ORAL_CAPSULE | Freq: Two times a day (BID) | ORAL | Status: DC
  Administered 2016-03-14 – 2016-03-16 (×5): 75 mg via ORAL
  Filled 2016-03-14 (×5): qty 1

## 2016-03-14 MED ORDER — MAGNESIUM CITRATE PO SOLN
1.0000 | Freq: Once | ORAL | Status: AC
  Administered 2016-03-14: 1 via ORAL
  Filled 2016-03-14: qty 296

## 2016-03-14 NOTE — Care Management Note (Signed)
Case Management Note  Patient Details  Name: LATARRA MAHE MRN: [id number] Date of Birth: 03-Apr-1981  Subjective/Objective:  58 yr. Old female s/p L4-L5 lumbar fusion.                   Action/Plan:  Case manager spoke with patient concerning DME needs. Patient is under worker's comp. CM placed call to her field Case Manager for [place] [phone]. Will fax order for tub bench when he calls with fax #.   Expected Discharge Date:   03/15/16               Expected Discharge Plan:  Home/Self Care  In-House Referral:     Discharge planning Services  CM Consult  Post Acute Care Choice:  Durable Medical Equipment Choice offered to:  NA (patient is under worker's comp, they will arrange for DME)  DME Arranged:  Tub bench DME Agency:   (TBD by worker's comp)  HH Arranged:    HH Agency:  NA  Status of Service:  In process, will continue to follow  If discussed at Long Length of Stay Meetings, dates discussed:    Additional Comments:  Ninfa Meeker, RN 03/14/2016, 1:46 PM

## 2016-03-14 NOTE — Evaluation (Signed)
Physical Therapy Evaluation Patient Details Name: Stacy Deleon MRN: [id number] DOB: Mar 07, 1981 Today's Date: 03/14/2016   History of Present Illness  35 y.o. female s/p L4-5 ALIF w/ abdominal exposure. PMH significant for GERD, anxiety, depression, and obesity.  Clinical Impression  Patient demonstrates deficits in functional mobility as indicated below. Will need continued skilled PT to address deficits and maximize function. Will see as indicated and progress as tolerated. May need outpatient PT services eventually to address neural deficits KV:[id number] foot)    Follow Up Recommendations No PT follow up;Supervision/Assistance - 24 hour (will eventually need outpatient PT services )    Equipment Recommendations  None recommended by PT    Recommendations for Other Services       Precautions / Restrictions Precautions Precautions: Back Precaution Booklet Issued: Yes (comment) Precaution Comments: provided hand out and verablly reviewed Required Braces or Orthoses: Spinal Brace Spinal Brace: Lumbar corset;Applied in sitting position Restrictions Weight Bearing Restrictions: No      Mobility  Bed Mobility Overal bed mobility: Needs Assistance Bed Mobility: Rolling;Sidelying to Sit Rolling: Min assist Sidelying to sit: Min guard       General bed mobility comments: Min assist for full roll onto L side. VCs for log roll technique, pt with poor return demonstration reporting increased LLE pain with technique.  Transfers Overall transfer level: Needs assistance Equipment used: Rolling walker (2 wheeled);None Transfers: Sit to/from Stand Sit to Stand: Supervision         General transfer comment: Supervision for safety. VCs for safe hand placement and proper RW use..  Ambulation/Gait Ambulation/Gait assistance: Supervision Ambulation Distance (Feet): 180 Feet Assistive device: Rolling walker (2 wheeled) Gait Pattern/deviations: Step-to pattern;Decreased stride  length;Decreased dorsiflexion - left;Antalgic;Trunk flexed Gait velocity: decreased Gait velocity interpretation: Below normal speed for age/gender General Gait Details: step to to emerging step through gait, cues for posture and pacing.   Stairs            Wheelchair Mobility    Modified Rankin (Stroke Patients Only)       Balance Overall balance assessment: Needs assistance Sitting-balance support: No upper extremity supported;Feet supported Sitting balance-Leahy Scale: Fair Sitting balance - Comments: limitations due to painful incision sites   Standing balance support: No upper extremity supported;During functional activity Standing balance-Leahy Scale: Fair Standing balance comment: able to maintain static standing balance without UE support                             Pertinent Vitals/Pain Pain Assessment: 0-10 Pain Score: 10-Worst pain ever Pain Location: "whole body is an 8, but my left leg is a 10" Pain Descriptors / Indicators: Aching;Sore;Guarding Pain Intervention(s): Limited activity within patient's tolerance;Monitored during session;Premedicated before session;Repositioned    Home Living Family/patient expects to be discharged to:: Private residence Living Arrangements: Spouse/significant other;Children;Other relatives (husband, mother-in-law, and children) Available Help at Discharge: Family;Available 24 hours/day Type of Home: House Home Access: Ramped entrance     Home Layout: One level Home Equipment: Walker - 2 wheels;Walker - 4 wheels;Grab bars - toilet;Grab bars - tub/shower;Adaptive equipment      Prior Function Level of Independence: Needs assistance   Gait / Transfers Assistance Needed: independent  ADL's / Homemaking Assistance Needed: needed some assist with lower body dressing        Hand Dominance   Dominant Hand: Right    Extremity/Trunk Assessment   Upper Extremity Assessment: Overall WFL for tasks assessed  Lower Extremity Assessment: LLE deficits/detail;Defer to PT evaluation   LLE Deficits / Details: decreased ROM and strength due to pain  Cervical / Trunk Assessment: Other exceptions  Communication   Communication: No difficulties  Cognition Arousal/Alertness: Awake/alert Behavior During Therapy: Flat affect Overall Cognitive Status: Within Functional Limits for tasks assessed                      General Comments      Exercises        Assessment/Plan    PT Assessment Patient needs continued PT services  PT Diagnosis Difficulty walking;Abnormality of gait;Generalized weakness;Acute pain   PT Problem List Decreased strength;Decreased activity tolerance;Decreased balance;Decreased mobility;Decreased knowledge of precautions;Pain  PT Treatment Interventions DME instruction;Gait training;Functional mobility training;Therapeutic activities;Therapeutic exercise;Balance training;Patient/family education   PT Goals (Current goals can be found in the Care Plan section) Acute Rehab PT Goals Patient Stated Goal: to have less pain PT Goal Formulation: With patient/family Time For Goal Achievement: 03/28/16 Potential to Achieve Goals: Good    Frequency Min 5X/week   Barriers to discharge        Co-evaluation PT/OT/SLP Co-Evaluation/Treatment: Yes (dove tailed session with OT) Reason for Co-Treatment: For patient/therapist safety PT goals addressed during session: Mobility/safety with mobility OT goals addressed during session: ADL's and self-care;Proper use of Adaptive equipment and DME       End of Session Equipment Utilized During Treatment: Gait belt Activity Tolerance: Patient tolerated treatment well;Patient limited by fatigue;Patient limited by pain Patient left: in bed;with call bell/phone within reach;with family/visitor present Nurse Communication: Mobility status         Time: 0812-0829 PT Time Calculation (min) (ACUTE ONLY): 17  min   Charges:   PT Evaluation $PT Eval Moderate Complexity: 1 Procedure     PT G CodesFabio Asa 04-10-16, 10:05 AM Charlotte Crumb, PT DPT  [phone]

## 2016-03-14 NOTE — Progress Notes (Signed)
Occupational Therapy Evaluation Patient Details Name: Stacy Deleon MRN: [id number] DOB: 03-05-1981 Today's Date: 03/14/2016    History of Present Illness 35 y.o. female s/p L4-5 ALIF w/ abdominal exposure. PMH significant for GERD, anxiety, depression, and obesity.   Clinical Impression   PTA, pt required min assist for LB dressing due to LLE pain and was independent with mobility. Pt currently requires mod assist for LB ADLs and supervision for basic transfers. Began education on back precautions, brace wear protocol, and compensatory strategies for LB ADLs. Pt plans to d/c home with 24/7 assistance from various family members. Pt will benefit from continued acute OT to increase independence and safety with ADLs and mobility to allow for safe discharge home. Recommend tub bench and 3in1 for home use.    Follow Up Recommendations  No OT follow up;Supervision/Assistance - 24 hour    Equipment Recommendations  Tub/shower bench;3 in 1 bedside comode    Recommendations for Other Services       Precautions / Restrictions Precautions Precautions: Back Precaution Booklet Issued: Yes (comment) Precaution Comments: provided hand out and verablly reviewed Required Braces or Orthoses: Spinal Brace Spinal Brace: Lumbar corset;Applied in sitting position Restrictions Weight Bearing Restrictions: No      Mobility Bed Mobility Overal bed mobility: Needs Assistance Bed Mobility: Rolling;Sidelying to Sit Rolling: Min assist Sidelying to sit: Min guard       General bed mobility comments: Min assist for full roll onto L side. VCs for log roll technique, pt with poor return demonstration reporting increased LLE pain with technique.  Transfers Overall transfer level: Needs assistance Equipment used: Rolling walker (2 wheeled);None Transfers: Sit to/from Stand Sit to Stand: Supervision         General transfer comment: Supervision for safety. VCs for safe hand placement and  proper RW use..    Balance Overall balance assessment: Needs assistance Sitting-balance support: No upper extremity supported;Feet supported Sitting balance-Leahy Scale: Fair Sitting balance - Comments: limitations due to painful incision sites   Standing balance support: No upper extremity supported;During functional activity Standing balance-Leahy Scale: Fair Standing balance comment: able to maintain static standing balance without UE support                            ADL Overall ADL's : Needs assistance/impaired     Grooming: Wash/dry hands;Supervision/safety;Standing   Upper Body Bathing: Set up;Sitting   Lower Body Bathing: Moderate assistance;Sit to/from stand;With adaptive equipment Lower Body Bathing Details (indicate cue type and reason): educated on use of long-handled sponge Upper Body Dressing : Set up;Sitting   Lower Body Dressing: Moderate assistance;Sit to/from stand;Cueing for compensatory techniques;With adaptive equipment Lower Body Dressing Details (indicate cue type and reason): has some AE already; cues to dress LLE first and undress it last Toilet Transfer: Supervision/safety;Cueing for safety;BSC;RW;Ambulation   Toileting- Clothing Manipulation and Hygiene: Min guard;Sit to/from stand;Cueing for compensatory techniques Toileting - Clothing Manipulation Details (indicate cue type and reason): cues to avoid twisting to flush toilet     Functional mobility during ADLs: Min guard;Rolling walker General ADL Comments: Educated on back precautions, brace wear protocol and began education on compensatory strategies for LB ADLs. Husband present for OT eval     Vision Vision Assessment?: No apparent visual deficits   Perception     Praxis      Pertinent Vitals/Pain Pain Assessment: 0-10 Pain Score: 10-Worst pain ever Pain Location: "whole body is an 8, but my left  leg is a 10" Pain Descriptors / Indicators: Aching;Sore;Guarding Pain  Intervention(s): Limited activity within patient's tolerance;Monitored during session;Premedicated before session;Repositioned     Hand Dominance Right   Extremity/Trunk Assessment Upper Extremity Assessment Upper Extremity Assessment: Overall WFL for tasks assessed   Lower Extremity Assessment Lower Extremity Assessment: LLE deficits/detail;Defer to PT evaluation LLE Deficits / Details: decreased ROM and strength due to pain   Cervical / Trunk Assessment Cervical / Trunk Assessment: Other exceptions Cervical / Trunk Exceptions: s/p lumbar surgery with abdominal exposure   Communication Communication Communication: No difficulties   Cognition Arousal/Alertness: Awake/alert Behavior During Therapy: Flat affect Overall Cognitive Status: Within Functional Limits for tasks assessed                     General Comments       Exercises       Shoulder Instructions      Home Living Family/patient expects to be discharged to:: Private residence Living Arrangements: Spouse/significant other;Children;Other relatives (husband, mother-in-law, and children) Available Help at Discharge: Family;Available 24 hours/day Type of Home: House Home Access: Ramped entrance     Home Layout: One level     Bathroom Shower/Tub: Tub/shower unit Shower/tub characteristics: Curtain Bathroom Toilet: Handicapped height     Home Equipment: Environmental consultant - 2 wheels;Walker - 4 wheels;Grab bars - toilet;Grab bars - tub/shower;Adaptive equipment Adaptive Equipment: Long-handled shoe horn;Reacher        Prior Functioning/Environment Level of Independence: Needs assistance  Gait / Transfers Assistance Needed: independent ADL's / Homemaking Assistance Needed: needed some assist with lower body dressing        OT Diagnosis: Acute pain   OT Problem List: Decreased strength;Decreased activity tolerance;Impaired balance (sitting and/or standing);Decreased safety awareness;Decreased knowledge of  use of DME or AE;Decreased knowledge of precautions;Obesity;Pain   OT Treatment/Interventions: Self-care/ADL training;Therapeutic exercise;DME and/or AE instruction;Patient/family education;Balance training    OT Goals(Current goals can be found in the care plan section) Acute Rehab OT Goals Patient Stated Goal: to have less pain OT Goal Formulation: With patient Time For Goal Achievement: 03/28/16 Potential to Achieve Goals: Good ADL Goals Pt Will Perform Lower Body Bathing: with modified independence;with adaptive equipment;sit to/from stand Pt Will Perform Lower Body Dressing: with modified independence;with adaptive equipment;sit to/from stand Pt Will Transfer to Toilet: with modified independence;ambulating;bedside commode (over toilet) Pt Will Perform Toileting - Clothing Manipulation and hygiene: with modified independence;sit to/from stand Pt Will Perform Tub/Shower Transfer: Tub transfer;with modified independence;ambulating;tub bench;rolling walker Additional ADL Goal #1: Pt will demonstrate adherence to 3/3 back precautions with no verbal cues.  OT Frequency: Min 2X/week   Barriers to D/C:            Co-evaluation PT/OT/SLP Co-Evaluation/Treatment: Yes     OT goals addressed during session: ADL's and self-care;Proper use of Adaptive equipment and DME      End of Session Equipment Utilized During Treatment: Gait belt;Rolling walker;Back brace Nurse Communication: Mobility status;Other (comment) (Will need tub bench)  Activity Tolerance: Patient tolerated treatment well Patient left: Other (comment) (with PT)   Time: 7253-6644 OT Time Calculation (min): 9 min Charges:  OT General Charges $OT Visit: 1 Procedure OT Evaluation $OT Eval Moderate Complexity: 1 Procedure G-Codes:    Nils Pyle, OTR/L Pager: [phone] 03/14/2016, 9:21 AM

## 2016-03-14 NOTE — Progress Notes (Signed)
*  Preliminary Results* Bilateral lower extremity venous duplex completed. Bilateral lower extremities are negative for deep vein thrombosis. There is no evidence of Baker's cyst bilaterally.  03/14/2016  Maudry Mayhew, RVT, RDCS, RDMS

## 2016-03-14 NOTE — Progress Notes (Signed)
Patient ID: Stacy Deleon, female   DOB: July 23, 1981, 35 y.o.   MRN: [id number] [place] good this morning. Sitting up on the edge of the bed. Stands comfortably. Her abdomen is soft with the expected soreness. No nausea or vomiting. Able postop day 1. Will not follow actively. Please call if we can provide any assistance.

## 2016-03-14 NOTE — Op Note (Signed)
NAMEJAYONNA, KOEN NO.:  [id number]  MEDICAL RECORD NO.:  [id number]  LOCATION:  3C04C                        FACILITY:  [facility]  PHYSICIAN:  Sidonie Dexheimer D. Shon Baton, M.D. DATE OF BIRTH:  Dec 08, 1980  DATE OF PROCEDURE:  03/13/2016 DATE OF DISCHARGE:                              OPERATIVE REPORT   PREOPERATIVE DIAGNOSIS:  Postlaminectomy syndrome with recurrent disk herniation and degenerative disk disease, L4-5.  POSTOPERATIVE DIAGNOSIS:  Postlaminectomy syndrome with recurrent disk herniation and degenerative disk disease, L4-5.  OPERATIVE PROCEDURE:  Anterior lumbar interbody fusion, L4-5.  COMPLICATIONS:  None.  CONDITION:  Stable.  HISTORY:  This is a very pleasant 35 year old woman, who had a previous lumbar diskectomy and was doing well until she re-injured herself and has a recurrence.  Attempts at conservative management have failed to alleviate her pain.  The patient had a severe leg and horrific back pain.  After discussing treatment options, we elected to proceed with the aforementioned procedure.  All appropriate risks, benefits, and alternatives were discussed with the patient and consent was obtained.  FIRST ASSISTANT:  [facility].  APPROACH SURGEON:  Larina Earthly, MD.  OPERATIVE NOTE:  The patient was brought to the operating room and placed supine on the operating table.  After successful induction of general anesthesia and endotracheal intubation, TEDs, SCDs, and a Foley were inserted, the patient was placed supine on the operating table. After successful induction of general anesthesia and endotracheal intubation, the abdomen was prepped and draped in a standard fashion. Time-out was taken to confirm patient, procedure, and all other pertinent important data.  Dr. Tawanna Cooler Early and Dr. Anette Riedel, my PA, then scrubbed in and performed a standard anterior retroperitoneal approach to the lumbar spine.  Once the retractors were in place, I  then scrubbed in and proceeded with the diskectomy.  After confirming the L4-5 level with the imaging, annulotomy was performed with a 10 blade scalpel and then using pituitary rongeurs and Kerrison rongeurs, I removed all the disk material.  I used a curette to scrape the cartilaginous endplate off and released and revealed the bleeding subchondral bone.  Posteriorly, I used a nerve hook to mobilize the disk fragment centrally and removed it with a pituitary rongeur. This allowed me to use my 2 mm Kerrison to remove the remaining posterior annulus and posterior longitudinal ligament.  With this released, then under live fluoro, I could distract the endplates and get parallel distraction.  I then trialed intervertebral spacers and elected to use the size 16, 8- degree lordotic RSV inner plate.  This was obtained and packed with a combination of DBX mix and InvivoGen.  It was then [facility] to the appropriate depths.  The anterior 0 profile plate was affixed and 2 screws were placed through the cage into the L4 vertebral body and 1 into the L5.  All screws got excellent purchase.  I then placed the locking cap to prevent backout of the screws.  I then irrigated the wound copiously with normal saline and then sequentially removed the retractors confirming that there was no bleeding.  Once I had hemostasis, I then closed the rectus fascia with a #1 Stratafix and then  did a layered closure with 0 Vicryl, 2-0 Vicryl, and a 3-0 Monocryl for the skin.  Steri-Strips and dry dressings were applied.  At the end of the case, final x-rays were taken which were satisfactory.     Lindwood Mogel D. Shon Baton, M.D.     DDB/MEDQ  D:  03/13/2016  T:  03/14/2016  Job:  [id number]  cc:   Larina Earthly, M.D.

## 2016-03-14 NOTE — Progress Notes (Signed)
Patient ID: Stacy Deleon, female   DOB: [birth year], 35 y.o.   MRN: [id number]    Subjective: 1 Day Post-Op Procedure(s) (LRB): ANTERIOR LUMBAR FUSION L4-L5    (1 LEVEL)  ALIF (N/A) ABDOMINAL EXPOSURE (N/A) Patient reports pain as 7 on 0-10 scale.  Right leg pain improved.  Left leg pain increased.  Incisional pain  Denies CP or SOB.  Voiding without difficulty. Positive flatus. Taking medication for nausea.  Has not eaten Objective: Vital signs in last 24 hours: Temp:  [97.7 F (36.5 C)-98.5 F (36.9 C)] 98.1 F (36.7 C) (07/13 0400) Pulse Rate:  [55-106] 55 (07/13 0400) Resp:  [10-22] 18 (07/13 0400) BP: (104-135)/(49-75) 133/73 mmHg (07/13 0400) SpO2:  [96 %-100 %] 98 % (07/13 0400)  Intake/Output from previous day: 07/12 0701 - 07/13 0700 In: 2240 [P.O.:240; I.V.:2000] Out: 1000 [Urine:900; Blood:100] Intake/Output this shift:    Labs: No results for input(s): HGB in the last 72 hours. No results for input(s): WBC, RBC, HCT, PLT in the last 72 hours. No results for input(s): NA, K, CL, CO2, BUN, CREATININE, GLUCOSE, CALCIUM in the last 72 hours. No results for input(s): LABPT, INR in the last 72 hours.  Physical Exam: Neurologically intact ABD soft Sensation intact distally Dorsiflexion/Plantar flexion intact Incision: no drainage Compartment soft  Assessment/Plan: 1 Day Post-Op Procedure(s) (LRB): ANTERIOR LUMBAR FUSION L4-L5    (1 LEVEL)  ALIF (N/A) ABDOMINAL EXPOSURE (N/A) Advance diet Up with therapy  Added Lyrica to address leg pain Pt to have a doppler today   Mayo, [name] for Dr. Melina Schools [facility] [phone] 03/14/2016, 7:27 AM

## 2016-03-15 MED ORDER — DIPHENHYDRAMINE HCL 25 MG PO CAPS
25.0000 mg | ORAL_CAPSULE | Freq: Once | ORAL | Status: AC
  Administered 2016-03-15: 25 mg via ORAL

## 2016-03-15 MED ORDER — FLEET ENEMA 7-19 GM/118ML RE ENEM
1.0000 | ENEMA | Freq: Once | RECTAL | Status: AC
  Administered 2016-03-15: 1 via RECTAL
  Filled 2016-03-15: qty 1

## 2016-03-15 MED ORDER — DICLOFENAC SODIUM 1 % TD GEL
2.0000 g | Freq: Four times a day (QID) | TRANSDERMAL | Status: DC
  Administered 2016-03-15: 2 g via TOPICAL
  Filled 2016-03-15: qty 100

## 2016-03-15 MED ORDER — ONDANSETRON HCL 4 MG PO TABS
4.0000 mg | ORAL_TABLET | Freq: Three times a day (TID) | ORAL | Status: DC | PRN
  Administered 2016-03-15 – 2016-03-16 (×2): 4 mg via ORAL
  Filled 2016-03-15 (×2): qty 1

## 2016-03-15 NOTE — Progress Notes (Signed)
Physical Therapy Treatment Patient Details Name: Stacy Deleon MRN: [id number] DOB: 1980-11-22 Today's Date: 03/15/2016    History of Present Illness 35 y.o. female s/p L4-5 ALIF w/ abdominal exposure. PMH significant for GERD, anxiety, depression, and obesity.    PT Comments    Pt is getting up to walk with slow pace and difficulty controlling RLE, dense cues for step through and controlling posture.  Her plan is to go home to recover and will refer to outpatient when surgeon is ready.  PT to follow acutely for mobility and instruction for back precautions, until ready to dc.   Follow Up Recommendations  No PT follow up;Supervision/Assistance - 24 hour (until surgeon refers to outpatient)     Equipment Recommendations  None recommended by PT    Recommendations for Other Services       Precautions / Restrictions Precautions Precautions: Back Required Braces or Orthoses: Spinal Brace Spinal Brace: Lumbar corset;Applied in sitting position Restrictions Weight Bearing Restrictions: No    Mobility  Bed Mobility Overal bed mobility: Needs Assistance Bed Mobility: Supine to Sit;Sit to Supine Rolling: Min guard Sidelying to sit: Min guard   Sit to supine: Min guard (cued technique)   General bed mobility comments: Pt sitting on EOB upon my arrival  Transfers Overall transfer level: Needs assistance Equipment used: Rolling walker (2 wheeled) Transfers: Sit to/from [employer] Sit to Stand: Supervision Stand pivot transfers: Supervision       General transfer comment: cued for maintaining position of walker, reminders for hand placement and control of descent  Ambulation/Gait Ambulation/Gait assistance: Min guard Ambulation Distance (Feet): 180 Feet Assistive device: Rolling walker (2 wheeled) Gait Pattern/deviations: Step-through pattern;Step-to pattern;Decreased stride length;Wide base of support;Shuffle (poor clearance of R foot) Gait velocity:  decreased Gait velocity interpretation: Below normal speed for age/gender General Gait Details: step to to emerging step through gait, cues for posture and pacing.    Stairs            Wheelchair Mobility    Modified Rankin (Stroke Patients Only)       Balance Overall balance assessment: Needs assistance Sitting-balance support: Feet supported Sitting balance-Leahy Scale: Good   Postural control: Posterior lean Standing balance support: Bilateral upper extremity supported Standing balance-Leahy Scale: Fair                      Cognition Arousal/Alertness: Awake/alert Behavior During Therapy: WFL for tasks assessed/performed Overall Cognitive Status: Within Functional Limits for tasks assessed                      Exercises      General Comments        Pertinent Vitals/Pain Pain Assessment: 0-10 Pain Score: 10-Worst pain ever Pain Location: L leg Pain Descriptors / Indicators: Aching Pain Intervention(s): Monitored during session;Premedicated before session;Repositioned;Ice applied;Limited activity within patient's tolerance    Home Living                      Prior Function            PT Goals (current goals can now be found in the care plan section) Acute Rehab PT Goals Patient Stated Goal: to have less pain Progress towards PT goals: Progressing toward goals    Frequency  Min 5X/week    PT Plan Current plan remains appropriate    Co-evaluation             End of  Session Equipment Utilized During Treatment: Gait belt Activity Tolerance: Patient tolerated treatment well;Patient limited by fatigue;Patient limited by pain Patient left: in bed;with call bell/phone within reach;with family/visitor present     Time: 1610-9604 PT Time Calculation (min) (ACUTE ONLY): 19 min  Charges:  $Gait Training: 8-22 mins                    G Codes:      Ivar Drape 2016-03-26, 11:22 AM    Samul Dada, PT MS Acute Rehab  Dept. Number: [facility] [id number] and [facility] [phone]

## 2016-03-15 NOTE — Care Management (Signed)
Received a call from [place] at [facility] they received referral for HHPT. Shortly after that [place] from [facility] stating they cannot accept referral but do not have Sabula Cook's phone number . Called [facility] and left voice mail [facility] unable to accept referral .  Magdalen Spatz RN BSN [phone]

## 2016-03-15 NOTE — Care Management Note (Addendum)
Case Management Note  Patient Details  Name: Stacy Deleon MRN: [id number] Date of Birth: 04-Oct-1980  Subjective/Objective:                    Action/Plan:  Spoke with [facility] , he is aware HHPT and tub bench needed fax orders to him at [phone]  Added Hand held shower head and toileting tongs spoke with Santiago Glad at Dr Rolena Infante office for signature  Expected Discharge Date:                  Expected Discharge Plan:  [place]  In-House Referral:     Discharge planning Services  CM Consult  Post Acute Care Choice:  Durable Medical Equipment, Home Health Choice offered to:  NA (patient is under worker's comp, they will arrange for DME)  DME Arranged:  Tub bench DME Agency:   (TBD by worker's comp)  HH Arranged:  PT HH Agency:  NA  Status of Service:  Completed, signed off  If discussed at [place] of Stay Meetings, dates discussed:    Additional Comments:  Marilu Favre, RN 03/15/2016, 8:36 AM

## 2016-03-15 NOTE — Progress Notes (Signed)
Occupational Therapy Treatment and Discharge Patient Details Name: Stacy Deleon MRN: [id number] DOB: August 14, 1981 Today's Date: 03/15/2016    History of present illness 35 y.o. female s/p L4-5 ALIF w/ abdominal exposure. PMH significant for GERD, anxiety, depression, and obesity.   OT comments  This 35 yo female admitted and underwent above presents to acute OT with all education completed, we will sign off.  Follow Up Recommendations  No OT follow up;Supervision/Assistance - 24 hour    Equipment Recommendations  Tub/shower bench;Other (comment) (hand held shower head, toileting tongs--CM aware of these 2 items)       Precautions / Restrictions Precautions Precautions: Back Required Braces or Orthoses: Spinal Brace Spinal Brace: Lumbar corset;Applied in sitting position Restrictions Weight Bearing Restrictions: No       Mobility Bed Mobility               General bed mobility comments: Pt sitting on EOB upon my arrival  Transfers Overall transfer level: Needs assistance Equipment used: Rolling walker (2 wheeled) Transfers: Sit to/from Stand Sit to Stand: Supervision              Balance Overall balance assessment: Needs assistance Sitting-balance support: No upper extremity supported;Feet supported         Standing balance-Leahy Scale: Fair                     ADL Overall ADL's : Needs assistance/impaired                                       General ADL Comments: Educated pt on use of sock aid to don socks, reacher to doff socks and don shorts, toliet aid for back peri-care. We discussed how she would manage the tub bench and the shower curtain to avoid getting water out in the floor. I also educated her on using a towel on the tub bench to help sliding in and out easier.  She returned demo of reacher, sock aid, and toilet aid. Pt also educated on using wet wipes for back pericare and using 2 cups for brushing teeth (one to spit  in and one for rinsing)                Cognition   Behavior During Therapy: [facility] for tasks assessed/performed Overall Cognitive Status: Within Functional Limits for tasks assessed                                    Pertinent Vitals/ Pain       Pain Assessment: 0-10 Pain Score: 9  Pain Location: LLE Pain Descriptors / Indicators: Nagging;Numbness;Aching;Grimacing;Guarding Pain Intervention(s): Monitored during session;Repositioned (RN in for pain meds while I was in room)         Frequency Min 2X/week     Progress Toward Goals  OT Goals(current goals can now be found in the care plan section)  Progress towards OT goals:  (all education completed and pt without further questions in regards to basic ADLs--she did ask if I knew how to adjust brakes on rollator, but I did not)     Plan Equipment recommendations need to be updated       End of Session Equipment Utilized During Treatment: Rolling walker;Back brace   Activity Tolerance Patient tolerated treatment well   Patient Left  (  sitting EOB)           Time: 2536-6440 OT Time Calculation (min): 34 min  Charges: OT General Charges $OT Visit: 1 Procedure OT Treatments $Self Care/Home Management : 23-37 mins  Evette Georges [phone] 03/15/2016, 9:34 AM

## 2016-03-15 NOTE — Progress Notes (Signed)
    Subjective: Procedure(s) (LRB): ANTERIOR LUMBAR FUSION L4-L5    (1 LEVEL)  ALIF (N/A) ABDOMINAL EXPOSURE (N/A) 2 Days Post-Op  Patient reports pain as 3 on 0-10 scale.  Reports increased left groin/leg pain reports incisional back pain   Positive void Negative bowel movement Positive flatus Negative chest pain or shortness of breath  Objective: Vital signs in last 24 hours: Temp:  [98 F (36.7 C)-98.9 F (37.2 C)] 98 F (36.7 C) (07/14 0413) Pulse Rate:  [50-76] 54 (07/14 0413) Resp:  [18-20] 20 (07/14 0413) BP: (94-136)/(49-68) 136/68 mmHg (07/14 0413) SpO2:  [98 %-100 %] 100 % (07/14 0413)  Intake/Output from previous day:    Labs: No results for input(s): WBC, RBC, HCT, PLT in the last 72 hours. No results for input(s): NA, K, CL, CO2, BUN, CREATININE, GLUCOSE, CALCIUM in the last 72 hours. No results for input(s): LABPT, INR in the last 72 hours.  Physical Exam: Neurologically intact ABD soft Intact pulses distally Incision: dressing C/D/I and no drainage Compartment soft  Assessment/Plan: Patient stable  xrays satisfactory Continue mobilization with physical therapy Continue care  Advance diet Up with therapy  Complains of left leg pain.  DVT scan negative.  Ambulating independently.  Compartments soft/nt Groin pain most likely due to retraction of psoas muscle and left sided abdominal approach.   Will monitor exam Enema and amitiza for constipation   Melina Schools, MD [place] [phone]

## 2016-03-16 LAB — CBC
HEMATOCRIT: 38.1 % (ref 36.0–46.0)
HEMOGLOBIN: 11.8 g/dL — AB (ref 12.0–15.0)
MCH: 27.4 pg (ref 26.0–34.0)
MCHC: 31 g/dL (ref 30.0–36.0)
MCV: 88.4 fL (ref 78.0–100.0)
PLATELETS: 333 10*3/uL (ref 150–400)
RBC: 4.31 MIL/uL (ref 3.87–5.11)
RDW: 12.4 % (ref 11.5–15.5)
WBC: 9.5 10*3/uL (ref 4.0–10.5)

## 2016-03-16 MED ORDER — LUBIPROSTONE 24 MCG PO CAPS: 24.0000 ug | ORAL_CAPSULE | Freq: Two times a day (BID) | ORAL | Status: DC

## 2016-03-16 MED ORDER — DICLOFENAC SODIUM 1 % TD GEL: 2.0000 g | Freq: Four times a day (QID) | TRANSDERMAL | Status: DC

## 2016-03-16 NOTE — Progress Notes (Signed)
Physical Therapy Treatment Patient Details Name: Stacy Deleon MRN: [id number] DOB: 1980/10/06 Today's Date: 03/16/2016    History of Present Illness 35 y.o. female s/p L4-5 ALIF w/ abdominal exposure. PMH significant for GERD, anxiety, depression, and obesity.    PT Comments    Patient seen for reinforcement of spinal precautions, education for discharge and increased mobility. Patient mobilizing well, less assist and cues this session. Receptive to education. Good recall of precautions. Will have adequate assist at home. Anticipate d/c home with HHPT.  Follow Up Recommendations  Home health PT;Supervision/Assistance - 24 hour (will eventually need outpatient PT services )     Equipment Recommendations  None recommended by PT    Recommendations for Other Services       Precautions / Restrictions Precautions Precautions: Back Precaution Booklet Issued: Yes (comment) Precaution Comments: provided hand out and verablly reviewed Required Braces or Orthoses: Spinal Brace Spinal Brace: Lumbar corset;Applied in sitting position Restrictions Weight Bearing Restrictions: No    Mobility  Bed Mobility               General bed mobility comments: received EOB  Transfers Overall transfer level: Modified independent Equipment used: Rolling walker (2 wheeled);None Transfers: Sit to/from Stand Sit to Stand: Modified independent (Device/Increase time)         General transfer comment: no cues or physical assist  Ambulation/Gait Ambulation/Gait assistance: Supervision Ambulation Distance (Feet): 200 Feet Assistive device: Rolling walker (2 wheeled) Gait Pattern/deviations: Step-to pattern;Decreased stride length;Decreased dorsiflexion - left;Antalgic;Trunk flexed Gait velocity: decreased Gait velocity interpretation: Below normal speed for age/gender General Gait Details: step to to emerging step through gait, cues for posture and pacing.    Stairs             Wheelchair Mobility    Modified Rankin (Stroke Patients Only)       Balance Overall balance assessment: Needs assistance Sitting-balance support: Feet supported Sitting balance-Leahy Scale: Fair Sitting balance - Comments: limitations due to painful incision sites   Standing balance support: Bilateral upper extremity supported Standing balance-Leahy Scale: Fair                      Cognition Arousal/Alertness: Awake/alert Behavior During Therapy: Flat affect Overall Cognitive Status: Within Functional Limits for tasks assessed                      Exercises      General Comments        Pertinent Vitals/Pain Pain Assessment: 0-10 Pain Score: 10-Worst pain ever Pain Location: LLE hip and buttocks Pain Descriptors / Indicators: Aching Pain Intervention(s): Monitored during session    Home Living Family/patient expects to be discharged to:: Private residence Living Arrangements: Spouse/significant other;Children;Other relatives (husband, mother-in-law, and children) Available Help at Discharge: Family;Available 24 hours/day Type of Home: House Home Access: Ramped entrance   Home Layout: One level Home Equipment: Walker - 2 wheels;Walker - 4 wheels;Grab bars - toilet;Grab bars - tub/shower;Adaptive equipment      Prior Function Level of Independence: Needs assistance  Gait / Transfers Assistance Needed: independent ADL's / Homemaking Assistance Needed: needed some assist with lower body dressing     PT Goals (current goals can now be found in the care plan section) Acute Rehab PT Goals Patient Stated Goal: to have less pain PT Goal Formulation: With patient/family Time For Goal Achievement: 03/28/16 Potential to Achieve Goals: Good Progress towards PT goals: Progressing toward goals    Frequency  Min 5X/week    PT Plan Current plan remains appropriate    Co-evaluation             End of Session Equipment Utilized During  Treatment: Gait belt Activity Tolerance: Patient tolerated treatment well;Patient limited by fatigue;Patient limited by pain Patient left: in bed;with call bell/phone within reach;with family/visitor present     Time: 0950-1009 PT Time Calculation (min) (ACUTE ONLY): 19 min  Charges:  $Gait Training: 8-22 mins                    G CodesFabio Asa 04/14/16, 10:28 AM Charlotte Crumb, PT DPT  [phone]

## 2016-03-16 NOTE — Progress Notes (Signed)
Subjective: 3 Days Post-Op Procedure(s) (LRB): ANTERIOR LUMBAR FUSION L4-L5    (1 LEVEL)  ALIF (N/A) ABDOMINAL EXPOSURE (N/A) Patient reports pain as moderate.  Reports incisional pain and back pain. Still having trouble lifting the left leg. She is concerned about swelling around the incisional area. She is concerned about bleeding or infection. Would like Rx for voltaren gel for home. Feels pain is controlled with PO meds and will be able to keep up with that at home. Feels ready to go home today.  Objective: Vital signs in last 24 hours: Temp:  [97.6 F (36.4 C)-101.8 F (38.8 C)] 98.8 F (37.1 C) (07/15 0742) Pulse Rate:  [61-95] 83 (07/15 0742) Resp:  [18-20] 20 (07/15 0742) BP: (99-143)/(57-82) 110/82 mmHg (07/15 0742) SpO2:  [97 %-100 %] 100 % (07/15 0742)  Intake/Output from previous day: 07/14 0701 - 07/15 0700 In: 720 [P.O.:720] Out: -  Intake/Output this shift:     Recent Labs  03/16/16 0756  HGB 11.8*    Recent Labs  03/16/16 0756  WBC 9.5  RBC 4.31  HCT 38.1  PLT 333   No results for input(s): NA, K, CL, CO2, BUN, CREATININE, GLUCOSE, CALCIUM in the last 72 hours. No results for input(s): LABPT, INR in the last 72 hours.  Neurologically intact ABD soft Neurovascular intact Sensation intact distally Intact pulses distally Dorsiflexion/Plantar flexion intact Incision: dressing C/D/I and no drainage No cellulitis present Compartment soft no calf pain or sign of DVT  Incisional area covered with aquacel dressing. Minimal tenderness on exam. No erythema, drainage, fluctuance, firmness, or any sign of bleeding or infection at the site.  Assessment/Plan: 3 Days Post-Op Procedure(s) (LRB): ANTERIOR LUMBAR FUSION L4-L5    (1 LEVEL)  ALIF (N/A) ABDOMINAL EXPOSURE (N/A) Advance diet Up with therapy D/C IV fluids  Rx for voltaren gel and amitiza written Discussed D/C instructions CBC prior to D/C  D/C home today Expect L leg strength and pain to  improve with time Discussed with Dr. Nash Shearer, [place]. 03/16/2016, 9:32 AM

## 2016-03-16 NOTE — Discharge Summary (Signed)
Physician Discharge Summary   Patient ID: Stacy Deleon MRN: [id number] DOB/AGE: 02/25/1981 35 y.o.  Admit date: 03/13/2016 Discharge date: 03/16/2016  Primary Diagnosis:   DDD WITH RECURRENT HNP   Admission Diagnoses:  Past Medical History  Diagnosis Date  . Bronchitis   . Polycystic ovary syndrome   . Anxiety   . Depression   . ADHD (attention deficit hyperactivity disorder)   . PONV (postoperative nausea and vomiting)   . Incontinence of urine   . GERD (gastroesophageal reflux disease)   . Neuromuscular disorder (HCC)     MIGRAINES   . Headache    Discharge Diagnoses:   Active Problems:   Back pain  Procedure:  Procedure(s) (LRB): ANTERIOR LUMBAR FUSION L4-L5    (1 LEVEL)  ALIF (N/A) ABDOMINAL EXPOSURE (N/A)   Consults: vascular surgery - surgical exposure  HPI:  see H&P    Laboratory Data: Hospital Outpatient Visit on 03/06/2016  Component Date Value Ref Range Status  . Sodium 03/06/2016 136  135 - 145 mmol/L Final  . Potassium 03/06/2016 3.7  3.5 - 5.1 mmol/L Final  . Chloride 03/06/2016 104  101 - 111 mmol/L Final  . CO2 03/06/2016 24  22 - 32 mmol/L Final  . Glucose, Bld 03/06/2016 112* 65 - 99 mg/dL Final  . BUN 78/29/5621 9  6 - 20 mg/dL Final  . Creatinine, Ser 03/06/2016 0.79  0.44 - 1.00 mg/dL Final  . Calcium 30/86/5784 9.4  8.9 - 10.3 mg/dL Final  . GFR calc non Af Amer 03/06/2016 >60  >60 mL/min Final  . GFR calc Af Amer 03/06/2016 >60  >60 mL/min Final   Comment: (NOTE) The eGFR has been calculated using the CKD EPI equation. This calculation has not been validated in all clinical situations. eGFR's persistently <60 mL/min signify possible Chronic Kidney Disease.   . Anion gap 03/06/2016 8  5 - 15 Final  . WBC 03/06/2016 6.8  4.0 - 10.5 K/uL Final  . RBC 03/06/2016 4.69  3.87 - 5.11 MIL/uL Final  . Hemoglobin 03/06/2016 13.2  12.0 - 15.0 g/dL Final  . HCT 69/62/9528 40.4  36.0 - 46.0 % Final  . MCV 03/06/2016 86.1  78.0 - 100.0 fL  Final  . MCH 03/06/2016 28.1  26.0 - 34.0 pg Final  . MCHC 03/06/2016 32.7  30.0 - 36.0 g/dL Final  . RDW 41/32/4401 12.6  11.5 - 15.5 % Final  . Platelets 03/06/2016 320  150 - 400 K/uL Final  . Preg, Serum 03/06/2016 NEGATIVE  NEGATIVE Final   Comment:        THE SENSITIVITY OF THIS METHODOLOGY IS >10 mIU/mL.   . ABO/RH(D) 03/06/2016 O POS   Final  . Antibody Screen 03/06/2016 NEG   Final  . Sample Expiration 03/06/2016 03/20/2016   Final  . Extend sample reason 03/06/2016 NO TRANSFUSIONS OR PREGNANCY IN THE PAST 3 MONTHS   Final  . MRSA, PCR 03/06/2016 NEGATIVE  NEGATIVE Final  . Staphylococcus aureus 03/06/2016 NEGATIVE  NEGATIVE Final   Comment:        The Xpert SA Assay (FDA approved for NASAL specimens in patients over 21 years of age), is one component of a comprehensive surveillance program.  Test performance has been validated by [facility] for patients greater than or equal to 74 year old. It is not intended to diagnose infection nor to guide or monitor treatment.   . ABO/RH(D) 03/06/2016 O POS   Final   No results for input(s): HGB  in the last 72 hours. No results for input(s): WBC, RBC, HCT, PLT in the last 72 hours. No results for input(s): NA, K, CL, CO2, BUN, CREATININE, GLUCOSE, CALCIUM in the last 72 hours. No results for input(s): LABPT, INR in the last 72 hours.  X-Rays:Dg Lumbar Spine 2-3 Views  03/14/2016  CLINICAL DATA:  S/P lumbar fusion EXAM: LUMBAR SPINE - 2-3 VIEW COMPARISON:  03/13/2016 FINDINGS: Patient has undergone anterior fusion at L4-5. Alignment is normal. Tubal ligation clips identified within the pelvis. IMPRESSION: Status post anterior fusion L4-5. Electronically Signed   By: Norva Pavlov M.D.   On: 03/14/2016 09:05   Dg Lumbar Spine 2-3 Views  03/13/2016  CLINICAL DATA:  L4-5 PLIF EXAM: DG C-ARM 61-120 MIN; LUMBAR SPINE - 2-3 VIEW COMPARISON:  03/17/2015. FINDINGS: Two spot fluoro images are submitted after the procedure. Patient has  undergone 1 interval ala Fahrenheit at L4-5. No evidence for immediate hardware complications. IMPRESSION: Intraoperative assessment during anterior fusion at L4-5. Electronically Signed   By: Kennith Center M.D.   On: 03/13/2016 11:59   Dg C-arm 61-120 Min  03/13/2016  CLINICAL DATA:  L4-5 PLIF EXAM: DG C-ARM 61-120 MIN; LUMBAR SPINE - 2-3 VIEW COMPARISON:  03/17/2015. FINDINGS: Two spot fluoro images are submitted after the procedure. Patient has undergone 1 interval ala Fahrenheit at L4-5. No evidence for immediate hardware complications. IMPRESSION: Intraoperative assessment during anterior fusion at L4-5. Electronically Signed   By: Kennith Center M.D.   On: 03/13/2016 11:59   Dg Or Local Abdomen  03/13/2016  CLINICAL DATA:  35 year old female- L4-L5 PLIF. Spinal instrumentation count. EXAM: OR LOCAL ABDOMEN COMPARISON:  03/17/2015 FINDINGS: Fusion hardware at L4-L5 noted. Bilateral tubal ligation clips are present. No unexpected radiopaque foreign objects are identified. IMPRESSION: No unexpected radiopaque foreign objects. L4-L5 fusion hardware. These results were called to the OR at 11:30 a.m. on 03/13/2016. Electronically Signed   By: Harmon Pier M.D.   On: 03/13/2016 11:31    EKG: Orders placed or performed during the hospital encounter of 05/20/11  . ED EKG  . ED EKG  . EKG  . EKG     Hospital Course: Patient was admitted to [facility] and taken to the OR and underwent the above state procedure without complications.  Patient tolerated the procedure well and was later transferred to the recovery room and then to the orthopaedic floor for postoperative care.  They were given PO and IV analgesics for pain control following their surgery.  They were given 24 hours of postoperative antibiotics.   PT was consulted postop to assist with mobility and transfers.  The patient was allowed to be WBAT with therapy and was taught back precautions. Discharge planning was consulted to help with  postop disposition and equipment needs.  Patient had a fair night on the evening of surgery and started to get up OOB with therapy on day one. Patient was seen in rounds and was ready to go home on day one.  They were given discharge instructions and dressing directions.  They were instructed on when to follow up in the office with Dr. Shelle Iron.   Diet: Regular diet Activity:WBAT Follow-up:in 10-14 days Disposition - Home Discharged Condition: good   Discharge Instructions    Call MD / Call 911    Complete by:  As directed   If you experience chest pain or shortness of breath, CALL 911 and be transported to the hospital emergency room.  If you develope a fever  above 101 F, pus (white drainage) or increased drainage or redness at the wound, or calf pain, call your surgeon's office.     Constipation Prevention    Complete by:  As directed   Drink plenty of fluids.  Prune juice may be helpful.  You may use a stool softener, such as Colace (over the counter) 100 mg twice a day.  Use MiraLax (over the counter) for constipation as needed.     Diet - low sodium heart healthy    Complete by:  As directed      Increase activity slowly as tolerated    Complete by:  As directed             Medication List    STOP taking these medications        HYDROcodone-acetaminophen 5-325 MG tablet  Commonly known as:  NORCO/VICODIN     pregabalin 75 MG capsule  Commonly known as:  LYRICA      TAKE these medications        diclofenac sodium 1 % Gel  Commonly known as:  VOLTAREN  Apply 2 g topically 4 (four) times daily.     enoxaparin 40 MG/0.4ML injection  Commonly known as:  LOVENOX  Inject 0.4 mLs (40 mg total) into the skin daily.     lubiprostone 24 MCG capsule  Commonly known as:  AMITIZA  Take 1 capsule (24 mcg total) by mouth 2 (two) times daily with a meal.     methocarbamol 500 MG tablet  Commonly known as:  ROBAXIN  Take 1 tablet (500 mg total) by mouth 3 (three) times daily as  needed for muscle spasms.     omeprazole 20 MG capsule  Commonly known as:  PRILOSEC  Take 20 mg by mouth daily as needed.     ondansetron 4 MG tablet  Commonly known as:  ZOFRAN  Take 1 tablet (4 mg total) by mouth every 8 (eight) hours as needed for nausea or vomiting.     oxyCODONE-acetaminophen 10-325 MG tablet  Commonly known as:  PERCOCET  Take 1 tablet by mouth every 4 (four) hours as needed for pain.         Signed: Andrez Grime, PA-C Orthopaedic Surgery 03/16/2016, 7:42 AM

## 2016-03-16 NOTE — Progress Notes (Signed)
Pt. discharged home accompanied by husband. Prescriptions and discharge instructions given with verbalization of understanding. Incision site on backwith no s/s of infection - no swelling, redness, bleeding, and/or drainage noted. Aspen corsett in intact. Pain med given just before leaving. Opportunity given to ask questions but no question asked. Pt. transported out of this unit in wheelchair by the volunteer.

## 2017-01-29 ENCOUNTER — Encounter (HOSPITAL_COMMUNITY): Payer: Self-pay | Admitting: Emergency Medicine

## 2017-01-29 ENCOUNTER — Emergency Department (HOSPITAL_COMMUNITY): Payer: Medicaid Other

## 2017-01-29 ENCOUNTER — Emergency Department (HOSPITAL_COMMUNITY)
Admission: EM | Admit: 2017-01-29 | Discharge: 2017-01-30 | Disposition: A | Discharge status: Home / Self Care | Payer: Medicaid Other | Attending: Emergency Medicine | Admitting: Emergency Medicine

## 2017-01-29 DIAGNOSIS — Z7901 Long term (current) use of anticoagulants: Secondary | ICD-10-CM | POA: Insufficient documentation

## 2017-01-29 DIAGNOSIS — Z87891 Personal history of nicotine dependence: Secondary | ICD-10-CM | POA: Diagnosis not present

## 2017-01-29 DIAGNOSIS — M5186 Other intervertebral disc disorders, lumbar region: Secondary | ICD-10-CM | POA: Insufficient documentation

## 2017-01-29 DIAGNOSIS — M5126 Other intervertebral disc displacement, lumbar region: Secondary | ICD-10-CM

## 2017-01-29 DIAGNOSIS — M25551 Pain in right hip: Secondary | ICD-10-CM | POA: Diagnosis present

## 2017-01-29 DIAGNOSIS — M545 Low back pain, unspecified: Secondary | ICD-10-CM

## 2017-01-29 LAB — CBC WITH DIFFERENTIAL/PLATELET
BASOS ABS: 0.1 10*3/uL (ref 0.0–0.1)
BASOS PCT: 1 %
EOS ABS: 0.3 10*3/uL (ref 0.0–0.7)
Eosinophils Relative: 2 %
HEMATOCRIT: 40.7 % (ref 36.0–46.0)
HEMOGLOBIN: 13.1 g/dL (ref 12.0–15.0)
Lymphocytes Relative: 27 %
Lymphs Abs: 3.4 10*3/uL (ref 0.7–4.0)
MCH: 27.5 pg (ref 26.0–34.0)
MCHC: 32.2 g/dL (ref 30.0–36.0)
MCV: 85.5 fL (ref 78.0–100.0)
Monocytes Absolute: 1.3 10*3/uL — ABNORMAL HIGH (ref 0.1–1.0)
Monocytes Relative: 10 %
NEUTROS ABS: 7.7 10*3/uL (ref 1.7–7.7)
NEUTROS PCT: 60 %
Platelets: 432 10*3/uL — ABNORMAL HIGH (ref 150–400)
RBC: 4.76 MIL/uL (ref 3.87–5.11)
RDW: 12.7 % (ref 11.5–15.5)
WBC: 12.7 10*3/uL — ABNORMAL HIGH (ref 4.0–10.5)

## 2017-01-29 LAB — BASIC METABOLIC PANEL
ANION GAP: 9 (ref 5–15)
BUN: 10 mg/dL (ref 6–20)
CALCIUM: 9.7 mg/dL (ref 8.9–10.3)
CHLORIDE: 101 mmol/L (ref 101–111)
CO2: 26 mmol/L (ref 22–32)
Creatinine, Ser: 0.86 mg/dL (ref 0.44–1.00)
GFR calc non Af Amer: >60 mL/min (ref >60)
Glucose, Bld: 102 mg/dL — ABNORMAL HIGH (ref 65–99)
Potassium: 4 mmol/L (ref 3.5–5.1)
SODIUM: 136 mmol/L (ref 135–145)

## 2017-01-29 LAB — PROTIME-INR
INR: 0.99
PROTHROMBIN TIME: 13 s (ref 11.4–15.2)

## 2017-01-29 MED ORDER — MORPHINE SULFATE (PF) 4 MG/ML IV SOLN
4.0000 mg | Freq: Once | INTRAVENOUS | Status: AC
  Administered 2017-01-29: 4 mg via INTRAVENOUS
  Filled 2017-01-29: qty 1

## 2017-01-29 MED ORDER — GADOBENATE DIMEGLUMINE 529 MG/ML IV SOLN
20.0000 mL | Freq: Once | INTRAVENOUS | Status: AC
  Administered 2017-01-29: 20 mL via INTRAVENOUS

## 2017-01-29 MED ORDER — ONDANSETRON HCL 4 MG/2ML IJ SOLN
4.0000 mg | Freq: Once | INTRAMUSCULAR | Status: AC
  Administered 2017-01-29: 4 mg via INTRAVENOUS
  Filled 2017-01-29: qty 2

## 2017-01-29 NOTE — ED Notes (Signed)
ED Provider at bedside. 

## 2017-01-29 NOTE — ED Notes (Signed)
No answer x 4 for triage

## 2017-01-29 NOTE — ED Provider Notes (Signed)
6:01 PM Patient initially screened in triage after being called by triage team. Patient sent to ED by her orthopedic spine team for MRI imaging and evaluation after onset of low back pain and right hip pain while walking today. On my exam, patient does have numbness in her right leg. No weakness in R leg. Patient had tenderness in her lumbar spine. Patient reports her orthopedics team requested MRI with contrast. This was ordered. Patient will also have diagnostic x-ray imaging of the pelvis and right hip. Patient will have pain medicine and labs collected. Patient will be given pain medicine and nausea medicine for symptom management while awaiting imaging.   Tegeler, Gwenyth Allegra, MD 01/30/17 [phone]

## 2017-01-29 NOTE — ED Triage Notes (Addendum)
Pt to ER for evaluation of lower back pain radiating to right hip that patient states mimics pain associated before she needed a lumbar fusion less than a year ago. MD Rolena Infante is patients surgeon. A/o x4. NAD. Reports she was walking today and heard a "pop" in her lower back and hip area, now unable to bear weight.

## 2017-01-30 MED ORDER — MORPHINE SULFATE (PF) 4 MG/ML IV SOLN
4.0000 mg | Freq: Once | INTRAVENOUS | Status: AC
  Administered 2017-01-30: 4 mg via INTRAVENOUS
  Filled 2017-01-30: qty 1

## 2017-01-30 MED ORDER — METHOCARBAMOL 500 MG PO TABS
500.0000 mg | ORAL_TABLET | Freq: Once | ORAL | Status: AC
  Administered 2017-01-30: 500 mg via ORAL
  Filled 2017-01-30: qty 1

## 2017-01-30 MED ORDER — MORPHINE SULFATE (PF) 4 MG/ML IV SOLN: 4.0000 mg | Freq: Once | INTRAVENOUS | Status: DC

## 2017-01-30 MED ORDER — NAPROXEN 500 MG PO TABS: 500.0000 mg | ORAL_TABLET | Freq: Two times a day (BID) | ORAL | 0 refills | Status: DC

## 2017-01-30 MED ORDER — HYDROCODONE-ACETAMINOPHEN 5-325 MG PO TABS: 2.0000 | ORAL_TABLET | Freq: Three times a day (TID) | ORAL | 0 refills | Status: DC | PRN

## 2017-01-30 NOTE — ED Notes (Signed)
Pt departed in NAD, escorted to front by this RN.

## 2017-01-30 NOTE — ED Provider Notes (Signed)
[place] DEPT Provider Note   CSN: [id number] Arrival date & time: 01/29/17  1628     History   Chief Complaint Chief Complaint  Patient presents with  . Hip Pain    HPI Stacy Deleon is a 36 y.o. female.  Stacy Deleon is a 36 y.o. Female with a history of lumbar laminectomy who presents to the emergency department after being sent by her orthopedic surgeon due to hip and back pain. Patient reports she was stepping out of her vehicle this afternoon onto uneven ground when she jerked her body sideways causing her to have pain to her hip and her right back. She did not fall to the ground. Patient reports she has some pain to her right back throughout her right leg chronically, however now this has worsened. She reports popping sensation to her right hip with walking. She reports increased tingling down her leg. Denies loss of bowel or bladder control. She spoke with her orthopedic surgeon Dr. Rolena Infante who sent her to the emergency department for an MRI with contrast. She reports feeling better after pain medication in triage. She denies fevers, numbness, loss of bladder control, loss of bowel control, urinary symptoms, abdominal pain, nausea, vomiting, falls or rashes.    The history is provided by the patient and medical records. No language interpreter was used.  Hip Pain  Pertinent negatives include no chest pain, no abdominal pain, no headaches and no shortness of breath.    Past Medical History:  Diagnosis Date  . ADHD (attention deficit hyperactivity disorder)   . Anxiety   . Bronchitis   . Depression   . GERD (gastroesophageal reflux disease)   . Headache   . Incontinence of urine   . Neuromuscular disorder (HCC)    MIGRAINES   . Polycystic ovary syndrome   . PONV (postoperative nausea and vomiting)     Patient Active Problem List   Diagnosis Date Noted  . Back pain 03/13/2016  . 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 ADD (attention deficit disorder)  05/24/2014  . DDD (degenerative disc disease), lumbar 05/24/2014  . Routine general medical examination at a health care facility 05/24/2014  . Affective disorder ([place]) 05/24/2014  . Depression, neurotic 04/06/2012  . Personal history of tobacco use, presenting hazards to health 04/06/2012  . Bilateral polycystic ovarian syndrome 03/31/2012  . Adiposity 09/05/2011    Past Surgical History:  Procedure Laterality Date  . ABDOMINAL EXPOSURE N/A 03/13/2016   Procedure: ABDOMINAL EXPOSURE;  Surgeon: Rosetta Posner, MD;  Location: [facility] OR;  Service: Vascular;  Laterality: N/A;  . ANTERIOR LUMBAR FUSION N/A 03/13/2016   Procedure: ANTERIOR LUMBAR FUSION L4-L5    (1 LEVEL)  ALIF;  Surgeon: Melina Schools, MD;  Location: [place];  Service: Orthopedics;  Laterality: N/A;  . HERNIA REPAIR    . LUMBAR LAMINECTOMY/DECOMPRESSION MICRODISCECTOMY Left 10/13/2014   Procedure: LEFT L4-L5 DISCECTOMY    (1 LEVEL);  Surgeon: Melina Schools, MD;  Location: [place];  Service: Orthopedics;  Laterality: Left;  . PLANTAR FASCIA SURGERY    . TUBAL LIGATION      OB History    Gravida Para Term Preterm AB Living   3 3 3     3    SAB TAB Ectopic Multiple Live Births                   Home Medications    Prior to Admission medications   Medication Sig Start Date End Date Taking? Authorizing  Provider  diclofenac sodium (VOLTAREN) 1 % GEL Apply 2 g topically 4 (four) times daily. 03/16/16   Cecilie Kicks, PA-C  enoxaparin (LOVENOX) 40 MG/0.4ML injection Inject 0.4 mLs (40 mg total) into the skin daily. 03/13/16   Melina Schools, MD  HYDROcodone-acetaminophen (NORCO/VICODIN) 5-325 MG tablet Take 2 tablets by mouth 3 (three) times daily as needed for moderate pain or severe pain. 01/30/17   Waynetta Pean, PA-C  lubiprostone (AMITIZA) 24 MCG capsule Take 1 capsule (24 mcg total) by mouth 2 (two) times daily with a meal. 03/16/16   Lacie Draft M, PA-C  methocarbamol (ROBAXIN) 500 MG tablet Take 1 tablet (500 mg total) by  mouth 3 (three) times daily as needed for muscle spasms. 03/13/16   Melina Schools, MD  naproxen (NAPROSYN) 500 MG tablet Take 1 tablet (500 mg total) by mouth 2 (two) times daily with a meal. 01/30/17   Waynetta Pean, PA-C  omeprazole (PRILOSEC) 20 MG capsule Take 20 mg by mouth daily as needed.     [provider]  ondansetron (ZOFRAN) 4 MG tablet Take 1 tablet (4 mg total) by mouth every 8 (eight) hours as needed for nausea or vomiting. 03/13/16   Melina Schools, MD  oxyCODONE-acetaminophen (PERCOCET) 10-325 MG tablet Take 1 tablet by mouth every 4 (four) hours as needed for pain. 03/13/16   Melina Schools, MD    Family History Family History  Problem Relation Age of Onset  . Hypertension Father   . Fibromyalgia Father   . Depression Father   . Cancer Father   . Bipolar disorder Brother   . Fibromyalgia Brother     Social History Social History  Substance Use Topics  . Smoking status: Former Smoker    Packs/day: 2.00    Years: 3.00    Types: Cigarettes    Quit date: 10/03/2013  . Smokeless tobacco: Never Used  . Alcohol use No     Allergies   Other and Oxycodone   Review of Systems Review of Systems  Constitutional: Negative for chills and fever.  HENT: Negative for congestion and sore throat.   Eyes: Negative for visual disturbance.  Respiratory: Negative for cough and shortness of breath.   Cardiovascular: Negative for chest pain.  Gastrointestinal: Negative for abdominal pain, nausea and vomiting.  Genitourinary: Negative for dysuria.  Musculoskeletal: Positive for arthralgias and back pain. Negative for neck pain.  Skin: Negative for rash.  Neurological: Negative for syncope, weakness, numbness and headaches.       Tingling.      Physical Exam Updated Vital Signs BP 113/77   Pulse 78   Temp 97.9 F (36.6 C) (Oral)   Resp 14   LMP 01/16/2017   SpO2 98%   Physical Exam  Constitutional: She is oriented to person, place, and time. She appears  well-developed and well-nourished. No distress.  Nontoxic appearing.  HENT:  Head: Normocephalic and atraumatic.  Mouth/Throat: Oropharynx is clear and moist.  Eyes: Right eye exhibits no discharge. Left eye exhibits no discharge.  Neck: Neck supple.  Cardiovascular: Normal rate, regular rhythm, normal heart sounds and intact distal pulses.  Exam reveals no gallop and no friction rub.   No murmur heard. Bilateral radial and posterior tibialis pulses are intact.  Pulmonary/Chest: Effort normal and breath sounds normal. No respiratory distress. She has no wheezes. She has no rales.  Abdominal: Soft. There is no tenderness. There is no guarding.  Musculoskeletal: Normal range of motion. She exhibits tenderness. She exhibits no edema  or deformity.  Patient has tenderness throughout her lumbar spine, anterior right lateral low back musculature. She also has some right lateral hip tenderness to palpation. No pelvic instability. Good ROM at her right hip.   Lymphadenopathy:    She has no cervical adenopathy.  Neurological: She is alert and oriented to person, place, and time. She displays normal reflexes. No sensory deficit. Coordination normal.  Sensation is intact in her bilateral lower extremities. Bilateral patellar DTRs are intact. Antalgic gait. Good strength to her bilateral lower extremities. Good strength with plantar and dorsiflexion bilaterally.  Skin: Skin is warm and dry. Capillary refill takes less than 2 seconds. No rash noted. She is not diaphoretic. No erythema. No pallor.  Psychiatric: She has a normal mood and affect. Her behavior is normal.  Nursing note and vitals reviewed.    ED Treatments / Results  Labs (all labs ordered are listed, but only abnormal results are displayed) Labs Reviewed  CBC WITH DIFFERENTIAL/PLATELET - Abnormal; Notable for the following:       Result Value   WBC 12.7 (*)    Platelets 432 (*)    Monocytes Absolute 1.3 (*)    All other components  within normal limits  BASIC METABOLIC PANEL - Abnormal; Notable for the following:    Glucose, Bld 102 (*)    All other components within normal limits  PROTIME-INR    EKG  EKG Interpretation None       Radiology Mr Lumbar Spine W Wo Contrast  Result Date: 01/29/2017 CLINICAL DATA:  Acute low back pain radiating to RIGHT hip, similar symptoms prior to lumbar fusion less than 1 year ago. Felt a pop in back while walking today. Unable to bear weight. EXAM: MRI LUMBAR SPINE WITHOUT AND WITH CONTRAST TECHNIQUE: Multiplanar and multiecho pulse sequences of the lumbar spine were obtained without and with intravenous contrast. CONTRAST:  5mL MULTIHANCE GADOBENATE DIMEGLUMINE 529 MG/ML IV SOLN COMPARISON:  Lumbar spine radiographs March 14, 2016 and MRI of the lumbar spine November 30, 2013 FINDINGS: SEGMENTATION: For the purposes of this report, the last well-formed intervertebral disc will be described as L5-S1. ALIGNMENT: Maintenance of the lumbar lordosis. No malalignment. VERTEBRAE:Vertebral bodies are intact. Status post L4-5 ALIF, hardware results in local susceptibility artifact. Mild L5-S1 disc height loss with mild desiccation at L3-4 and L5-S1 disc compatible with degenerative discs with mild chronic discogenic endplate changes. No STIR signal abnormality to suggest acute osseous process. No abnormal osseous or disc enhancement. CONUS MEDULLARIS: Conus medullaris terminates at T12-L1 and demonstrates normal morphology and signal characteristics. Cauda equina is normal. No abnormal cord, leptomeningeal or epidural enhancement. PARASPINAL AND SOFT TISSUES: Included prevertebral and paraspinal soft tissues are normal. DISC LEVELS: L1-2 and L2-3: No disc bulge, canal stenosis nor neural foraminal narrowing. L3-4: Similar small LEFT central disc protrusion. Mild facet arthropathy and ligamentum flavum redundancy without canal stenosis or neural foraminal narrowing. L4-5: ALIF, LEFT hemilaminectomy. No  residual disc extrusion. No canal stenosis or neural foraminal narrowing. L5-S1: Slight increased size of small broad-based disc protrusion with new enhancing annular fissure. No canal stenosis or neural foraminal narrowing. IMPRESSION: Slight enlargement of small L5-S1 disc protrusion with new annular fissure. Interval L4-5 ALIF and laminectomy without residual or recurrent extrusion. No fracture or malalignment. No canal stenosis or neural foraminal narrowing. Electronically Signed   By: Elon Alas M.D.   On: 01/29/2017 22:19   Dg Hip Unilat W Or Wo Pelvis 2-3 Views Right  Result Date: 01/29/2017 CLINICAL DATA:  Right lateral hip and right side lumbar pain. EXAM: DG HIP (WITH OR WITHOUT PELVIS) 2-3V RIGHT COMPARISON:  None. FINDINGS: There is no evidence of hip fracture or dislocation. There is no evidence of arthropathy or other focal bone abnormality. Postsurgical changes within the lumbar spine noted from previous hardware fixation. IMPRESSION: Negative. Electronically Signed   By: Kerby Moors M.D.   On: 01/29/2017 19:05    Procedures Procedures (including critical care time)  Medications Ordered in ED Medications  ondansetron (ZOFRAN) injection 4 mg (4 mg Intravenous Given 01/29/17 1814)  morphine 4 MG/ML injection 4 mg (4 mg Intravenous Given 01/29/17 1814)  gadobenate dimeglumine (MULTIHANCE) injection 20 mL (20 mLs Intravenous Contrast Given 01/29/17 2141)  methocarbamol (ROBAXIN) tablet 500 mg (500 mg Oral Given 01/30/17 0014)  morphine 4 MG/ML injection 4 mg (4 mg Intravenous Given 01/30/17 0015)     Initial Impression / Assessment and Plan / ED Course  I have reviewed the triage vital signs and the nursing notes.  Pertinent labs & imaging results that were available during my care of the patient were reviewed by me and considered in my medical decision making (see chart for details).    This is a 36 y.o. Female with a history of lumbar laminectomy who presents to the  emergency department after being sent by her orthopedic surgeon due to hip and back pain. Patient reports she was stepping out of her vehicle this afternoon onto uneven ground when she jerked her body sideways causing her to have pain to her hip and her right back. She did not fall to the ground. Patient reports she has some pain to her right back throughout her right leg chronically, however now this has worsened. She reports popping sensation to her right hip with walking. She reports increased tingling down her leg. Denies loss of bowel or bladder control. She spoke with her orthopedic surgeon Dr. Rolena Infante who sent her to the emergency department for an MRI with contrast. She reports feeling better after pain medication in triage. On exam patient is afebrile nontoxic appearing. She does have tenderness across her lumbar spine and right lateral low back musculature. She has no focal neurological deficits. She has antalgic gait. Bilateral patellar DTRs are intact. Right hip with pelvis x-ray is unremarkable. No fracture. MRI with contrast shows slight enlargement of a small L5-S1 disc protrusion with new annular fissure. No fracture or malalignment. No canal stenosis.  I called and spoke with her orthopedic surgeon Dr. Rolena Infante. He would like her to be discharged today with Norco 3 times a day for a week and have her follow-up next week with his PA in the office. I discussed this plan with the patient and she agrees. We also discussed using Naprosyn for baseline pain control and Norco for breakthrough pain. Patient agrees with plan. I discussed return precautions. I advised the patient to follow-up with their primary care provider this week. I advised the patient to return to the emergency department with new or worsening symptoms or new concerns. The patient verbalized understanding and agreement with plan.      Final Clinical Impressions(s) / ED Diagnoses   Final diagnoses:  Low back pain  Protrusion of  lumbar intervertebral disc  Right hip pain    New Prescriptions New Prescriptions   HYDROCODONE-ACETAMINOPHEN (NORCO/VICODIN) 5-325 MG TABLET    Take 2 tablets by mouth 3 (three) times daily as needed for moderate pain or severe pain.   NAPROXEN (NAPROSYN) 500 MG TABLET  Take 1 tablet (500 mg total) by mouth 2 (two) times daily with a meal.     Waynetta Pean, PA-C 01/30/17 1771    Rolland Porter, MD 01/30/17 [id number]

## 2017-03-27 ENCOUNTER — Other Ambulatory Visit: Payer: Self-pay | Admitting: Physician Assistant

## 2017-03-27 DIAGNOSIS — Z981 Arthrodesis status: Secondary | ICD-10-CM

## 2017-04-03 ENCOUNTER — Inpatient Hospital Stay
Admission: RE | Admit: 2017-04-03 | Discharge: 2017-04-03 | Disposition: A | Discharge status: Home / Self Care | Payer: Self-pay | Source: Ambulatory Visit | Attending: Physician Assistant | Admitting: Physician Assistant

## 2017-04-27 ENCOUNTER — Emergency Department (INDEPENDENT_AMBULATORY_CARE_PROVIDER_SITE_OTHER)
Admission: EM | Admit: 2017-04-27 | Discharge: 2017-04-27 | Disposition: A | Discharge status: Home / Self Care | Payer: Medicaid Other | Source: Home / Self Care

## 2017-04-27 ENCOUNTER — Encounter: Payer: Self-pay | Admitting: Emergency Medicine

## 2017-04-27 DIAGNOSIS — J40 Bronchitis, not specified as acute or chronic: Secondary | ICD-10-CM | POA: Diagnosis not present

## 2017-04-27 DIAGNOSIS — J0100 Acute maxillary sinusitis, unspecified: Secondary | ICD-10-CM

## 2017-04-27 MED ORDER — AMOXICILLIN 500 MG PO CAPS: 500.0000 mg | ORAL_CAPSULE | Freq: Three times a day (TID) | ORAL | 0 refills | Status: DC

## 2017-04-27 MED ORDER — ALBUTEROL SULFATE HFA 108 (90 BASE) MCG/ACT IN AERS: 2.0000 | INHALATION_SPRAY | RESPIRATORY_TRACT | 0 refills | Status: DC | PRN

## 2017-04-27 NOTE — ED Triage Notes (Signed)
Patient presents to [facility] with C/O cough , cold, nasal congestion mild sore throat, headache, generalized weakness. Symptoms since Friday.

## 2017-04-27 NOTE — ED Provider Notes (Signed)
[facility]    CSN: [id number] Arrival date & time: 04/27/17  1628     History   Chief Complaint Chief Complaint  Patient presents with  . Cough  . Nasal Congestion  . Otalgia    HPI Stacy Deleon is a 36 y.o. female.   The history is provided by the patient. No language interpreter was used.  Cough  Cough characteristics:  Productive Sputum characteristics:  [place] Severity:  Moderate Onset quality:  Gradual Duration:  3 days Timing:  Constant Progression:  Worsening Chronicity:  New Smoker: yes   Context: upper respiratory infection   Relieved by:  Nothing Worsened by:  Nothing Ineffective treatments:  None tried Associated symptoms: ear pain   Risk factors: no chemical exposure   Otalgia  Associated symptoms: cough   Pt reports she had same the past 2 years.  Pt reports she had pneumonia.  Pt reports she starts with bronchitis but then gets pneumonia.  Pt is trying to stop smoking  Past Medical History:  Diagnosis Date  . ADHD (attention deficit hyperactivity disorder)   . Anxiety   . Bronchitis   . Depression   . GERD (gastroesophageal reflux disease)   . Headache   . Incontinence of urine   . Neuromuscular disorder (HCC)    MIGRAINES   . Polycystic ovary syndrome   . PONV (postoperative nausea and vomiting)     Patient Active Problem List   Diagnosis Date Noted  . Back pain 03/13/2016  . 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 ADD (attention deficit disorder) 05/24/2014  . DDD (degenerative disc disease), lumbar 05/24/2014  . Routine general medical examination at a health care facility 05/24/2014  . Affective disorder ([place]) 05/24/2014  . Depression, neurotic 04/06/2012  . Personal history of tobacco use, presenting hazards to health 04/06/2012  . Bilateral polycystic ovarian syndrome 03/31/2012  . Adiposity 09/05/2011    Past Surgical History:  Procedure Laterality Date  . ABDOMINAL EXPOSURE N/A 03/13/2016   Procedure:  ABDOMINAL EXPOSURE;  Surgeon: Rosetta Posner, MD;  Location: [facility] OR;  Service: Vascular;  Laterality: N/A;  . ANTERIOR LUMBAR FUSION N/A 03/13/2016   Procedure: ANTERIOR LUMBAR FUSION L4-L5    (1 LEVEL)  ALIF;  Surgeon: Melina Schools, MD;  Location: [place];  Service: Orthopedics;  Laterality: N/A;  . HERNIA REPAIR    . LUMBAR LAMINECTOMY/DECOMPRESSION MICRODISCECTOMY Left 10/13/2014   Procedure: LEFT L4-L5 DISCECTOMY    (1 LEVEL);  Surgeon: Melina Schools, MD;  Location: [place];  Service: Orthopedics;  Laterality: Left;  . PLANTAR FASCIA SURGERY    . TUBAL LIGATION      OB History    Gravida Para Term Preterm AB Living   3 3 3     3    SAB TAB Ectopic Multiple Live Births                   Home Medications    Prior to Admission medications   Medication Sig Start Date End Date Taking? Authorizing Provider  albuterol (PROVENTIL HFA;VENTOLIN HFA) 108 (90 Base) MCG/ACT inhaler Inhale 2 puffs into the lungs every 4 (four) hours as needed for wheezing or shortness of breath. 04/27/17   Fransico Meadow, PA-C  amoxicillin (AMOXIL) 500 MG capsule Take 1 capsule (500 mg total) by mouth 3 (three) times daily. 04/27/17   Fransico Meadow, PA-C  diclofenac sodium (VOLTAREN) 1 % GEL Apply 2 g topically 4 (four) times daily. 03/16/16   Phoebe Sharps,  Conley Rolls, PA-C  enoxaparin (LOVENOX) 40 MG/0.4ML injection Inject 0.4 mLs (40 mg total) into the skin daily. 03/13/16   Melina Schools, MD  HYDROcodone-acetaminophen (NORCO/VICODIN) 5-325 MG tablet Take 2 tablets by mouth 3 (three) times daily as needed for moderate pain or severe pain. 01/30/17   Waynetta Pean, PA-C  lubiprostone (AMITIZA) 24 MCG capsule Take 1 capsule (24 mcg total) by mouth 2 (two) times daily with a meal. 03/16/16   Lacie Draft M, PA-C  methocarbamol (ROBAXIN) 500 MG tablet Take 1 tablet (500 mg total) by mouth 3 (three) times daily as needed for muscle spasms. 03/13/16   Melina Schools, MD  naproxen (NAPROSYN) 500 MG tablet Take 1 tablet (500 mg  total) by mouth 2 (two) times daily with a meal. 01/30/17   Waynetta Pean, PA-C  omeprazole (PRILOSEC) 20 MG capsule Take 20 mg by mouth daily as needed.     [provider]  ondansetron (ZOFRAN) 4 MG tablet Take 1 tablet (4 mg total) by mouth every 8 (eight) hours as needed for nausea or vomiting. 03/13/16   Melina Schools, MD  oxyCODONE-acetaminophen (PERCOCET) 10-325 MG tablet Take 1 tablet by mouth every 4 (four) hours as needed for pain. 03/13/16   Melina Schools, MD    Family History Family History  Problem Relation Age of Onset  . Hypertension Father   . Fibromyalgia Father   . Depression Father   . Cancer Father   . Bipolar disorder Brother   . Fibromyalgia Brother     Social History Social History  Substance Use Topics  . Smoking status: Former Smoker    Packs/day: 2.00    Years: 3.00    Types: Cigarettes    Quit date: 10/03/2013  . Smokeless tobacco: Never Used  . Alcohol use No     Allergies   Other and Oxycodone   Review of Systems Review of Systems  HENT: Positive for ear pain.   Respiratory: Positive for cough.   All other systems reviewed and are negative.    Physical Exam Triage Vital Signs ED Triage Vitals  Enc Vitals Group     BP 04/27/17 1657 108/77     Pulse Rate 04/27/17 1657 89     Resp 04/27/17 1657 16     Temp 04/27/17 1657 98.1 F (36.7 C)     Temp Source 04/27/17 1657 Oral     SpO2 04/27/17 1657 98 %     Weight 04/27/17 1658 229 lb (103.9 kg)     Height 04/27/17 1658 5\' 6"  (1.676 m)     Head Circumference --      Peak Flow --      Pain Score 04/27/17 1658 6     Pain Loc --      Pain Edu? --      Excl. in [place]? --    No data found.   Updated Vital Signs BP 108/77 (BP Location: Left Arm)   Pulse 89   Temp 98.1 F (36.7 C) (Oral)   Resp 16   Ht 5\' 6"  (1.676 m)   Wt 229 lb (103.9 kg)   LMP 04/18/2017   SpO2 98%   BMI 36.96 kg/m   Visual Acuity Right Eye Distance:   Left Eye Distance:   Bilateral Distance:     Right Eye Near:   Left Eye Near:    Bilateral Near:     Physical Exam  Constitutional: She is oriented to person, place, and time. She appears well-developed and  well-nourished. No distress.  HENT:  Head: Normocephalic and atraumatic.  Mouth/Throat: Posterior oropharyngeal erythema present.  Tender maxillary sinuses,  Eyes: Pupils are equal, round, and reactive to light. Conjunctivae and EOM are normal.  Neck: Normal range of motion. Neck supple.  Cardiovascular: Normal rate and regular rhythm.   No murmur heard. Pulmonary/Chest: Effort normal. No respiratory distress. She has wheezes.  Abdominal: Soft. There is no tenderness.  Musculoskeletal: Normal range of motion. She exhibits no edema.  Neurological: She is alert and oriented to person, place, and time.  Skin: Skin is warm and dry.  Psychiatric: She has a normal mood and affect.  Nursing note and vitals reviewed.    UC Treatments / Results  Labs (all labs ordered are listed, but only abnormal results are displayed) Labs Reviewed - No data to display  EKG  EKG Interpretation None       Radiology No results found.  Procedures Procedures (including critical care time)  Medications Ordered in UC Medications - No data to display   Initial Impression / Assessment and Plan / UC Course  I have reviewed the triage vital signs and the nursing notes.  Pertinent labs & imaging results that were available during my care of the patient were reviewed by me and considered in my medical decision making (see chart for details).     Pt counseled on need to stop smoking.  I will treat with amoxicillian and albuterol  I doubt current pneumonia.   Final Clinical Impressions(s) / UC Diagnoses   Final diagnoses:  Bronchitis  Acute non-recurrent maxillary sinusitis    New Prescriptions Discharge Medication List as of 04/27/2017  5:14 PM    START taking these medications   Details  albuterol (PROVENTIL HFA;VENTOLIN  HFA) 108 (90 Base) MCG/ACT inhaler Inhale 2 puffs into the lungs every 4 (four) hours as needed for wheezing or shortness of breath., [place] 04/27/2017, Normal    amoxicillin (AMOXIL) 500 MG capsule Take 1 capsule (500 mg total) by mouth 3 (three) times daily., Starting Sun 04/27/2017, Normal       An After Visit Summary was printed and given to the patient.  Controlled Substance Prescriptions [place] Controlled Substance Registry consulted? Not Applicable   Fransico Meadow, [place] 04/27/17 5974

## 2018-06-22 ENCOUNTER — Emergency Department (HOSPITAL_BASED_OUTPATIENT_CLINIC_OR_DEPARTMENT_OTHER)
Admission: EM | Admit: 2018-06-22 | Discharge: 2018-06-22 | Disposition: A | Discharge status: Home / Self Care | Payer: 59 | Attending: Emergency Medicine | Admitting: Emergency Medicine

## 2018-06-22 ENCOUNTER — Encounter: Payer: Self-pay | Admitting: Emergency Medicine

## 2018-06-22 ENCOUNTER — Other Ambulatory Visit: Payer: Self-pay

## 2018-06-22 ENCOUNTER — Emergency Department (HOSPITAL_BASED_OUTPATIENT_CLINIC_OR_DEPARTMENT_OTHER): Payer: 59

## 2018-06-22 DIAGNOSIS — F909 Attention-deficit hyperactivity disorder, unspecified type: Secondary | ICD-10-CM | POA: Diagnosis not present

## 2018-06-22 DIAGNOSIS — F329 Major depressive disorder, single episode, unspecified: Secondary | ICD-10-CM | POA: Insufficient documentation

## 2018-06-22 DIAGNOSIS — Y9389 Activity, other specified: Secondary | ICD-10-CM | POA: Insufficient documentation

## 2018-06-22 DIAGNOSIS — S99922A Unspecified injury of left foot, initial encounter: Secondary | ICD-10-CM | POA: Diagnosis present

## 2018-06-22 DIAGNOSIS — W0110XA Fall on same level from slipping, tripping and stumbling with subsequent striking against unspecified object, initial encounter: Secondary | ICD-10-CM | POA: Insufficient documentation

## 2018-06-22 DIAGNOSIS — Z79899 Other long term (current) drug therapy: Secondary | ICD-10-CM | POA: Insufficient documentation

## 2018-06-22 DIAGNOSIS — S93602A Unspecified sprain of left foot, initial encounter: Secondary | ICD-10-CM | POA: Insufficient documentation

## 2018-06-22 DIAGNOSIS — F419 Anxiety disorder, unspecified: Secondary | ICD-10-CM | POA: Diagnosis not present

## 2018-06-22 DIAGNOSIS — Z87891 Personal history of nicotine dependence: Secondary | ICD-10-CM | POA: Insufficient documentation

## 2018-06-22 DIAGNOSIS — Y998 Other external cause status: Secondary | ICD-10-CM | POA: Diagnosis not present

## 2018-06-22 DIAGNOSIS — Y92008 Other place in unspecified non-institutional (private) residence as the place of occurrence of the external cause: Secondary | ICD-10-CM | POA: Diagnosis not present

## 2018-06-22 MED ORDER — ACETAMINOPHEN 500 MG PO TABS
1000.0000 mg | ORAL_TABLET | Freq: Once | ORAL | Status: AC
  Administered 2018-06-22: 1000 mg via ORAL
  Filled 2018-06-22: qty 2

## 2018-06-22 NOTE — ED Provider Notes (Signed)
[place] EMERGENCY DEPARTMENT Provider Note   CSN: [id number] Arrival date & time: 06/22/18  4235     History   Chief Complaint Chief Complaint  Patient presents with  . Ankle Pain    HPI Stacy Deleon is a 37 y.o. female.  38-year-old female with past medical history below who presents with left foot pain after fall.  Last night while she was taking out the trash, she tripped and fell, landing on her left heel and bumping the outside of her left ankle.  Since then, she has had constant, moderate to severe pain predominantly in her heel and also throughout her foot.  Pain is worse with weightbearing.  Took ibuprofen at 7:30 AM.  The history is provided by the patient.  Ankle Pain   Pertinent negatives include no numbness.    Past Medical History:  Diagnosis Date  . ADHD (attention deficit hyperactivity disorder)   . Anxiety   . Bronchitis   . Depression   . GERD (gastroesophageal reflux disease)   . Headache   . Incontinence of urine   . Neuromuscular disorder (HCC)    MIGRAINES   . Polycystic ovary syndrome   . PONV (postoperative nausea and vomiting)     Patient Active Problem List   Diagnosis Date Noted  . Back pain 03/13/2016  . 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 ADD (attention deficit disorder) 05/24/2014  . DDD (degenerative disc disease), lumbar 05/24/2014  . Routine general medical examination at a health care facility 05/24/2014  . Affective disorder ([place]) 05/24/2014  . Depression, neurotic 04/06/2012  . Personal history of tobacco use, presenting hazards to health 04/06/2012  . Bilateral polycystic ovarian syndrome 03/31/2012  . Adiposity 09/05/2011    Past Surgical History:  Procedure Laterality Date  . ABDOMINAL EXPOSURE N/A 03/13/2016   Procedure: ABDOMINAL EXPOSURE;  Surgeon: Rosetta Posner, MD;  Location: [place];  Service: Vascular;  Laterality: N/A;  . ABDOMINAL HYSTERECTOMY    . ANTERIOR LUMBAR FUSION N/A 03/13/2016   Procedure: ANTERIOR LUMBAR FUSION L4-L5    (1 LEVEL)  ALIF;  Surgeon: Melina Schools, MD;  Location: [place];  Service: Orthopedics;  Laterality: N/A;  . HERNIA REPAIR    . LUMBAR LAMINECTOMY/DECOMPRESSION MICRODISCECTOMY Left 10/13/2014   Procedure: LEFT L4-L5 DISCECTOMY    (1 LEVEL);  Surgeon: Melina Schools, MD;  Location: [place];  Service: Orthopedics;  Laterality: Left;  . PLANTAR FASCIA SURGERY    . TUBAL LIGATION       OB History    Gravida  3   Para  3   Term  3   Preterm      AB      Living  3     SAB      TAB      Ectopic      Multiple      Live Births               Home Medications    Prior to Admission medications   Medication Sig Start Date End Date Taking? Authorizing Provider  albuterol (PROVENTIL HFA;VENTOLIN HFA) 108 (90 Base) MCG/ACT inhaler Inhale 2 puffs into the lungs every 4 (four) hours as needed for wheezing or shortness of breath. 04/27/17   Fransico Meadow, PA-C  amoxicillin (AMOXIL) 500 MG capsule Take 1 capsule (500 mg total) by mouth 3 (three) times daily. 04/27/17   Fransico Meadow, PA-C  diclofenac sodium (VOLTAREN) 1 % GEL Apply 2 g  topically 4 (four) times daily. 03/16/16   Cecilie Kicks, PA-C  enoxaparin (LOVENOX) 40 MG/0.4ML injection Inject 0.4 mLs (40 mg total) into the skin daily. 03/13/16   Melina Schools, MD  HYDROcodone-acetaminophen (NORCO/VICODIN) 5-325 MG tablet Take 2 tablets by mouth 3 (three) times daily as needed for moderate pain or severe pain. 01/30/17   Waynetta Pean, PA-C  lubiprostone (AMITIZA) 24 MCG capsule Take 1 capsule (24 mcg total) by mouth 2 (two) times daily with a meal. 03/16/16   Lacie Draft M, PA-C  methocarbamol (ROBAXIN) 500 MG tablet Take 1 tablet (500 mg total) by mouth 3 (three) times daily as needed for muscle spasms. 03/13/16   Melina Schools, MD  naproxen (NAPROSYN) 500 MG tablet Take 1 tablet (500 mg total) by mouth 2 (two) times daily with a meal. 01/30/17   Waynetta Pean, PA-C  omeprazole  (PRILOSEC) 20 MG capsule Take 20 mg by mouth daily as needed.     [provider]  ondansetron (ZOFRAN) 4 MG tablet Take 1 tablet (4 mg total) by mouth every 8 (eight) hours as needed for nausea or vomiting. 03/13/16   Melina Schools, MD  oxyCODONE-acetaminophen (PERCOCET) 10-325 MG tablet Take 1 tablet by mouth every 4 (four) hours as needed for pain. 03/13/16   Melina Schools, MD  sertraline (ZOLOFT) 100 MG tablet Take 100 mg by mouth daily.    11/11/11  [provider]    Family History Family History  Problem Relation Age of Onset  . Hypertension Father   . Fibromyalgia Father   . Depression Father   . Cancer Father   . Bipolar disorder Brother   . Fibromyalgia Brother     Social History Social History   Tobacco Use  . Smoking status: Former Smoker    Packs/day: 2.00    Years: 3.00    Pack years: 6.00    Types: Cigarettes    Last attempt to quit: 10/03/2013    Years since quitting: 4.7  . Smokeless tobacco: Never Used  Substance Use Topics  . Alcohol use: No  . Drug use: No     Allergies   Other and Oxycodone   Review of Systems Review of Systems  Musculoskeletal: Positive for arthralgias.  Skin: Negative for wound.  Neurological: Negative for numbness.     Physical Exam Updated Vital Signs BP 107/75 (BP Location: Left Arm)   Pulse 78   Temp 98.1 F (36.7 C) (Oral)   Resp 20   LMP 04/18/2017   SpO2 98%   Physical Exam  Constitutional: She is oriented to person, place, and time. She appears well-developed and well-nourished. No distress.  HENT:  Head: Normocephalic and atraumatic.  Eyes: Conjunctivae are normal.  Neck: Neck supple.  Musculoskeletal: Normal range of motion. She exhibits tenderness. She exhibits no deformity.  Left lower ext: Achilles tendon intact, no proximal fibular tenderness, no base of 5th metatarsal tenderness, no midfoot instability Tenderness generally on L heel and lateral malleolus without swelling or  ecchymosis   Neurological: She is alert and oriented to person, place, and time. No sensory deficit.  Normal sensation b/l lower extremities  Skin: Skin is warm and dry. No erythema.  Psychiatric: She has a normal mood and affect. Judgment normal.  Nursing note and vitals reviewed.    ED Treatments / Results  Labs (all labs ordered are listed, but only abnormal results are displayed) Labs Reviewed - No data to display  EKG None  Radiology Dg Ankle Complete Left  Result Date: 06/22/2018 CLINICAL DATA:  37 year old female with left ankle and foot pain. Recent fall. EXAM: LEFT ANKLE COMPLETE - 3+ VIEW COMPARISON:  Concurrently obtained radiographs of the left foot FINDINGS: There is no evidence of fracture, dislocation, or joint effusion. There is no evidence of arthropathy or other focal bone abnormality. Soft tissues are unremarkable. IMPRESSION: Negative. Electronically Signed   By: Jacqulynn Cadet M.D.   On: 06/22/2018 09:47   Dg Foot Complete Left  Result Date: 06/22/2018 CLINICAL DATA:  37 year old female with history of tenderness and swelling in the left foot after a fall yesterday evening. EXAM: LEFT FOOT - COMPLETE 3+ VIEW COMPARISON:  No priors. FINDINGS: There is no evidence of fracture or dislocation. There is no evidence of arthropathy or other focal bone abnormality. Soft tissues are unremarkable. IMPRESSION: Negative. Electronically Signed   By: Stacy Deleon M.D.   On: 06/22/2018 09:47    Procedures Procedures (including critical care time)  Medications Ordered in ED Medications  acetaminophen (TYLENOL) tablet 1,000 mg (1,000 mg Oral Given 06/22/18 1015)     Initial Impression / Assessment and Plan / ED Course  I have reviewed the triage vital signs and the nursing notes.  Pertinent  imaging results that were available during my care of the patient were reviewed by me and considered in my medical decision making (see chart for details).    XR negative.  Suspect sprain and contusion.  Cuts were measures, provided with ankle brace and crutches to use for comfort, instructed to follow-up with PCP if no improvement in 1 to 2 weeks.  Final Clinical Impressions(s) / ED Diagnoses   Final diagnoses:  Sprain of left foot, initial encounter    ED Discharge Orders    None       Victorhugo Preis, Wenda Overland, MD 06/22/18 1058

## 2018-06-22 NOTE — ED Triage Notes (Signed)
Pt states fell last night at home, c/o lt heel and ankle pain

## 2018-06-22 NOTE — ED Notes (Signed)
Pt states that she took 400mg  of ibuprofen at 0730 this morning

## 2019-08-02 ENCOUNTER — Encounter (HOSPITAL_BASED_OUTPATIENT_CLINIC_OR_DEPARTMENT_OTHER): Payer: Self-pay | Admitting: *Deleted

## 2019-08-02 ENCOUNTER — Emergency Department (HOSPITAL_BASED_OUTPATIENT_CLINIC_OR_DEPARTMENT_OTHER)
Admission: EM | Admit: 2019-08-02 | Discharge: 2019-08-02 | Disposition: A | Discharge status: Home / Self Care | Payer: 59 | Attending: Emergency Medicine | Admitting: Emergency Medicine

## 2019-08-02 ENCOUNTER — Other Ambulatory Visit: Payer: Self-pay

## 2019-08-02 DIAGNOSIS — R5383 Other fatigue: Secondary | ICD-10-CM | POA: Diagnosis present

## 2019-08-02 DIAGNOSIS — Z5321 Procedure and treatment not carried out due to patient leaving prior to being seen by health care provider: Secondary | ICD-10-CM | POA: Insufficient documentation

## 2019-08-02 NOTE — ED Triage Notes (Signed)
Fatigue, sore throat, body aches, muscle pain, headache, cough, sob, red eyes, and runny nose.

## 2019-09-13 ENCOUNTER — Other Ambulatory Visit: Payer: Self-pay

## 2019-09-13 ENCOUNTER — Encounter (HOSPITAL_BASED_OUTPATIENT_CLINIC_OR_DEPARTMENT_OTHER): Payer: Self-pay

## 2019-09-13 ENCOUNTER — Emergency Department (HOSPITAL_BASED_OUTPATIENT_CLINIC_OR_DEPARTMENT_OTHER)
Admission: EM | Admit: 2019-09-13 | Discharge: 2019-09-13 | Disposition: A | Discharge status: Home / Self Care | Payer: 59 | Attending: Emergency Medicine | Admitting: Emergency Medicine

## 2019-09-13 ENCOUNTER — Other Ambulatory Visit: Payer: 59

## 2019-09-13 DIAGNOSIS — Z20822 Contact with and (suspected) exposure to covid-19: Secondary | ICD-10-CM | POA: Insufficient documentation

## 2019-09-13 DIAGNOSIS — R519 Headache, unspecified: Secondary | ICD-10-CM | POA: Diagnosis not present

## 2019-09-13 DIAGNOSIS — R0981 Nasal congestion: Secondary | ICD-10-CM | POA: Insufficient documentation

## 2019-09-13 DIAGNOSIS — Z5321 Procedure and treatment not carried out due to patient leaving prior to being seen by health care provider: Secondary | ICD-10-CM | POA: Diagnosis not present

## 2019-09-13 LAB — SARS CORONAVIRUS 2 AG (30 MIN TAT): SARS Coronavirus 2 Ag: NEGATIVE

## 2019-09-13 LAB — SARS CORONAVIRUS 2 (TAT 6-24 HRS): SARS Coronavirus 2: NEGATIVE

## 2019-09-13 MED ORDER — ACETAMINOPHEN 500 MG PO TABS
1000.0000 mg | ORAL_TABLET | Freq: Once | ORAL | Status: AC
  Administered 2019-09-13: 1000 mg via ORAL

## 2019-09-13 NOTE — ED Triage Notes (Signed)
Pt reports that she has been exposed to [employer] at work, has been having no taste or smell, or appetite. Has also had some diarrhea and nasal congestion.

## 2019-09-15 ENCOUNTER — Telehealth: Payer: Self-pay

## 2019-09-15 NOTE — Telephone Encounter (Signed)
Pt notified of negative COVID-19 results. Understanding verbalized.  Stacy Deleon   

## 2021-07-03 ENCOUNTER — Emergency Department (HOSPITAL_COMMUNITY): Admission: EM | Admit: 2021-07-03 | Discharge: 2021-07-03 | Discharge status: Against Medical Advice | Payer: 59

## 2021-07-03 NOTE — ED Notes (Signed)
Called pt for vitals for triage, no response.

## 2021-07-03 NOTE — ED Notes (Signed)
Called for triage and unable to locate

## 2021-08-30 ENCOUNTER — Encounter: Payer: Self-pay | Admitting: Neurology

## 2021-09-04 ENCOUNTER — Ambulatory Visit: Payer: Self-pay | Admitting: Neurology

## 2021-09-04 ENCOUNTER — Encounter: Payer: Self-pay | Admitting: Neurology

## 2021-10-03 DIAGNOSIS — H469 Unspecified optic neuritis: Secondary | ICD-10-CM

## 2021-10-03 HISTORY — DX: Unspecified optic neuritis: H46.9

## 2022-03-17 ENCOUNTER — Other Ambulatory Visit: Payer: Self-pay

## 2022-03-17 ENCOUNTER — Emergency Department (HOSPITAL_COMMUNITY)
Admission: EM | Admit: 2022-03-17 | Discharge: 2022-03-17 | Disposition: A | Discharge status: Home / Self Care | Payer: Self-pay | Attending: Emergency Medicine | Admitting: Emergency Medicine

## 2022-03-17 ENCOUNTER — Encounter (HOSPITAL_COMMUNITY): Payer: Self-pay | Admitting: Emergency Medicine

## 2022-03-17 ENCOUNTER — Emergency Department (HOSPITAL_COMMUNITY): Payer: Self-pay

## 2022-03-17 DIAGNOSIS — W010XXA Fall on same level from slipping, tripping and stumbling without subsequent striking against object, initial encounter: Secondary | ICD-10-CM | POA: Insufficient documentation

## 2022-03-17 DIAGNOSIS — M25572 Pain in left ankle and joints of left foot: Secondary | ICD-10-CM | POA: Insufficient documentation

## 2022-03-17 DIAGNOSIS — Y92007 Garden or yard of unspecified non-institutional (private) residence as the place of occurrence of the external cause: Secondary | ICD-10-CM | POA: Insufficient documentation

## 2022-03-17 DIAGNOSIS — S82892A Other fracture of left lower leg, initial encounter for closed fracture: Secondary | ICD-10-CM | POA: Insufficient documentation

## 2022-03-17 MED ORDER — HYDROMORPHONE HCL 1 MG/ML IJ SOLN
1.0000 mg | Freq: Once | INTRAMUSCULAR | Status: AC
  Administered 2022-03-17: 1 mg via INTRAVENOUS
  Filled 2022-03-17: qty 1

## 2022-03-17 MED ORDER — DIPHENHYDRAMINE HCL 50 MG/ML IJ SOLN
25.0000 mg | Freq: Once | INTRAMUSCULAR | Status: AC
Start: 2022-03-17 — End: 2022-03-17
  Administered 2022-03-17: 25 mg via INTRAVENOUS
  Filled 2022-03-17: qty 1

## 2022-03-17 MED ORDER — OXYCODONE-ACETAMINOPHEN 5-325 MG PO TABS: 1.0000 | ORAL_TABLET | Freq: Four times a day (QID) | ORAL | 0 refills | Status: DC | PRN

## 2022-03-17 MED ORDER — DIPHENHYDRAMINE HCL 50 MG/ML IJ SOLN
25.0000 mg | Freq: Once | INTRAMUSCULAR | Status: AC
  Administered 2022-03-17: 25 mg via INTRAVENOUS
  Filled 2022-03-17: qty 1

## 2022-03-17 MED ORDER — ONDANSETRON HCL 4 MG/2ML IJ SOLN
4.0000 mg | Freq: Once | INTRAMUSCULAR | Status: AC
  Administered 2022-03-17: 4 mg via INTRAVENOUS
  Filled 2022-03-17: qty 2

## 2022-03-17 NOTE — Progress Notes (Signed)
Orthopedic Tech Progress Note Patient Details:  CARNELIA OSCAR Feb 03, 1981 [id number]  Ortho Devices Type of Ortho Device: Ace wrap, Stirrup splint, Short leg splint Ortho Device/Splint Location: left reduction Ortho Device/Splint Interventions: Application   Post Interventions Patient Tolerated: Well Instructions Provided: Care of device, Adjustment of device  [place] Pink 03/17/2022, 4:47 PM

## 2022-03-17 NOTE — Discharge Instructions (Addendum)
Call the orthopedic group Monday morning to schedule an appointment.  Do not bear any weight on the affected leg.  Use crutches at all times.  Take the pain medications as written.  Follow-up with orthopedics this week.  However if you get worsening pain fevers or any additional concerns return back to the ER.

## 2022-03-17 NOTE — Progress Notes (Signed)
Orthopedic Tech Progress Note Patient Details:  Stacy Deleon Mar 13, 1981 [id number]  Ortho Devices Type of Ortho Device: Crutches Ortho Device/Splint Location: left reduction Ortho Device/Splint Interventions: Adjustment   Post Interventions Patient Tolerated: Well, Ambulated well Instructions Provided: Care of device, Adjustment of device, Poper ambulation with device  Stacy Deleon 03/17/2022, 5:25 PM

## 2022-03-17 NOTE — ED Provider Notes (Addendum)
[facility] DEPT Provider Note   CSN: [id number] Arrival date & time: 03/17/22  1505     History  Chief Complaint  Patient presents with   Ankle Pain    Stacy Deleon is a 41 y.o. female.  Patient complaining of left ankle pain.  She states that she stepped on something in her yard tripped and fell twisting her left ankle noted a deformity and felt a popping sensation.  This occurred just today prior to arrival.  Denies any injury elsewhere no headache no neck pain no back pain no other extremity pain.  Denies any fevers or cough or vomiting or diarrhea.       Home Medications Prior to Admission medications   Medication Sig Start Date End Date Taking? Authorizing Provider  oxyCODONE-acetaminophen (PERCOCET/ROXICET) 5-325 MG tablet Take 1 tablet by mouth every 6 (six) hours as needed for severe pain. 03/17/22  Yes Luna Fuse, MD  amphetamine-dextroamphetamine (ADDERALL) 10 MG tablet Take by mouth. 07/17/21   [provider]  escitalopram (LEXAPRO) 10 MG tablet TAKE 1 TABLET BY MOUTH DAILY 04/30/19   [provider]  sertraline (ZOLOFT) 100 MG tablet Take 100 mg by mouth daily.    11/11/11  [provider]      Allergies    Other and Oxycodone    Review of Systems   Review of Systems  Constitutional:  Negative for fever.  HENT:  Negative for ear pain.   Eyes:  Negative for pain.  Respiratory:  Negative for cough.   Cardiovascular:  Negative for chest pain.  Gastrointestinal:  Negative for abdominal pain.  Genitourinary:  Negative for flank pain.  Musculoskeletal:  Negative for back pain.  Skin:  Negative for rash.  Neurological:  Negative for headaches.    Physical Exam Updated Vital Signs BP (!) 134/93   Pulse 84   Temp 98.7 F (37.1 C) (Oral)   Resp 18   Ht '5\' 5"'$  (1.651 m)   Wt 99.8 kg   LMP 04/18/2017   SpO2 98%   BMI 36.61 kg/m  Physical Exam Constitutional:      General: She is not in acute  distress.    Appearance: Normal appearance.  HENT:     Head: Normocephalic.     Nose: Nose normal.  Eyes:     Extraocular Movements: Extraocular movements intact.  Cardiovascular:     Rate and Rhythm: Normal rate.  Pulmonary:     Effort: Pulmonary effort is normal.  Musculoskeletal:     Cervical back: Normal range of motion.     Comments: Left ankle swelling or deformity present.  Dorsalis pedis pulses are intact 2+.  No laceration noted.  Compartments are soft.  Tender diffusely across the ankle.  No tenderness to the distal foot or knee.  Extremity neurovascularly intact otherwise.  Neurological:     General: No focal deficit present.     Mental Status: She is alert. Mental status is at baseline.     ED Results / Procedures / Treatments   Labs (all labs ordered are listed, but only abnormal results are displayed) Labs Reviewed - No data to display  EKG None  Radiology DG Ankle 2 Views Left  Result Date: 03/17/2022 CLINICAL DATA:  Reduction left ankle fracture EXAM: LEFT ANKLE - 2 VIEW COMPARISON:  03/17/2022 FINDINGS: Frontal and lateral views of the left ankle demonstrate decreased displacement of the oblique lateral malleolar fracture, with approximately 8 mm of separation of the fracture  fragments. There is improved but persistent lateral translation of the talus relative to the tibial plafond and. Diffuse soft tissue swelling again noted. IMPRESSION: 1. Improved alignment of the lateral malleolar fracture, with decreased but persistent lateral translation of the talus. Electronically Signed   By: Randa Ngo M.D.   On: 03/17/2022 17:41   DG Ankle Complete Left  Result Date: 03/17/2022 CLINICAL DATA:  Tripped and fell, deformity EXAM: LEFT ANKLE COMPLETE - 3+ VIEW; LEFT FOOT - COMPLETE 3+ VIEW; LEFT TIBIA AND FIBULA - 2 VIEW COMPARISON:  12/26/2008 FINDINGS: Left tibia/fibula: Frontal and lateral views are obtained. Displaced distal fibular fracture, with lateral  translation of the talus relative to the tibia. No other acute fractures. Diffuse soft tissue swelling surrounds the ankle. Left ankle: Frontal, oblique, and lateral views are obtained. There is an oblique distal fibular fracture, with marked lateral translation of the talus relative to the tibial plafond and. No frank dislocation. No other acute bony abnormalities. Diffuse soft tissue swelling, greatest anteriorly and laterally. Left foot: Frontal, oblique, and lateral views are obtained. No acute fracture, subluxation, or dislocation. Joint spaces are well preserved. Soft tissues are unremarkable. IMPRESSION: 1. Oblique lateral malleolar fracture, with prominent lateral translation of the talus relative to the tibial plafond. No frank dislocation. 2. Diffuse soft tissue swelling about the ankle. Electronically Signed   By: Randa Ngo M.D.   On: 03/17/2022 16:14   DG Foot Complete Left  Result Date: 03/17/2022 CLINICAL DATA:  Tripped and fell, deformity EXAM: LEFT ANKLE COMPLETE - 3+ VIEW; LEFT FOOT - COMPLETE 3+ VIEW; LEFT TIBIA AND FIBULA - 2 VIEW COMPARISON:  12/26/2008 FINDINGS: Left tibia/fibula: Frontal and lateral views are obtained. Displaced distal fibular fracture, with lateral translation of the talus relative to the tibia. No other acute fractures. Diffuse soft tissue swelling surrounds the ankle. Left ankle: Frontal, oblique, and lateral views are obtained. There is an oblique distal fibular fracture, with marked lateral translation of the talus relative to the tibial plafond and. No frank dislocation. No other acute bony abnormalities. Diffuse soft tissue swelling, greatest anteriorly and laterally. Left foot: Frontal, oblique, and lateral views are obtained. No acute fracture, subluxation, or dislocation. Joint spaces are well preserved. Soft tissues are unremarkable. IMPRESSION: 1. Oblique lateral malleolar fracture, with prominent lateral translation of the talus relative to the tibial  plafond. No frank dislocation. 2. Diffuse soft tissue swelling about the ankle. Electronically Signed   By: Randa Ngo M.D.   On: 03/17/2022 16:14   DG Tibia/Fibula Left  Result Date: 03/17/2022 CLINICAL DATA:  Tripped and fell, deformity EXAM: LEFT ANKLE COMPLETE - 3+ VIEW; LEFT FOOT - COMPLETE 3+ VIEW; LEFT TIBIA AND FIBULA - 2 VIEW COMPARISON:  12/26/2008 FINDINGS: Left tibia/fibula: Frontal and lateral views are obtained. Displaced distal fibular fracture, with lateral translation of the talus relative to the tibia. No other acute fractures. Diffuse soft tissue swelling surrounds the ankle. Left ankle: Frontal, oblique, and lateral views are obtained. There is an oblique distal fibular fracture, with marked lateral translation of the talus relative to the tibial plafond and. No frank dislocation. No other acute bony abnormalities. Diffuse soft tissue swelling, greatest anteriorly and laterally. Left foot: Frontal, oblique, and lateral views are obtained. No acute fracture, subluxation, or dislocation. Joint spaces are well preserved. Soft tissues are unremarkable. IMPRESSION: 1. Oblique lateral malleolar fracture, with prominent lateral translation of the talus relative to the tibial plafond. No frank dislocation. 2. Diffuse soft tissue swelling about the ankle.  Electronically Signed   By: Randa Ngo M.D.   On: 03/17/2022 16:14    Procedures .Ortho Injury Treatment  Date/Time: 03/17/2022 5:51 PM  Performed by: Luna Fuse, MD Authorized by: Luna Fuse, MD  Immobilization: splint Splint type: short leg Splint Applied by: ED Provider Post-procedure neurovascular assessment: post-procedure neurovascularly intact       Medications Ordered in ED Medications  HYDROmorphone (DILAUDID) injection 1 mg (1 mg Intravenous Given 03/17/22 1539)  ondansetron (ZOFRAN) injection 4 mg (4 mg Intravenous Given 03/17/22 1539)  diphenhydrAMINE (BENADRYL) injection 25 mg (25 mg Intravenous Given  03/17/22 1604)  HYDROmorphone (DILAUDID) injection 1 mg (1 mg Intravenous Given 03/17/22 1647)  diphenhydrAMINE (BENADRYL) injection 25 mg (25 mg Intravenous Given 03/17/22 1647)  diphenhydrAMINE (BENADRYL) injection 25 mg (25 mg Intravenous Given 03/17/22 1724)    ED Course/ Medical Decision Making/ A&P                           Medical Decision Making Amount and/or Complexity of Data Reviewed Radiology: ordered.  Risk Prescription drug management.  History obtained from family at bedside.  Cardiac monitoring showing sinus rhythm.  Review of record shows office visit July 17, 2021 office providers.   X-ray of the ankle shows a lateral malleolus fracture of the left ankle, with subluxation of the joint.  Patient given Dilaudid and Benadryl.  Second dose of Dilaudid provided as well.  Case discussed with on-call orthopedist Dr.Adair, recommending splinting and outpatient follow-up in their office this week.  Short leg splint placed by myself with compression to the joint applied.  Patient is neurovascularly intact after placement of splint.  Crutch training provided nonweightbearing status, advised outpatient follow-up with orthopedics this week and to call Monday morning.  Patient expressed understanding discharged home in stable condition.          Final Clinical Impression(s) / ED Diagnoses Final diagnoses:  Closed fracture of left ankle, initial encounter    Rx / DC Orders ED Discharge Orders          Ordered    oxyCODONE-acetaminophen (PERCOCET/ROXICET) 5-325 MG tablet  Every 6 hours PRN        03/17/22 1750              Luna Fuse, MD 03/17/22 1750    Luna Fuse, MD 03/17/22 1751

## 2022-03-17 NOTE — ED Triage Notes (Signed)
Pt to ER via EMS from home.  Pt was working in yard when she tripped and fell injuring left ankle. Noted deformity, PMS intact.  EMS administered 16mg fentanyl and '4mg'$  zofran.

## 2022-03-19 ENCOUNTER — Other Ambulatory Visit: Payer: Self-pay | Admitting: Orthopaedic Surgery

## 2022-03-25 ENCOUNTER — Other Ambulatory Visit: Payer: Self-pay

## 2022-03-25 ENCOUNTER — Encounter (HOSPITAL_COMMUNITY): Payer: Self-pay | Admitting: Orthopaedic Surgery

## 2022-03-25 NOTE — Pre-Procedure Instructions (Signed)
SDW CALL  Patient was given pre-op instructions over the phone. The opportunity was given for the patient to ask questions. No further questions asked. Patient verbalized understanding of instructions given.   PCP - Nicholes Rough, PA-C- [place] Cardiologist - denies  PPM/ICD - denies Chest x-ray - N/A EKG - N/A Stress Test - pt reports having stress test a few years ago d/t heart racing with taking Vyvanse- unsure where this was done, states this was normal and she has not had any reason to follow up with cardiologist since.  ECHO - N/A Cardiac Cath - N/A  Sleep Study - denies  Aspirin Instructions:N/A  ERAS Protcol -ERAS protocol  COVID TEST- N/A   Anesthesia review: no  Patient denies shortness of breath, fever, cough and chest pain over the phone call    Surgical Instructions    Your procedure is scheduled on Tuesday, July 25  Report to [facility] Main Entrance "A" at 5:30 A.M., then check in with the Admitting office.  Call this number if you have problems the morning of surgery:  [phone]    Remember:  Do not eat after midnight the night before your surgery  You may drink clear liquids until 4:30AM the morning of your surgery.   Clear liquids allowed are: Water, Non-Citrus Juices (without pulp), Carbonated Beverages, Clear Tea, Black Coffee ONLY (NO MILK, CREAM OR POWDERED CREAMER of any kind), and Gatorade   Take these medicines the morning of surgery with A SIP OF WATER: Lexapro, Robaxin PRN, benadryl PRN, Percocet PRN   As of today, STOP taking any Aspirin (unless otherwise instructed by your surgeon) Aleve, Naproxen, Ibuprofen, Motrin, Advil, Goody's, BC's, all herbal medications, fish oil, and all vitamins.  Stacy Deleon is not responsible for any belongings or valuables.    Contacts, glasses, hearing aids, dentures or partials may not be worn into surgery, please bring cases for these belongings   Patients discharged the day of  surgery will not be allowed to drive home, and someone needs to stay with them for 24 hours.   SURGICAL WAITING ROOM VISITATION 1 visitor is allowed in pre-op area with patient.  Patients having surgery or a procedure in a hospital may have two support people in the waiting room. Children under the age of 19 must have an adult with them who is not the patient. They may stay in the waiting area during the procedure and may switch out with other visitors. If the patient needs to stay at the hospital during part of their recovery, the visitor guidelines for inpatient rooms apply.  Please refer to the [facility] website for the visitor guidelines for Inpatients (after your surgery is over and you are in a regular room).     Special instructions:    Oral Hygiene is also important to reduce your risk of infection.  Remember - BRUSH YOUR TEETH THE MORNING OF SURGERY WITH YOUR REGULAR TOOTHPASTE   Day of Surgery:  Take a shower the day of or night before with antibacterial soap. Wear Clean/Comfortable clothing the morning of surgery Do not apply any deodorants/lotions.   Do not wear jewelry or makeup Do not wear lotions, powders, perfumes/colognes, or deodorant. Do not shave 48 hours prior to surgery.  Men may shave face and neck. Do not bring valuables to the hospital. Do not wear nail polish, gel polish, artificial nails, or any other type of covering on natural nails (fingers and toes) If you have artificial nails or gel  coating that need to be removed by a nail salon, please have this removed prior to surgery. Artificial nails or gel coating may interfere with anesthesia's ability to adequately monitor your vital signs. Remember to brush your teeth WITH YOUR REGULAR TOOTHPASTE.

## 2022-03-26 ENCOUNTER — Ambulatory Visit (HOSPITAL_BASED_OUTPATIENT_CLINIC_OR_DEPARTMENT_OTHER): Payer: Self-pay | Admitting: Certified Registered Nurse Anesthetist

## 2022-03-26 ENCOUNTER — Encounter (HOSPITAL_COMMUNITY)
Admission: RE | Disposition: A | Discharge status: Home / Self Care | Payer: Self-pay | Source: Home / Self Care | Attending: Orthopaedic Surgery

## 2022-03-26 ENCOUNTER — Other Ambulatory Visit: Payer: Self-pay

## 2022-03-26 ENCOUNTER — Ambulatory Visit (HOSPITAL_COMMUNITY): Payer: Self-pay

## 2022-03-26 ENCOUNTER — Encounter (HOSPITAL_COMMUNITY): Payer: Self-pay | Admitting: Orthopaedic Surgery

## 2022-03-26 ENCOUNTER — Ambulatory Visit (HOSPITAL_COMMUNITY): Payer: Self-pay | Admitting: Certified Registered Nurse Anesthetist

## 2022-03-26 ENCOUNTER — Ambulatory Visit (HOSPITAL_COMMUNITY)
Admission: RE | Admit: 2022-03-26 | Discharge: 2022-03-26 | Disposition: A | Discharge status: Home / Self Care | Payer: Self-pay | Attending: Orthopaedic Surgery | Admitting: Orthopaedic Surgery

## 2022-03-26 DIAGNOSIS — F419 Anxiety disorder, unspecified: Secondary | ICD-10-CM | POA: Insufficient documentation

## 2022-03-26 DIAGNOSIS — F32A Depression, unspecified: Secondary | ICD-10-CM | POA: Insufficient documentation

## 2022-03-26 DIAGNOSIS — Z87891 Personal history of nicotine dependence: Secondary | ICD-10-CM | POA: Insufficient documentation

## 2022-03-26 DIAGNOSIS — S93432A Sprain of tibiofibular ligament of left ankle, initial encounter: Secondary | ICD-10-CM | POA: Insufficient documentation

## 2022-03-26 DIAGNOSIS — S82892A Other fracture of left lower leg, initial encounter for closed fracture: Secondary | ICD-10-CM

## 2022-03-26 DIAGNOSIS — S8262XA Displaced fracture of lateral malleolus of left fibula, initial encounter for closed fracture: Secondary | ICD-10-CM | POA: Insufficient documentation

## 2022-03-26 DIAGNOSIS — W19XXXA Unspecified fall, initial encounter: Secondary | ICD-10-CM | POA: Insufficient documentation

## 2022-03-26 DIAGNOSIS — K219 Gastro-esophageal reflux disease without esophagitis: Secondary | ICD-10-CM | POA: Insufficient documentation

## 2022-03-26 HISTORY — PX: SYNDESMOSIS REPAIR: SHX5182

## 2022-03-26 HISTORY — DX: Unspecified optic neuritis: H46.9

## 2022-03-26 HISTORY — PX: ORIF ANKLE FRACTURE: SHX5408

## 2022-03-26 LAB — CBC
HCT: 39.9 % (ref 36.0–46.0)
Hemoglobin: 13 g/dL (ref 12.0–15.0)
MCH: 28.1 pg (ref 26.0–34.0)
MCHC: 32.6 g/dL (ref 30.0–36.0)
MCV: 86.2 fL (ref 80.0–100.0)
Platelets: 412 10*3/uL — ABNORMAL HIGH (ref 150–400)
RBC: 4.63 MIL/uL (ref 3.87–5.11)
RDW: 12.9 % (ref 11.5–15.5)
WBC: 8.7 10*3/uL (ref 4.0–10.5)
nRBC: 0 % (ref 0.0–0.2)

## 2022-03-26 LAB — SURGICAL PCR SCREEN
MRSA, PCR: NEGATIVE
Staphylococcus aureus: NEGATIVE

## 2022-03-26 SURGERY — OPEN REDUCTION INTERNAL FIXATION (ORIF) ANKLE FRACTURE
Anesthesia: Regional | Site: Ankle | Laterality: Left

## 2022-03-26 MED ORDER — MIDAZOLAM HCL 2 MG/2ML IJ SOLN
INTRAMUSCULAR | Status: AC
  Filled 2022-03-26: qty 2

## 2022-03-26 MED ORDER — MIDAZOLAM HCL 2 MG/2ML IJ SOLN
INTRAMUSCULAR | Status: DC | PRN
  Administered 2022-03-26: 2 mg via INTRAVENOUS

## 2022-03-26 MED ORDER — PROMETHAZINE HCL 25 MG/ML IJ SOLN
INTRAMUSCULAR | Status: AC
  Filled 2022-03-26: qty 1

## 2022-03-26 MED ORDER — FENTANYL CITRATE (PF) 100 MCG/2ML IJ SOLN: 25.0000 ug | INTRAMUSCULAR | Status: DC | PRN

## 2022-03-26 MED ORDER — 0.9 % SODIUM CHLORIDE (POUR BTL) OPTIME
TOPICAL | Status: DC | PRN
  Administered 2022-03-26: 1000 mL

## 2022-03-26 MED ORDER — PROPOFOL 10 MG/ML IV BOLUS
INTRAVENOUS | Status: AC
  Filled 2022-03-26: qty 20

## 2022-03-26 MED ORDER — LIDOCAINE-EPINEPHRINE (PF) 1.5 %-1:200000 IJ SOLN
INTRAMUSCULAR | Status: DC | PRN
  Administered 2022-03-26 (×2): 5 mL via PERINEURAL

## 2022-03-26 MED ORDER — PROPOFOL 500 MG/50ML IV EMUL
INTRAVENOUS | Status: DC | PRN
  Administered 2022-03-26: 75 ug/kg/min via INTRAVENOUS

## 2022-03-26 MED ORDER — ASPIRIN 325 MG PO TABS: 325.0000 mg | ORAL_TABLET | Freq: Every day | ORAL | 0 refills | Status: AC

## 2022-03-26 MED ORDER — ACETAMINOPHEN 10 MG/ML IV SOLN: 1000.0000 mg | Freq: Once | INTRAVENOUS | Status: DC | PRN

## 2022-03-26 MED ORDER — CEFAZOLIN SODIUM-DEXTROSE 2-4 GM/100ML-% IV SOLN
2.0000 g | INTRAVENOUS | Status: AC
  Administered 2022-03-26: 2 g via INTRAVENOUS
  Filled 2022-03-26: qty 100

## 2022-03-26 MED ORDER — PROPOFOL 10 MG/ML IV BOLUS
INTRAVENOUS | Status: DC | PRN
  Administered 2022-03-26: 200 mg via INTRAVENOUS

## 2022-03-26 MED ORDER — FENTANYL CITRATE (PF) 250 MCG/5ML IJ SOLN
INTRAMUSCULAR | Status: AC
  Filled 2022-03-26: qty 5

## 2022-03-26 MED ORDER — PROMETHAZINE HCL 25 MG/ML IJ SOLN
6.2500 mg | Freq: Once | INTRAMUSCULAR | Status: AC
  Administered 2022-03-26: 6.25 mg via INTRAVENOUS

## 2022-03-26 MED ORDER — CHLORHEXIDINE GLUCONATE 0.12 % MT SOLN
15.0000 mL | Freq: Once | OROMUCOSAL | Status: AC
  Administered 2022-03-26: 15 mL via OROMUCOSAL
  Filled 2022-03-26: qty 15

## 2022-03-26 MED ORDER — FENTANYL CITRATE (PF) 250 MCG/5ML IJ SOLN
INTRAMUSCULAR | Status: DC | PRN
  Administered 2022-03-26: 150 ug via INTRAVENOUS

## 2022-03-26 MED ORDER — LIDOCAINE 2% (20 MG/ML) 5 ML SYRINGE
INTRAMUSCULAR | Status: DC | PRN
  Administered 2022-03-26: 60 mg via INTRAVENOUS

## 2022-03-26 MED ORDER — PHENYLEPHRINE HCL-NACL 20-0.9 MG/250ML-% IV SOLN
INTRAVENOUS | Status: DC | PRN
  Administered 2022-03-26: 20 ug/min via INTRAVENOUS

## 2022-03-26 MED ORDER — KETOROLAC TROMETHAMINE 30 MG/ML IJ SOLN
30.0000 mg | Freq: Once | INTRAMUSCULAR | Status: DC | PRN
Start: 2022-03-26 — End: 2022-03-26

## 2022-03-26 MED ORDER — OXYCODONE-ACETAMINOPHEN 5-325 MG PO TABS: 1.0000 | ORAL_TABLET | ORAL | 0 refills | Status: DC | PRN

## 2022-03-26 MED ORDER — ACETAMINOPHEN 160 MG/5ML PO SOLN: 1000.0000 mg | Freq: Once | ORAL | Status: DC | PRN

## 2022-03-26 MED ORDER — DIPHENHYDRAMINE HCL 50 MG/ML IJ SOLN
INTRAMUSCULAR | Status: DC | PRN
  Administered 2022-03-26: 25 mg via INTRAVENOUS

## 2022-03-26 MED ORDER — DEXAMETHASONE SODIUM PHOSPHATE 10 MG/ML IJ SOLN
INTRAMUSCULAR | Status: DC | PRN
  Administered 2022-03-26: 10 mg via INTRAVENOUS

## 2022-03-26 MED ORDER — BUPIVACAINE-EPINEPHRINE (PF) 0.5% -1:200000 IJ SOLN
INTRAMUSCULAR | Status: DC | PRN
  Administered 2022-03-26: 25 mL via PERINEURAL
  Administered 2022-03-26: 10 mL via PERINEURAL

## 2022-03-26 MED ORDER — ACETAMINOPHEN 500 MG PO TABS: 1000.0000 mg | ORAL_TABLET | Freq: Once | ORAL | Status: DC | PRN

## 2022-03-26 MED ORDER — ONDANSETRON HCL 4 MG/2ML IJ SOLN
INTRAMUSCULAR | Status: DC | PRN
  Administered 2022-03-26: 8 mg via INTRAVENOUS

## 2022-03-26 MED ORDER — ORAL CARE MOUTH RINSE: 15.0000 mL | Freq: Once | OROMUCOSAL | Status: AC

## 2022-03-26 MED ORDER — LACTATED RINGERS IV SOLN: INTRAVENOUS | Status: DC

## 2022-03-26 SURGICAL SUPPLY — 65 items
ALCOHOL 70% 16 OZ (MISCELLANEOUS) ×2 IMPLANT
BAG COUNTER SPONGE SURGICOUNT (BAG) ×2 IMPLANT
BANDAGE ESMARK 6X9 LF (GAUZE/BANDAGES/DRESSINGS) IMPLANT
BIT DRILL 2 CANN GRADUATED (BIT) ×1 IMPLANT
BIT DRILL 2.5 CANN STRL (BIT) ×1 IMPLANT
BIT DRILL 3 CANN ENDOSCOPIC (BIT) ×1 IMPLANT
BLADE SURG 15 STRL LF DISP TIS (BLADE) ×2 IMPLANT
BLADE SURG 15 STRL SS (BLADE) ×3
BNDG COHESIVE 4X5 TAN STRL (GAUZE/BANDAGES/DRESSINGS) IMPLANT
BNDG COHESIVE 6X5 TAN STRL LF (GAUZE/BANDAGES/DRESSINGS) IMPLANT
BNDG ELASTIC 6X10 VLCR STRL LF (GAUZE/BANDAGES/DRESSINGS) ×3 IMPLANT
BNDG ESMARK 6X9 LF (GAUZE/BANDAGES/DRESSINGS)
CANISTER SUCT 3000ML PPV (MISCELLANEOUS) ×3 IMPLANT
CHLORAPREP W/TINT 26 (MISCELLANEOUS) ×6 IMPLANT
COVER SURGICAL LIGHT HANDLE (MISCELLANEOUS) ×3 IMPLANT
CUFF TOURN SGL QUICK 34 (TOURNIQUET CUFF) ×3
CUFF TOURN SGL QUICK 42 (TOURNIQUET CUFF) IMPLANT
CUFF TRNQT CYL 34X4.125X (TOURNIQUET CUFF) ×2 IMPLANT
DRAPE OEC MINIVIEW 54X84 (DRAPES) ×2 IMPLANT
DRAPE U-SHAPE 47X51 STRL (DRAPES) ×3 IMPLANT
DRSG MEPITEL 4X7.2 (GAUZE/BANDAGES/DRESSINGS) ×2 IMPLANT
DRSG PAD ABDOMINAL 8X10 ST (GAUZE/BANDAGES/DRESSINGS) ×6 IMPLANT
DRSG XEROFORM 1X8 (GAUZE/BANDAGES/DRESSINGS) ×3 IMPLANT
ELECT REM PT RETURN 9FT ADLT (ELECTROSURGICAL) ×3
ELECTRODE REM PT RTRN 9FT ADLT (ELECTROSURGICAL) ×2 IMPLANT
GAUZE SPONGE 4X4 12PLY STRL (GAUZE/BANDAGES/DRESSINGS) IMPLANT
GAUZE SPONGE 4X4 12PLY STRL LF (GAUZE/BANDAGES/DRESSINGS) ×3 IMPLANT
GLOVE BIOGEL M STRL SZ7.5 (GLOVE) ×3 IMPLANT
GLOVE BIOGEL PI IND STRL 8 (GLOVE) ×2 IMPLANT
GLOVE BIOGEL PI INDICATOR 8 (GLOVE) ×3
GLOVE SRG 8 PF TXTR STRL LF DI (GLOVE) ×2 IMPLANT
GLOVE SURG ENC TEXT LTX SZ7.5 (GLOVE) ×3 IMPLANT
GLOVE SURG UNDER POLY LF SZ8 (GLOVE) ×3
GOWN STRL REUS W/ TWL LRG LVL3 (GOWN DISPOSABLE) ×2 IMPLANT
GOWN STRL REUS W/ TWL XL LVL3 (GOWN DISPOSABLE) ×4 IMPLANT
GOWN STRL REUS W/TWL LRG LVL3 (GOWN DISPOSABLE)
GOWN STRL REUS W/TWL XL LVL3 (GOWN DISPOSABLE) ×9
KIT BASIN OR (CUSTOM PROCEDURE TRAY) ×3 IMPLANT
KIT TURNOVER KIT B (KITS) ×3 IMPLANT
NS IRRIG 1000ML POUR BTL (IV SOLUTION) ×3 IMPLANT
PACK ORTHO EXTREMITY (CUSTOM PROCEDURE TRAY) ×3 IMPLANT
PAD ABD 8X10 STRL (GAUZE/BANDAGES/DRESSINGS) ×2 IMPLANT
PAD ARMBOARD 7.5X6 YLW CONV (MISCELLANEOUS) ×6 IMPLANT
PAD CAST 4YDX4 CTTN HI CHSV (CAST SUPPLIES) ×2 IMPLANT
PADDING CAST COTTON 4X4 STRL (CAST SUPPLIES) ×3
PADDING CAST COTTON 6X4 STRL (CAST SUPPLIES) ×1 IMPLANT
PLATE LOCK DIST FIB 5H TTNIUM (Plate) ×1 IMPLANT
SCREW LOCK 18X3XVALOPRFL (Screw) IMPLANT
SCREW LOCK TI QF 3X14 (Screw) ×2 IMPLANT
SCREW LOCKING 3.0X18 (Screw) ×3 IMPLANT
SCREW LOCKING VARIABLE 3.0X16 (Screw) ×1 IMPLANT
SCREW LP 3.5X12MM (Screw) ×1 IMPLANT
SCREW LP CORT 3.0X20 (Screw) ×1 IMPLANT
SCREW LP TI 3.5X14MM (Screw) ×2 IMPLANT
SPONGE T-LAP 18X18 ~~LOC~~+RFID (SPONGE) ×3 IMPLANT
SUCTION FRAZIER HANDLE 10FR (MISCELLANEOUS) ×3
SUCTION TUBE FRAZIER 10FR DISP (MISCELLANEOUS) ×2 IMPLANT
SUT ETHILON 3 0 PS 1 (SUTURE) ×3 IMPLANT
SUT MNCRL AB 3-0 PS2 27 (SUTURE) ×3 IMPLANT
SUT VIC AB 2-0 CT1 27 (SUTURE) ×6
SUT VIC AB 2-0 CT1 TAPERPNT 27 (SUTURE) ×4 IMPLANT
SYNDESMOSIS TIGHTROPE XP (Orthopedic Implant) ×1 IMPLANT
TOWEL GREEN STERILE (TOWEL DISPOSABLE) ×3 IMPLANT
TOWEL GREEN STERILE FF (TOWEL DISPOSABLE) ×3 IMPLANT
TUBE CONNECTING 12X1/4 (SUCTIONS) ×3 IMPLANT

## 2022-03-26 NOTE — Transfer of Care (Addendum)
Immediate Anesthesia Transfer of Care Note  Patient: Stacy Deleon  Procedure(s) Performed: OPEN TREATMENT OF LEFT LATERAL MALLEOLUS (Left: Ankle) SYNDESMOSIS (Left)  Patient Location: PACU  Anesthesia Type:General  Level of Consciousness: awake, alert  and oriented  Airway & Oxygen Therapy: Patient Spontanous Breathing  Post-op Assessment: Report given to RN and Post -op Vital signs reviewed and stable  Post vital signs: Reviewed and stable  Last Vitals:  Vitals Value Taken Time  BP 100/63   Temp 36.2   Pulse 83   Resp 12   SpO2 96     Last Pain:  Vitals:   03/26/22 0639  TempSrc:   PainSc: 8       Patients Stated Pain Goal: 1 (85/02/77 4128)  Complications: No notable events documented.

## 2022-03-26 NOTE — Anesthesia Procedure Notes (Signed)
Procedure Name: LMA Insertion Date/Time: 03/26/2022 7:45 AM  Performed by: Minerva Ends, CRNAPre-anesthesia Checklist: Patient identified, Emergency Drugs available, Suction available and Patient being monitored Patient Re-evaluated:Patient Re-evaluated prior to induction Oxygen Delivery Method: Circle system utilized Preoxygenation: Pre-oxygenation with 100% oxygen Induction Type: IV induction Ventilation: Mask ventilation without difficulty LMA: LMA inserted LMA Size: 4.0 Tube type: Oral Number of attempts: 1 Placement Confirmation: positive ETCO2 and breath sounds checked- equal and bilateral Tube secured with: Tape Dental Injury: Teeth and Oropharynx as per pre-operative assessment

## 2022-03-26 NOTE — Anesthesia Preprocedure Evaluation (Signed)
Anesthesia Evaluation  Patient identified by MRN, date of birth, ID band Patient awake    Reviewed: Allergy & Precautions, NPO status , Patient's Chart, lab work & pertinent test results  History of Anesthesia Complications (+) PONV and history of anesthetic complications  Airway Mallampati: III  TM Distance: >3 FB Neck ROM: Full    Dental  (+) Teeth Intact, Dental Advisory Given   Pulmonary former smoker,    breath sounds clear to auscultation       Cardiovascular negative cardio ROS   Rhythm:Regular     Neuro/Psych  Headaches, PSYCHIATRIC DISORDERS Anxiety Depression  Neuromuscular disease    GI/Hepatic Neg liver ROS, GERD  ,  Endo/Other  negative endocrine ROS  Renal/GU negative Renal ROS     Musculoskeletal  (+) Arthritis , LEFT ANKLE FRACTURE   Abdominal   Peds  Hematology negative hematology ROS (+) Lab Results      Component                Value               Date                      WBC                      8.7                 03/26/2022                HGB                      13.0                03/26/2022                HCT                      39.9                03/26/2022                MCV                      86.2                03/26/2022                PLT                      412 (H)             03/26/2022              Anesthesia Other Findings PCOS  Reproductive/Obstetrics Lab Results      Component                Value               Date                      PREGTESTUR               NEGATIVE            04/20/2013                PREGSERUM  NEGATIVE            03/06/2016           S/p hysterectomy                             Anesthesia Physical Anesthesia Plan  ASA: 2  Anesthesia Plan: General and Regional   Post-op Pain Management: Regional block* and Ofirmev IV (intra-op)*   Induction: Intravenous  PONV Risk Score and Plan: 4 or greater and  Ondansetron, Dexamethasone, Propofol infusion and Midazolam  Airway Management Planned: LMA  Additional Equipment: None  Intra-op Plan:   Post-operative Plan: Extubation in OR  Informed Consent: I have reviewed the patients History and Physical, chart, labs and discussed the procedure including the risks, benefits and alternatives for the proposed anesthesia with the patient or authorized representative who has indicated his/her understanding and acceptance.     Dental advisory given  Plan Discussed with: CRNA  Anesthesia Plan Comments:         Anesthesia Quick Evaluation

## 2022-03-26 NOTE — Anesthesia Procedure Notes (Signed)
Anesthesia Regional Block: Adductor canal block   Pre-Anesthetic Checklist: , timeout performed,  Correct Patient, Correct Site, Correct Laterality,  Correct Procedure, Correct Position, site marked,  Risks and benefits discussed,  Surgical consent,  Pre-op evaluation,  At surgeon's request and post-op pain management  Laterality: Left and Lower  Prep: chloraprep       Needles:  Injection technique: Single-shot      Needle Length: 9cm  Needle Gauge: 22     Additional Needles: Arrow StimuQuik ECHO Echogenic Stimulating PNB Needle  Procedures:,,,, ultrasound used (permanent image in chart),,    Narrative:  Start time: 03/26/2022 7:12 AM End time: 03/26/2022 7:16 AM Injection made incrementally with aspirations every 5 mL.  Performed by: Personally  Anesthesiologist: Oleta Mouse, MD

## 2022-03-26 NOTE — Op Note (Signed)
ANIS CINELLI female 41 y.o. 03/26/2022  PreOperative Diagnosis: Left lateral malleolus fracture Left syndesmosis disruption  PostOperative Diagnosis: Same  PROCEDURE: Open reduction internal fixation of left lateral malleolus Open reduction internal fixation of syndesmosis Ankle stress view fluoroscopy  SURGEON: Melony Overly, MD  ASSISTANT: Jesse Martinique, PA-C: His assistance was necessary for prep and drape, exposure, holding retractors, placement of hardware, wound closure and splinting.   ANESTHESIA: General with peripheral nerve block  FINDINGS: Displaced lateral malleolus fracture with syndesmotic disruption  IMPLANTS: Arthrex distal fibular locking plate Tight rope  [id number]:41 y.o. female sustained the above injury after a fall.  She had subluxation of her ankle joint laterally with obvious medial clear space widening and syndesmotic disruption.  She was splinted in emergency department and sent to me for evaluation.  She was indicated for surgery due to the instability pattern and nature of her fracture.   Patient understood the risks, benefits and alternatives to surgery which include but are not limited to wound healing complications, infection, nonunion, malunion, need for further surgery as well as damage to surrounding structures. They also understood the potential for continued pain in that there were no guarantees of acceptable outcome After weighing these risks the patient opted to proceed with surgery.  PROCEDURE: Patient was identified the preoperative holding area.  The left leg was marked myself.  Consent was signed myself and the patient.  Peripheral nerve block was performed by anesthesia.  Patient was taken to the operative suite and placed supine on the operative table.  General anesthesia was induced out difficulty.  Preoperative antibiotics were given.  Thigh tourniquet was placed on the operative thigh and a bump was placed under the hip. Bone  foam was used.  All bony prominences well-padded.  Surgical timeout was performed.  The operative extremity was then elevated and the tourniquet inflated to 250 mmHg.    ORIF Lateral malleolus: We began by making a longitudinal incision overlying the distal fibula.  This was taken sharply down through skin and subcutaneous tissue.  Blunt dissection was used to identify any branch of the superficial peroneal nerve which was not identified.  The incision was then taken sharply down to bone and the fracture site was identified.  The fracture site was mobilized.   The fracture site were cleaned with a rondure and curette of any fracture hematoma and callus formation.  Then through the fracture site we were able to gain access to the lateral ankle joint.  There was significant amount of consolidated hematoma tissue within the lateral gutter and the anterior ankle.  The lateral talar dome was inspected and found to be without evidence of cartilage damage.  Loose floating consolidated hematoma was removed with a rondure from the ankle joint.  The ankle joint was then irrigated copiously with normal saline.  Was taken to range of motion found to be without obvious cartilage damage.   Then the fracture of the fibula was reduced under direct visualization and held provisionally with a lobster claw.  Then fluoroscopy confirmed adequate reduction of the fibula at that time.  Then a combination of locking and nonlocking screws were used after placement of a lag screw across the fracture by technique.  This provided good stability of the distal fibula fracture.     Ankle stress view flouroscopy: Then under fluoroscopy the ankle was stressed using an anterior drawer, dorsiflex and external rotation moment and found unstable to be stable with regard to medial clear space widening or syndesmotic  widening.   ORIF of syndesmosis: Then through separate deep incision were able to gain access to the anterior aspect of the  syndesmosis.  There is disruption of the syndesmotic ligaments.  The syndesmosis was mobilized and rondure was used to clear out any interposed tissue and hematoma within the area of the incisura.  Then the syndesmosis was reduced under direct visualization and held provisionally with a Weber clamp.  Then using fluoroscopy a tight rope was placed in a standard fashion.  It was placed through the plate.  And the ankle was stressed under direct visualization and the syndesmosis was stable and there is no significant widening of the medial clear space.  Tourniquet was released.  The wounds were irrigated and the deep tissue was closed with a 3-0 vicryl. The subcuticular tissue was closed with 3-0 Monocryl and the skin with 3-0 nylon.  Xeroform placed on the wounds as well as 4 x 4's and sterile sheet cotton.  She tolerated this well.  There were no complications.  She was awakened from anesthesia and taken recovery in stable condition.  POST OPERATIVE INSTRUCTIONS: Nonweightbearing on operative extremity Keep splint dry and limb elevated Call the office with concerns Follow-up in 2 weeks for splint removal, x-rays of the operative ankle, nonweightbearing and suture removal if appropriate.     TOURNIQUET TIME: Less than 2 hours  BLOOD LOSS:  Minimal         DRAINS: none         SPECIMEN: none       COMPLICATIONS:  * No complications entered in OR log *         Disposition: PACU - hemodynamically stable.         Condition: stable

## 2022-03-26 NOTE — Anesthesia Procedure Notes (Signed)
Anesthesia Regional Block: Popliteal block   Pre-Anesthetic Checklist: , timeout performed,  Correct Patient, Correct Site, Correct Laterality,  Correct Procedure, Correct Position, site marked,  Risks and benefits discussed,  Surgical consent,  Pre-op evaluation,  At surgeon's request and post-op pain management  Laterality: Left and Lower  Prep: chloraprep       Needles:  Injection technique: Single-shot      Needle Length: 9cm  Needle Gauge: 22     Additional Needles: Arrow StimuQuik ECHO Echogenic Stimulating PNB Needle  Procedures:,,,, ultrasound used (permanent image in chart),,    Narrative:  Start time: 03/26/2022 7:17 AM End time: 03/26/2022 7:25 AM Injection made incrementally with aspirations every 5 mL.  Performed by: Personally  Anesthesiologist: Oleta Mouse, MD

## 2022-03-26 NOTE — Discharge Instructions (Signed)

## 2022-03-26 NOTE — H&P (Signed)
PREOPERATIVE H&P  Chief Complaint: Left ankle pain  HPI: Stacy Deleon is a 41 y.o. female who presents for preoperative history and physical with a diagnosis of left bimalleolar ankle fracture with possible syndesmotic disruption.  She sustained this after a fall.  She is here today for surgical intervention.  Patient states she has had 3 falls since she was seen in the clinic but has not disrupted the splint. Symptoms are rated as moderate to severe, and have been worsening.  This is significantly impairing activities of daily living.  She has elected for surgical management.   Past Medical History:  Diagnosis Date   ADHD (attention deficit hyperactivity disorder)    Anxiety    Bronchitis    Depression    GERD (gastroesophageal reflux disease)    Headache    Incontinence of urine    Neuromuscular disorder (HCC)    MIGRAINES    Optic neuritis    left eye- trouble with vision and eye pain   Polycystic ovary syndrome    PONV (postoperative nausea and vomiting)    "I wake up really hot"   Past Surgical History:  Procedure Laterality Date   ABDOMINAL EXPOSURE N/A 03/13/2016   Procedure: ABDOMINAL EXPOSURE;  Surgeon: Rosetta Posner, MD;  Location[facility] OR;  Service: Vascular;  Laterality: N/A;   ABDOMINAL HYSTERECTOMY     ANTERIOR LUMBAR FUSION N/A 03/13/2016   Procedure: ANTERIOR LUMBAR FUSION L4-L5    (1 LEVEL)  ALIF;  Surgeon: Melina Schools, MD;  Location: [place];  Service: Orthopedics;  Laterality: N/A;   HERNIA REPAIR     LUMBAR LAMINECTOMY/DECOMPRESSION MICRODISCECTOMY Left 10/13/2014   Procedure: LEFT L4-L5 DISCECTOMY    (1 LEVEL);  Surgeon: Melina Schools, MD;  Location: [place];  Service: Orthopedics;  Laterality: Left;   PLANTAR FASCIA SURGERY     TUBAL LIGATION     Social History   Socioeconomic History   Marital status: Single    Spouse name: Not on file   Number of children: Not on file   Years of education: Not on file   Highest education level: Not on file   Occupational History   Not on file  Tobacco Use   Smoking status: Former    Packs/day: 2.00    Years: 3.00    Total pack years: 6.00    Types: Cigarettes    Quit date: 10/03/2013    Years since quitting: 8.4   Smokeless tobacco: Never  Vaping Use   Vaping Use: Never used  Substance and Sexual Activity   Alcohol use: No   Drug use: No   Sexual activity: Not on file    Comment: last intercourse Oct 30th  Other Topics Concern   Not on file  Social History Narrative   Not on file   Social Determinants of Health   Financial Resource Strain: Not on file  Food Insecurity: Not on file  Transportation Needs: Not on file  Physical Activity: Not on file  Stress: Not on file  Social Connections: Not on file   Family History  Problem Relation Age of Onset   Hypertension Father    Fibromyalgia Father    Depression Father    Cancer Father    Bipolar disorder Brother    Fibromyalgia Brother    Allergies  Allergen Reactions   Other Other (See Comments)    The patient ask if she is given anything iv push for it to be done slowly because if its too fast  she has terrible anxiety   Oxycodone Itching    Takes percocet with benadryl to help with itching   Prior to Admission medications   Medication Sig Start Date End Date Taking? Authorizing Provider  diphenhydrAMINE (BENADRYL) 25 MG tablet Take 50 mg by mouth every 6 (six) hours as needed for itching.   Yes [provider]  escitalopram (LEXAPRO) 20 MG tablet Take 20 mg by mouth every morning. 04/30/19  Yes [provider]  ibuprofen (ADVIL) 200 MG tablet Take 600 mg by mouth every 8 (eight) hours as needed for moderate pain.   Yes [provider]  metFORMIN (GLUCOPHAGE) 500 MG tablet Take 500 mg by mouth in the morning and at bedtime. 03/18/22  Yes [provider]  methocarbamol (ROBAXIN) 500 MG tablet Take 500 mg by mouth every 8 (eight) hours as needed for muscle spasms. 03/20/22  Yes [provider]  oxyCODONE-acetaminophen (PERCOCET/ROXICET) 5-325 MG tablet Take 1 tablet by mouth every 6 (six) hours as needed for severe pain. 03/17/22  Yes Hong, Greggory Brandy, MD  TOPAMAX 50 MG tablet Take 50 mg by mouth at bedtime. 03/18/22  Yes [provider]  sertraline (ZOLOFT) 100 MG tablet Take 100 mg by mouth daily.    11/11/11  [provider]     Positive ROS: All other systems have been reviewed and were otherwise negative with the exception of those mentioned in the HPI and as above.  Physical Exam:  Vitals:   03/26/22 0601  BP: 120/76  Pulse: 81  Resp: 18  Temp: 98.9 F (37.2 C)  SpO2: 95%   General: Alert, no acute distress Cardiovascular: No pedal edema Respiratory: No cyanosis, no use of accessory musculature GI: No organomegaly, abdomen is soft and non-tender Skin: No lesions in the area of chief complaint Neurologic: Sensation intact distally Psychiatric: Patient is competent for consent with normal mood and affect Lymphatic: No axillary or cervical lymphadenopathy  MUSCULOSKELETAL: Left ankle in a short leg splint.  Toes exposed are warm and well-perfused.  Clinically the splint looks well aligned and is not disrupted.  She has no tenderness proximal or distal to the splint.  Endorses sensation to light touch about the foot.  Foot is warm and well-perfused  Assessment: Left bimalleolar equivalent ankle fracture with possible syndesmotic disruption   Plan: Plan for open treatment of her lateral malleolus and then assess stability of the medial clear space and syndesmosis and fixate as needed.  We discussed the risks, benefits and alternatives of surgery which include but are not limited to wound healing complications, infection, nonunion, malunion, need for further surgery, damage to surrounding structures and continued pain.  They understand there is no guarantees to an acceptable outcome.  After weighing these risks they opted to proceed with  surgery.     Erle Crocker, MD    03/26/2022 7:11 AM

## 2022-03-27 ENCOUNTER — Encounter (HOSPITAL_COMMUNITY): Payer: Self-pay | Admitting: Orthopaedic Surgery

## 2022-03-27 NOTE — Anesthesia Postprocedure Evaluation (Signed)
Anesthesia Post Note  Patient: Stacy Deleon  Procedure(s) Performed: OPEN TREATMENT OF LEFT LATERAL MALLEOLUS (Left: Ankle) SYNDESMOSIS (Left)     Patient location during evaluation: PACU Anesthesia Type: Regional and General Level of consciousness: awake and alert Pain management: pain level controlled Vital Signs Assessment: post-procedure vital signs reviewed and stable Respiratory status: spontaneous breathing, nonlabored ventilation, respiratory function stable and patient connected to nasal cannula oxygen Cardiovascular status: blood pressure returned to baseline and stable Postop Assessment: no apparent nausea or vomiting Anesthetic complications: no   No notable events documented.  Last Vitals:  Vitals:   03/26/22 0927 03/26/22 0942  BP: (!) 106/54 103/66  Pulse: 83 85  Resp: 17 (!) 21  Temp:  36.7 C  SpO2: 98% 97%    Last Pain:  Vitals:   03/26/22 0942  TempSrc:   PainSc: 0-No pain                 Nazli Penn

## 2022-03-29 ENCOUNTER — Encounter (HOSPITAL_COMMUNITY): Payer: Self-pay | Admitting: Orthopaedic Surgery

## 2022-08-29 ENCOUNTER — Other Ambulatory Visit: Payer: Self-pay

## 2022-10-10 ENCOUNTER — Other Ambulatory Visit: Payer: Self-pay

## 2022-10-10 ENCOUNTER — Inpatient Hospital Stay (HOSPITAL_COMMUNITY)
Admission: EM | Admit: 2022-10-10 | Discharge: 2022-10-12 | DRG: 125 | Disposition: A | Discharge status: Home / Self Care | Payer: Medicaid Other | Attending: Internal Medicine | Admitting: Internal Medicine

## 2022-10-10 ENCOUNTER — Inpatient Hospital Stay (HOSPITAL_COMMUNITY): Payer: Medicaid Other

## 2022-10-10 ENCOUNTER — Encounter (HOSPITAL_COMMUNITY): Payer: Self-pay

## 2022-10-10 DIAGNOSIS — Z597 Insufficient social insurance and welfare support: Secondary | ICD-10-CM | POA: Diagnosis not present

## 2022-10-10 DIAGNOSIS — D75839 Thrombocytosis, unspecified: Secondary | ICD-10-CM | POA: Diagnosis present

## 2022-10-10 DIAGNOSIS — Z5329 Procedure and treatment not carried out because of patient's decision for other reasons: Secondary | ICD-10-CM | POA: Diagnosis not present

## 2022-10-10 DIAGNOSIS — E876 Hypokalemia: Secondary | ICD-10-CM | POA: Diagnosis present

## 2022-10-10 DIAGNOSIS — K219 Gastro-esophageal reflux disease without esophagitis: Secondary | ICD-10-CM | POA: Diagnosis present

## 2022-10-10 DIAGNOSIS — Z7984 Long term (current) use of oral hypoglycemic drugs: Secondary | ICD-10-CM

## 2022-10-10 DIAGNOSIS — F419 Anxiety disorder, unspecified: Secondary | ICD-10-CM | POA: Diagnosis present

## 2022-10-10 DIAGNOSIS — Z90710 Acquired absence of both cervix and uterus: Secondary | ICD-10-CM

## 2022-10-10 DIAGNOSIS — E282 Polycystic ovarian syndrome: Secondary | ICD-10-CM | POA: Diagnosis present

## 2022-10-10 DIAGNOSIS — Z79899 Other long term (current) drug therapy: Secondary | ICD-10-CM | POA: Diagnosis not present

## 2022-10-10 DIAGNOSIS — Z885 Allergy status to narcotic agent status: Secondary | ICD-10-CM | POA: Diagnosis not present

## 2022-10-10 DIAGNOSIS — H538 Other visual disturbances: Secondary | ICD-10-CM | POA: Diagnosis not present

## 2022-10-10 DIAGNOSIS — Z820 Family history of epilepsy and other diseases of the nervous system: Secondary | ICD-10-CM

## 2022-10-10 DIAGNOSIS — Z87891 Personal history of nicotine dependence: Secondary | ICD-10-CM | POA: Diagnosis not present

## 2022-10-10 DIAGNOSIS — E871 Hypo-osmolality and hyponatremia: Secondary | ICD-10-CM | POA: Diagnosis present

## 2022-10-10 DIAGNOSIS — Z9851 Tubal ligation status: Secondary | ICD-10-CM

## 2022-10-10 DIAGNOSIS — G35 Multiple sclerosis: Secondary | ICD-10-CM | POA: Diagnosis present

## 2022-10-10 DIAGNOSIS — Z6841 Body Mass Index (BMI) 40.0 and over, adult: Secondary | ICD-10-CM

## 2022-10-10 DIAGNOSIS — Z96662 Presence of left artificial ankle joint: Secondary | ICD-10-CM | POA: Diagnosis present

## 2022-10-10 DIAGNOSIS — H532 Diplopia: Secondary | ICD-10-CM | POA: Diagnosis present

## 2022-10-10 DIAGNOSIS — G43109 Migraine with aura, not intractable, without status migrainosus: Secondary | ICD-10-CM | POA: Diagnosis present

## 2022-10-10 DIAGNOSIS — F909 Attention-deficit hyperactivity disorder, unspecified type: Secondary | ICD-10-CM | POA: Diagnosis present

## 2022-10-10 DIAGNOSIS — Z981 Arthrodesis status: Secondary | ICD-10-CM | POA: Diagnosis not present

## 2022-10-10 DIAGNOSIS — Z23 Encounter for immunization: Secondary | ICD-10-CM

## 2022-10-10 DIAGNOSIS — H469 Unspecified optic neuritis: Secondary | ICD-10-CM | POA: Diagnosis not present

## 2022-10-10 DIAGNOSIS — Z818 Family history of other mental and behavioral disorders: Secondary | ICD-10-CM | POA: Diagnosis not present

## 2022-10-10 DIAGNOSIS — F32A Depression, unspecified: Secondary | ICD-10-CM | POA: Diagnosis present

## 2022-10-10 DIAGNOSIS — H5712 Ocular pain, left eye: Principal | ICD-10-CM | POA: Diagnosis present

## 2022-10-10 LAB — CBC WITH DIFFERENTIAL/PLATELET
Abs Immature Granulocytes: 0.05 10*3/uL (ref 0.00–0.07)
Basophils Absolute: 0.1 10*3/uL (ref 0.0–0.1)
Basophils Relative: 1 %
Eosinophils Absolute: 0.1 10*3/uL (ref 0.0–0.5)
Eosinophils Relative: 0 %
HCT: 40.2 % (ref 36.0–46.0)
Hemoglobin: 13.5 g/dL (ref 12.0–15.0)
Immature Granulocytes: 0 %
Lymphocytes Relative: 29 %
Lymphs Abs: 3.7 10*3/uL (ref 0.7–4.0)
MCH: 28.7 pg (ref 26.0–34.0)
MCHC: 33.6 g/dL (ref 30.0–36.0)
MCV: 85.4 fL (ref 80.0–100.0)
Monocytes Absolute: 1 10*3/uL (ref 0.1–1.0)
Monocytes Relative: 8 %
Neutro Abs: 7.9 10*3/uL — ABNORMAL HIGH (ref 1.7–7.7)
Neutrophils Relative %: 62 %
Platelets: 425 10*3/uL — ABNORMAL HIGH (ref 150–400)
RBC: 4.71 MIL/uL (ref 3.87–5.11)
RDW: 12.3 % (ref 11.5–15.5)
WBC: 12.8 10*3/uL — ABNORMAL HIGH (ref 4.0–10.5)
nRBC: 0 % (ref 0.0–0.2)

## 2022-10-10 LAB — BASIC METABOLIC PANEL
Anion gap: 10 (ref 5–15)
BUN: 12 mg/dL (ref 6–20)
CO2: 25 mmol/L (ref 22–32)
Calcium: 9.5 mg/dL (ref 8.9–10.3)
Chloride: 101 mmol/L (ref 98–111)
Creatinine, Ser: 0.94 mg/dL (ref 0.44–1.00)
GFR, Estimated: >60 mL/min (ref >60)
Glucose, Bld: 99 mg/dL (ref 70–99)
Potassium: 3.3 mmol/L — ABNORMAL LOW (ref 3.5–5.1)
Sodium: 136 mmol/L (ref 135–145)

## 2022-10-10 MED ORDER — ACETAMINOPHEN 325 MG PO TABS: 650.0000 mg | ORAL_TABLET | Freq: Four times a day (QID) | ORAL | Status: DC | PRN

## 2022-10-10 MED ORDER — PNEUMOCOCCAL 20-VAL CONJ VACC 0.5 ML IM SUSY
0.5000 mL | PREFILLED_SYRINGE | INTRAMUSCULAR | Status: AC
  Administered 2022-10-11: 0.5 mL via INTRAMUSCULAR
  Filled 2022-10-10: qty 0.5

## 2022-10-10 MED ORDER — POTASSIUM CHLORIDE CRYS ER 20 MEQ PO TBCR
40.0000 meq | EXTENDED_RELEASE_TABLET | Freq: Once | ORAL | Status: AC
  Administered 2022-10-10: 40 meq via ORAL
  Filled 2022-10-10: qty 2

## 2022-10-10 MED ORDER — HYDROMORPHONE HCL 1 MG/ML IJ SOLN
1.0000 mg | Freq: Once | INTRAMUSCULAR | Status: AC
  Administered 2022-10-10: 1 mg via INTRAVENOUS
  Filled 2022-10-10: qty 1

## 2022-10-10 MED ORDER — GADOBUTROL 1 MMOL/ML IV SOLN
10.0000 mL | Freq: Once | INTRAVENOUS | Status: AC | PRN
  Administered 2022-10-10: 10 mL via INTRAVENOUS

## 2022-10-10 MED ORDER — INFLUENZA VAC SPLIT QUAD 0.5 ML IM SUSY
0.5000 mL | PREFILLED_SYRINGE | INTRAMUSCULAR | Status: AC
  Administered 2022-10-11: 0.5 mL via INTRAMUSCULAR
  Filled 2022-10-10: qty 0.5

## 2022-10-10 MED ORDER — ESCITALOPRAM OXALATE 10 MG PO TABS
20.0000 mg | ORAL_TABLET | Freq: Every day | ORAL | Status: DC
  Administered 2022-10-11 – 2022-10-12 (×2): 20 mg via ORAL
  Filled 2022-10-10 (×2): qty 2

## 2022-10-10 MED ORDER — ACETAMINOPHEN 650 MG RE SUPP: 650.0000 mg | Freq: Four times a day (QID) | RECTAL | Status: DC | PRN

## 2022-10-10 MED ORDER — SODIUM CHLORIDE 0.9 % IV SOLN
500.0000 mg | Freq: Two times a day (BID) | INTRAVENOUS | Status: DC
  Administered 2022-10-11 – 2022-10-12 (×3): 500 mg via INTRAVENOUS
  Filled 2022-10-10: qty 4
  Filled 2022-10-10 (×3): qty 8
  Filled 2022-10-10 (×2): qty 4

## 2022-10-10 MED ORDER — METFORMIN HCL 500 MG PO TABS
1000.0000 mg | ORAL_TABLET | Freq: Every day | ORAL | Status: DC
  Administered 2022-10-11: 1000 mg via ORAL
  Filled 2022-10-10: qty 2

## 2022-10-10 MED ORDER — TOPIRAMATE 25 MG PO TABS
50.0000 mg | ORAL_TABLET | Freq: Every day | ORAL | Status: DC
Start: 2022-10-10 — End: 2022-10-10

## 2022-10-10 MED ORDER — DIPHENHYDRAMINE HCL 25 MG PO CAPS: 50.0000 mg | ORAL_CAPSULE | Freq: Four times a day (QID) | ORAL | Status: DC | PRN

## 2022-10-10 MED ORDER — DIPHENHYDRAMINE HCL 25 MG PO CAPS
50.0000 mg | ORAL_CAPSULE | Freq: Four times a day (QID) | ORAL | Status: DC | PRN
  Administered 2022-10-10 (×2): 50 mg via ORAL
  Filled 2022-10-10 (×2): qty 2

## 2022-10-10 MED ORDER — SODIUM CHLORIDE 0.9 % IV SOLN
500.0000 mg | Freq: Once | INTRAVENOUS | Status: AC
  Administered 2022-10-10: 500 mg via INTRAVENOUS
  Filled 2022-10-10: qty 8

## 2022-10-10 MED ORDER — IBUPROFEN 200 MG PO TABS: 600.0000 mg | ORAL_TABLET | Freq: Three times a day (TID) | ORAL | Status: DC | PRN

## 2022-10-10 MED ORDER — ONDANSETRON HCL 4 MG/2ML IJ SOLN
4.0000 mg | Freq: Once | INTRAMUSCULAR | Status: AC
  Administered 2022-10-10: 4 mg via INTRAVENOUS
  Filled 2022-10-10: qty 2

## 2022-10-10 MED ORDER — ONDANSETRON HCL 4 MG/2ML IJ SOLN: 4.0000 mg | Freq: Four times a day (QID) | INTRAMUSCULAR | Status: DC | PRN

## 2022-10-10 MED ORDER — OXYCODONE-ACETAMINOPHEN 5-325 MG PO TABS
1.0000 | ORAL_TABLET | ORAL | Status: DC | PRN
  Administered 2022-10-10 – 2022-10-11 (×5): 1 via ORAL
  Filled 2022-10-10 (×7): qty 1

## 2022-10-10 MED ORDER — LORAZEPAM 2 MG/ML PO CONC
1.0000 mg | Freq: Once | ORAL | Status: AC | PRN
  Administered 2022-10-10: 1 mg via ORAL
  Filled 2022-10-10 (×2): qty 0.5

## 2022-10-10 MED ORDER — ONDANSETRON HCL 4 MG PO TABS: 4.0000 mg | ORAL_TABLET | Freq: Four times a day (QID) | ORAL | Status: DC | PRN

## 2022-10-10 MED ORDER — ENOXAPARIN SODIUM 60 MG/0.6ML IJ SOSY
60.0000 mg | PREFILLED_SYRINGE | INTRAMUSCULAR | Status: DC
  Administered 2022-10-10: 60 mg via SUBCUTANEOUS
  Filled 2022-10-10: qty 0.6

## 2022-10-10 NOTE — ED Provider Notes (Signed)
[facility] Provider Note   CSN: [id number] Arrival date & time: 10/10/22  1532     History  Chief Complaint  Patient presents with   Lt Eye Pain    Stacy Deleon is a 42 y.o. female.  HPI   This patient is a 42 year old female, history of optic neuritis of unknown etiology.  Multiple family members with multiple sclerosis, she is followed closely by ophthalmology and was sent over here today by her local ophthalmology office and retina specialist, Dr. Zenia Resides office -she states that she has had several days of increasing pain on the left side, decreased vision, she is now starting to see different shades of gray-white and black with some blurred and occasionally double vision.  This is similar to what happened in the past when she was admitted to the hospital about a year and a half ago.  She has never had a formal diagnosis of multiple sclerosis or other demyelinating disease.  She does have some intermittent tingling and discomfort to her left arm and leg as well.  This does not necessarily correlate with her visual changes.  She denies being a diabetic, no other symptoms.  She was sent from the ophthalmology office for admission and high-dose steroids  Home Medications Prior to Admission medications   Medication Sig Start Date End Date Taking? Authorizing Provider  diphenhydrAMINE (BENADRYL) 25 MG tablet Take 50 mg by mouth every 6 (six) hours as needed for itching.    [provider]  escitalopram (LEXAPRO) 20 MG tablet Take 20 mg by mouth every morning. 04/30/19   [provider]  ibuprofen (ADVIL) 200 MG tablet Take 600 mg by mouth every 8 (eight) hours as needed for moderate pain.    [provider]  metFORMIN (GLUCOPHAGE) 500 MG tablet Take 500 mg by mouth in the morning and at bedtime. 03/18/22   [provider]  methocarbamol (ROBAXIN) 500 MG tablet Take 500 mg by mouth every 8 (eight) hours as  needed for muscle spasms. 03/20/22   [provider]  oxyCODONE-acetaminophen (PERCOCET) 5-325 MG tablet Take 1 tablet by mouth every 4 (four) hours as needed for severe pain. 03/26/22 03/26/23  Martinique, Jesse J, PA-C  oxyCODONE-acetaminophen (PERCOCET/ROXICET) 5-325 MG tablet Take 1 tablet by mouth every 6 (six) hours as needed for severe pain. 03/17/22   Luna Fuse, MD  TOPAMAX 50 MG tablet Take 50 mg by mouth at bedtime. 03/18/22   [provider]  sertraline (ZOLOFT) 100 MG tablet Take 100 mg by mouth daily.    11/11/11  [provider]      Allergies    Other and Oxycodone    Review of Systems   Review of Systems  All other systems reviewed and are negative.   Physical Exam Updated Vital Signs BP 104/67   Pulse 87   Temp 99.2 F (37.3 C) (Oral)   Resp 20   Ht 1.651 m ('5\' 5"'$ )   Wt 114 kg   LMP 04/18/2017   SpO2 98%   BMI 41.82 kg/m  Physical Exam Vitals and nursing note reviewed.  Constitutional:      General: She is not in acute distress.    Appearance: She is well-developed.  HENT:     Head: Normocephalic and atraumatic.     Nose: No congestion or rhinorrhea.     Mouth/Throat:     Mouth: Mucous membranes are moist.     Pharynx: No oropharyngeal  exudate.  Eyes:     General: No scleral icterus.       Right eye: No discharge.        Left eye: No discharge.     Conjunctiva/sclera: Conjunctivae normal.     Pupils: Pupils are equal, round, and reactive to light.  Neck:     Thyroid: No thyromegaly.     Vascular: No JVD.  Cardiovascular:     Rate and Rhythm: Normal rate and regular rhythm.     Heart sounds: Normal heart sounds. No murmur heard.    No friction rub. No gallop.  Pulmonary:     Effort: Pulmonary effort is normal. No respiratory distress.     Breath sounds: Normal breath sounds. No wheezing or rales.  Abdominal:     General: Bowel sounds are normal. There is no distension.     Palpations: Abdomen is soft. There is no mass.      Tenderness: There is no abdominal tenderness.  Musculoskeletal:        General: No tenderness. Normal range of motion.     Cervical back: Normal range of motion and neck supple.     Right lower leg: No edema.     Left lower leg: No edema.  Lymphadenopathy:     Cervical: No cervical adenopathy.  Skin:    General: Skin is warm and dry.     Findings: No erythema or rash.  Neurological:     Mental Status: She is alert.     Coordination: Coordination normal.     Comments: Bilateral pupils are dilated, appears to have normal strength coordination and sensation bilateral left and right upper and lower extremities, speech is clear, no facial droop  Psychiatric:        Behavior: Behavior normal.     ED Results / Procedures / Treatments   Labs (all labs ordered are listed, but only abnormal results are displayed) Labs Reviewed  CBC WITH DIFFERENTIAL/PLATELET - Abnormal; Notable for the following components:      Result Value   WBC 12.8 (*)    Platelets 425 (*)    Neutro Abs 7.9 (*)    All other components within normal limits  BASIC METABOLIC PANEL - Abnormal; Notable for the following components:   Potassium 3.3 (*)    All other components within normal limits    EKG None  Radiology No results found.  Procedures Procedures    Medications Ordered in ED Medications  methylPREDNISolone sodium succinate (SOLU-MEDROL) 500 mg in sodium chloride 0.9 % 50 mL IVPB (has no administration in time range)  HYDROmorphone (DILAUDID) injection 1 mg (1 mg Intravenous Given 10/10/22 1646)  ondansetron (ZOFRAN) injection 4 mg (4 mg Intravenous Given 10/10/22 1646)    ED Course/ Medical Decision Making/ A&P                             Medical Decision Making Amount and/or Complexity of Data Reviewed Labs: ordered. Radiology: ordered.  Risk Prescription drug management. Decision regarding hospitalization.   The patient has acute recurrent optic neuritis  This patient presents to  the ED for concern of visual changes, this involves an extensive number of treatment options, and is a complaint that carries with it a high risk of complications and morbidity.  The differential diagnosis includes optic neuritis, retinal hemorrhage, vitreous hemorrhage, retinal detachment, corneal abrasion although these seem less likely given that she has already been dilated and diagnosed by  retinal specialist today   Co morbidities that complicate the patient evaluation  Recurrent optic neuritis   Additional history obtained:  Additional history obtained from electronic medical record External records from outside source obtained and reviewed including prior admissions to the hospital dating back to 2022   Lab Tests:  I Ordered, and personally interpreted labs.  The pertinent results include: Unremarkable CBC and metabolic panel   Cardiac Monitoring: / EKG:  The patient was maintained on a cardiac monitor.  I personally viewed and interpreted the cardiac monitored which showed an underlying rhythm of: Normal sinus rhythm   Consultations Obtained:  I requested consultation with the ophthalmology and unassigned admission,  and discussed lab and imaging findings as well as pertinent plan - they recommend: High-dose steroids and admit to the hospital Dr. Rory Percy with neurology recommends 500 mg of Solu-Medrol twice daily, they will see the patient in person Discussed with hospitalist at 5:30 PM, they will admit   Problem List / ED Course / Critical interventions / Medication management  Optic neuritis I ordered medication including hydromorphone, Solu-Medrol for optic neuritis Reevaluation of the patient after these medicines showed that the patient improved I have reviewed the patients home medicines and have made adjustments as needed   Social Determinants of Health:  Recurrent neurologic disorder   Test / Admission - Considered:  Will admit at ophthalmology  recommendations         Final Clinical Impression(s) / ED Diagnoses Final diagnoses:  Optic neuritis    Rx / DC Orders ED Discharge Orders     None         Noemi Chapel, MD 10/10/22 1723

## 2022-10-10 NOTE — H&P (Addendum)
History and Physical    Stacy Deleon:[id number] DOB: 08/31/1981 DOA: 10/10/2022  PCP: Ladora Daniel, PA-C   Patient coming from: Home  I have personally briefly reviewed patient's old medical records in [facility]  Chief Complaint: Left eye pain  HPI: Stacy Deleon is a 42 y.o. female with medical history significant of PCOS, optic neuritis of unknown etiology followed closely by ophthalmology, multiple family members with multiple sclerosis was seen at her local ophthalmology office by retina specialist/Dr. Groat's office for increasing left eye pain with decreased vision along with some blurring of vision and occasional double vision for the last several days.  This apparently happened around a year and a half ago for which she was admitted to the hospital.  She also complains of intermittent tingling and discomfort in her left arm and leg as well.  She was sent to the hospital for admission, neurology evaluation and initiation of high-dose steroids.  Patient denies any fever, loss of consciousness, seizures, abdominal pain, diarrhea, dysuria, vomiting, cough or shortness of breath.  ED Course: ED provider spoke to on-call ophthalmologist who recommended high-dose steroids, neurology evaluation and admission.  ED provider spoke to neurology/Dr. Wilford Corner who recommended Solu-Medrol 500 mg IV twice a day along with MRI of brain and cervical spine. Hospitalist service was called to evaluate the patient.  Review of Systems: As per HPI otherwise all other systems were reviewed and are negative.   Past Medical History:  Diagnosis Date   ADHD (attention deficit hyperactivity disorder)    Anxiety    Bronchitis    Depression    GERD (gastroesophageal reflux disease)    Headache    Incontinence of urine    Neuromuscular disorder (HCC)    MIGRAINES    Optic neuritis    left eye- trouble with vision and eye pain   Polycystic ovary syndrome    PONV (postoperative nausea and  vomiting)    "I wake up really hot"    Past Surgical History:  Procedure Laterality Date   ABDOMINAL EXPOSURE N/A 03/13/2016   Procedure: ABDOMINAL EXPOSURE;  Surgeon: Larina Earthly, MD;  [facility] OR;  Service: Vascular;  Laterality: N/A;   ABDOMINAL HYSTERECTOMY     ANTERIOR LUMBAR FUSION N/A 03/13/2016   Procedure: ANTERIOR LUMBAR FUSION L4-L5    (1 LEVEL)  ALIF;  Surgeon: Venita Lick, MD;  Location: [facility] OR;  Service: Orthopedics;  Laterality: N/A;   HERNIA REPAIR     LUMBAR LAMINECTOMY/DECOMPRESSION MICRODISCECTOMY Left 10/13/2014   Procedure: LEFT L4-L5 DISCECTOMY    (1 LEVEL);  Surgeon: Venita Lick, MD;  Location: [facility] OR;  Service: Orthopedics;  Laterality: Left;   ORIF ANKLE FRACTURE Left 03/26/2022   Procedure: OPEN TREATMENT OF LEFT LATERAL MALLEOLUS;  Surgeon: Terance Hart, MD;  Location: [facility] OR;  Service: Orthopedics;  Laterality: Left;  LENGHT OF SURGERY: 90 MINUTES   PLANTAR FASCIA SURGERY     SYNDESMOSIS REPAIR Left 03/26/2022   Procedure: SYNDESMOSIS;  Surgeon: Terance Hart, MD;  Location: [facility] OR;  Service: Orthopedics;  Laterality: Left;   TUBAL LIGATION       reports that she quit smoking about 9 years ago. Her smoking use included cigarettes. She has a 6.00 pack-year smoking history. She has never used smokeless tobacco. She reports that she does not drink alcohol and does not use drugs.  Allergies  Allergen Reactions   Other Other (See Comments)    The patient ask if she is given anything  iv push for it to be done slowly because if its too fast she has terrible anxiety   Oxycodone Itching    Takes percocet with benadryl to help with itching    Family History  Problem Relation Age of Onset   Hypertension Father    Fibromyalgia Father    Depression Father    Cancer Father    Bipolar disorder Brother    Fibromyalgia Brother     Prior to Admission medications   Medication Sig Start Date End Date Taking? Authorizing Provider  diphenhydrAMINE  (BENADRYL) 25 MG tablet Take 50 mg by mouth every 6 (six) hours as needed for itching.    [provider]  escitalopram (LEXAPRO) 20 MG tablet Take 20 mg by mouth every morning. 04/30/19   [provider]  ibuprofen (ADVIL) 200 MG tablet Take 600 mg by mouth every 8 (eight) hours as needed for moderate pain.    [provider]  metFORMIN (GLUCOPHAGE) 500 MG tablet Take 500 mg by mouth in the morning and at bedtime. 03/18/22   [provider]  methocarbamol (ROBAXIN) 500 MG tablet Take 500 mg by mouth every 8 (eight) hours as needed for muscle spasms. 03/20/22   [provider]  oxyCODONE-acetaminophen (PERCOCET) 5-325 MG tablet Take 1 tablet by mouth every 4 (four) hours as needed for severe pain. 03/26/22 03/26/23  [place], Jesse J, PA-C  oxyCODONE-acetaminophen (PERCOCET/ROXICET) 5-325 MG tablet Take 1 tablet by mouth every 6 (six) hours as needed for severe pain. 03/17/22   Cheryll Cockayne, MD  TOPAMAX 50 MG tablet Take 50 mg by mouth at bedtime. 03/18/22   [provider]  sertraline (ZOLOFT) 100 MG tablet Take 100 mg by mouth daily.    11/11/11  [provider]    Physical Exam: Vitals:   10/10/22 1551 10/10/22 1605 10/10/22 1630  BP: 121/84  104/67  Pulse: (!) 110  87  Resp: 16  20  Temp: 99.2 F (37.3 C)    TempSrc: Oral    SpO2: 98%  98%  Weight:  114 kg   Height:  5\' 5"  (1.651 m)     Constitutional: NAD, calm, comfortable Vitals:   10/10/22 1551 10/10/22 1605 10/10/22 1630  BP: 121/84  104/67  Pulse: (!) 110  87  Resp: 16  20  Temp: 99.2 F (37.3 C)    TempSrc: Oral    SpO2: 98%  98%  Weight:  114 kg   Height:  5\' 5"  (1.651 m)    Eyes: PERRL, lids and conjunctivae normal ENMT: Mucous membranes are moist. Posterior pharynx clear of any exudate or lesions. Neck: normal, supple, no masses, no thyromegaly Respiratory: bilateral decreased breath sounds at bases, no wheezing, no crackles. Normal respiratory effort. No  accessory muscle use.  Cardiovascular: S1 S2 positive, rate controlled. No extremity edema. 2+ pedal pulses.  Abdomen: no tenderness, no masses palpated. No hepatosplenomegaly. Bowel sounds positive.  Musculoskeletal: no clubbing / cyanosis. No joint deformity upper and lower extremities.  Skin: no rashes, lesions, ulcers. No induration Neurologic: CN 2-12 grossly intact. Moving extremities. No focal neurologic deficits.  Psychiatric: Normal judgment and insight. Alert and oriented x 3. Normal mood.    Labs on Admission: I have personally reviewed following labs and imaging studies  CBC: Recent Labs  Lab 10/10/22 1640  WBC 12.8*  NEUTROABS 7.9*  HGB 13.5  HCT 40.2  MCV 85.4  PLT 425*   Basic Metabolic Panel: Recent Labs  Lab 10/10/22 1640  NA 136  K 3.3*  CL 101  CO2 25  GLUCOSE 99  BUN 12  CREATININE 0.94  CALCIUM 9.5   GFR: Estimated Creatinine Clearance: 100.2 mL/min (by C-G formula based on SCr of 0.94 mg/dL). Liver Function Tests: No results for input(s): "AST", "ALT", "ALKPHOS", "BILITOT", "PROT", "ALBUMIN" in the last 168 hours. No results for input(s): "LIPASE", "AMYLASE" in the last 168 hours. No results for input(s): "AMMONIA" in the last 168 hours. Coagulation Profile: No results for input(s): "INR", "PROTIME" in the last 168 hours. Cardiac Enzymes: No results for input(s): "CKTOTAL", "CKMB", "CKMBINDEX", "TROPONINI" in the last 168 hours. BNP (last 3 results) No results for input(s): "PROBNP" in the last 8760 hours. HbA1C: No results for input(s): "HGBA1C" in the last 72 hours. CBG: No results for input(s): "GLUCAP" in the last 168 hours. Lipid Profile: No results for input(s): "CHOL", "HDL", "LDLCALC", "TRIG", "CHOLHDL", "LDLDIRECT" in the last 72 hours. Thyroid Function Tests: No results for input(s): "TSH", "T4TOTAL", "FREET4", "T3FREE", "THYROIDAB" in the last 72 hours. Anemia Panel: No results for input(s): "VITAMINB12", "FOLATE", "FERRITIN",  "TIBC", "IRON", "RETICCTPCT" in the last 72 hours. Urine analysis:    Component Value Date/Time   COLORURINE YELLOW 04/20/2013 0654   APPEARANCEUR CLOUDY (A) 04/20/2013 0654   LABSPEC >1.030 (H) 04/20/2013 0654   PHURINE 5.5 04/20/2013 0654   GLUCOSEU NEGATIVE 04/20/2013 0654   HGBUR NEGATIVE 04/20/2013 0654   BILIRUBINUR SMALL (A) 04/20/2013 0654   KETONESUR NEGATIVE 04/20/2013 0654   PROTEINUR NEGATIVE 04/20/2013 0654   UROBILINOGEN 1.0 04/20/2013 0654   NITRITE NEGATIVE 04/20/2013 0654   LEUKOCYTESUR SMALL (A) 04/20/2013 0654    Radiological Exams on Admission: No results found.    Assessment/Plan  Left eye pain with concerns for optic neuritis/multiple sclerosis flare -Presented with few days of worsening left eye pain along with intermittent tingling and discomfort in her left arm and leg. -ED provider spoke to on-call ophthalmologist who recommended high-dose steroids, neurology evaluation and admission along with outpatient follow-up with ophthalmology in 6 weeks.  ED provider spoke to neurology/Dr. Wilford Corner who recommended Solu-Medrol 500 mg IV twice a day along with MRI of brain and cervical spine. -Follow official neurology recommendations. -Fall precautions.  PT eval.  Leukocytosis -Mild.  Reactive.  Repeat a.m. labs  Hypokalemia -Replace.  Repeat a.m. labs  Thrombocytosis -Possibly reactive.  Monitor.  PCOS -continue metformin  Depression -continue lexapro  Morbid obesity -outpatient follow-up   DVT prophylaxis: Lovenox Code Status: Full Family Communication: None at bedside Disposition Plan: Home in 3 to 4 days pending clinical improvement  consults called: Neurology by ED provider Admission status: Inpatient/MedSurg  Severity of Illness: The appropriate patient status for this patient is INPATIENT. Inpatient status is judged to be reasonable and necessary in order to provide the required intensity of service to ensure the patient's safety. The  patient's presenting symptoms, physical exam findings, and initial radiographic and laboratory data in the context of their chronic comorbidities is felt to place them at high risk for further clinical deterioration. Furthermore, it is not anticipated that the patient will be medically stable for discharge from the hospital within 2 midnights of admission.   * I certify that at the point of admission it is my clinical judgment that the patient will require inpatient hospital care spanning beyond 2 midnights from the point of admission due to high intensity of service, high risk for further deterioration and high frequency of surveillance required.*    Glade Lloyd MD [facility]  10/10/2022, 5:28 PM

## 2022-10-10 NOTE — ED Triage Notes (Signed)
Pt came in from eye doctors office d/t her having a flare-up of her Lt eyes Optic Neuritis. Pt states that this last occurred in Nov, 2022. That dr sent paperwork with pt for needed/requested labs/scans. A/Ox4, rates Lt eye pain 7/10 while in triage.

## 2022-10-10 NOTE — ED Notes (Signed)
ED TO INPATIENT HANDOFF REPORT  ED Nurse Name and Phone #: [phone] Cathlean Sauer Name/Age/Gender Stacy Deleon 42 y.o. female Room/Bed: 016C/016C  Code Status   Code Status: Full Code  Home/SNF/Other Home Patient oriented to: self, place, time, and situation Is this baseline? 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 Pt states that this last occurred in Nov, 2022. That dr sent paperwork with pt for needed/requested labs/scans. A/Ox4, rates Lt eye pain 7/10 while in triage.    Allergies Allergies  Allergen Reactions   Other Other (See Comments)    The patient ask if she is given anything iv push for it to be done slowly because if its too fast she has terrible anxiety   Oxycodone Itching    Takes percocet with benadryl to help with itching    Level of Care/Admitting Diagnosis ED Disposition     ED Disposition  Admit   Condition  --   [place]: [place] [[id number]]  Level of Care: Med-Surg [16]  May admit patient to [facility] or [facility] if equivalent level of care is available:: No  Covid Evaluation: Asymptomatic - no recent exposure (last 10 days) testing not required  Diagnosis: Multiple sclerosis ([place]) [340.ICD-9-CM]  Admitting Physician: Aline August [[id number]]  Attending Physician: Aline August [[id number]]  Certification:: I certify this patient will need inpatient services for at least 2 midnights  Estimated Length of Stay: 3          B Medical/Surgery History Past Medical History:  Diagnosis Date   ADHD (attention deficit hyperactivity disorder)    Anxiety    Bronchitis    Depression    GERD (gastroesophageal reflux disease)    Headache    Incontinence of urine    Neuromuscular disorder ([place])    MIGRAINES    Optic neuritis    left eye- trouble with vision  and eye pain   Polycystic ovary syndrome    PONV (postoperative nausea and vomiting)    "I wake up really hot"   Past Surgical History:  Procedure Laterality Date   ABDOMINAL EXPOSURE N/A 03/13/2016   Procedure: ABDOMINAL EXPOSURE;  Surgeon: Rosetta Posner, MD;  Location: [facility] OR;  Service: Vascular;  Laterality: N/A;   ABDOMINAL HYSTERECTOMY     ANTERIOR LUMBAR FUSION N/A 03/13/2016   Procedure: ANTERIOR LUMBAR FUSION L4-L5    (1 LEVEL)  ALIF;  Surgeon: Melina Schools, MD;  Location: [place];  Service: Orthopedics;  Laterality: N/A;   HERNIA REPAIR     LUMBAR LAMINECTOMY/DECOMPRESSION MICRODISCECTOMY Left 10/13/2014   Procedure: LEFT L4-L5 DISCECTOMY    (1 LEVEL);  Surgeon: Melina Schools, MD;  Location: [place];  Service: Orthopedics;  Laterality: Left;   ORIF ANKLE FRACTURE Left 03/26/2022   Procedure: OPEN TREATMENT OF LEFT LATERAL MALLEOLUS;  Surgeon: Erle Crocker, MD;  Location: [place];  Service: Orthopedics;  Laterality: Left;  LENGHT OF SURGERY: 90 MINUTES   PLANTAR FASCIA SURGERY     SYNDESMOSIS REPAIR Left 03/26/2022   Procedure: SYNDESMOSIS;  Surgeon: Erle Crocker, MD;  Location: [place];  Service: Orthopedics;  Laterality: Left;   TUBAL LIGATION       A IV Location/Drains/Wounds Patient Lines/Drains/Airways Status     Active Line/Drains/Airways     Name Placement date Placement time  Site Days   Peripheral IV 10/10/22 20 G Anterior;Proximal;Right Forearm 10/10/22  --  Forearm  less than 1            Intake/Output Last 24 hours No intake or output data in the 24 hours ending 10/10/22 1728  Labs/Imaging Results for orders placed or performed during the hospital encounter of 10/10/22 (from the past 48 hour(s))  CBC with Differential     Status: Abnormal   Collection Time: 10/10/22  4:40 PM  Result Value Ref Range   WBC 12.8 (H) 4.0 - 10.5 K/uL   RBC 4.71 3.87 - 5.11 MIL/uL   Hemoglobin 13.5 12.0 - 15.0 g/dL   HCT 40.2 36.0 - 46.0 %   MCV 85.4 80.0 - 100.0 fL    MCH 28.7 26.0 - 34.0 pg   MCHC 33.6 30.0 - 36.0 g/dL   RDW 12.3 11.5 - 15.5 %   Platelets 425 (H) 150 - 400 K/uL   nRBC 0.0 0.0 - 0.2 %   Neutrophils Relative % 62 %   Neutro Abs 7.9 (H) 1.7 - 7.7 K/uL   Lymphocytes Relative 29 %   Lymphs Abs 3.7 0.7 - 4.0 K/uL   Monocytes Relative 8 %   Monocytes Absolute 1.0 0.1 - 1.0 K/uL   Eosinophils Relative 0 %   Eosinophils Absolute 0.1 0.0 - 0.5 K/uL   Basophils Relative 1 %   Basophils Absolute 0.1 0.0 - 0.1 K/uL   Immature Granulocytes 0 %   Abs Immature Granulocytes 0.05 0.00 - 0.07 K/uL    Comment: Performed at [facility], [address] [place], [place] [address]  Basic metabolic panel     Status: Abnormal   Collection Time: 10/10/22  4:40 PM  Result Value Ref Range   Sodium 136 135 - 145 mmol/L   Potassium 3.3 (L) 3.5 - 5.1 mmol/L   Chloride 101 98 - 111 mmol/L   CO2 25 22 - 32 mmol/L   Glucose, Bld 99 70 - 99 mg/dL    Comment: Glucose reference range applies only to samples taken after fasting for at least 8 hours.   BUN 12 6 - 20 mg/dL   Creatinine, Ser 0.94 0.44 - 1.00 mg/dL   Calcium 9.5 8.9 - 10.3 mg/dL   GFR, Estimated >60 >60 mL/min    Comment: (NOTE) Calculated using the CKD-EPI Creatinine Equation (2021)    Anion gap 10 5 - 15    Comment: Performed at [facility] [address], [place], [place] [address]   No results found.  Pending Labs Unresulted Labs (From admission, onward)     Start     Ordered   10/17/22 0500  Creatinine, serum  (enoxaparin (LOVENOX)    CrCl >/= 30 ml/min)  Weekly,   R     Comments: while on enoxaparin therapy    10/10/22 1727   10/11/22 0500  HIV Antibody (routine testing w rflx)  (HIV Antibody (Routine testing w reflex) panel)  Tomorrow morning,   R        10/10/22 1727   10/11/22 0500  CBC  Tomorrow morning,   R        10/10/22 1727   10/11/22 0500  Comprehensive metabolic panel  Tomorrow morning,   R        10/10/22 1727   10/11/22 0500  Magnesium  Tomorrow  morning,   R        10/10/22 1727  Vitals/Pain Today's Vitals   10/10/22 1551 10/10/22 1605 10/10/22 1630  BP: 121/84  104/67  Pulse: (!) 110  87  Resp: 16  20  Temp: 99.2 F (37.3 C)    TempSrc: Oral    SpO2: 98%  98%  Weight:  114 kg   Height:  '5\' 5"'$  (1.651 m)   PainSc:  7      Isolation Precautions No active isolations  Medications Medications  methylPREDNISolone sodium succinate (SOLU-MEDROL) 500 mg in sodium chloride 0.9 % 50 mL IVPB (has no administration in time range)  ibuprofen (ADVIL) tablet 600 mg (has no administration in time range)  oxyCODONE-acetaminophen (PERCOCET/ROXICET) 5-325 MG per tablet 1 tablet (has no administration in time range)  topiramate (TOPAMAX) tablet 50 mg (has no administration in time range)  diphenhydrAMINE (BENADRYL) tablet 50 mg (has no administration in time range)  enoxaparin (LOVENOX) injection 40 mg (has no administration in time range)  acetaminophen (TYLENOL) tablet 650 mg (has no administration in time range)    Or  acetaminophen (TYLENOL) suppository 650 mg (has no administration in time range)  ondansetron (ZOFRAN) tablet 4 mg (has no administration in time range)    Or  ondansetron (ZOFRAN) injection 4 mg (has no administration in time range)  methylPREDNISolone sodium succinate (SOLU-MEDROL) 500 mg in sodium chloride 0.9 % 50 mL IVPB (has no administration in time range)  HYDROmorphone (DILAUDID) injection 1 mg (1 mg Intravenous Given 10/10/22 1646)  ondansetron (ZOFRAN) injection 4 mg (4 mg Intravenous Given 10/10/22 1646)    Mobility walks     Focused Assessments    R Recommendations: See Admitting Provider Note  Report given to:   Additional Notes:

## 2022-10-11 ENCOUNTER — Inpatient Hospital Stay (HOSPITAL_COMMUNITY): Payer: Medicaid Other

## 2022-10-11 DIAGNOSIS — H469 Unspecified optic neuritis: Secondary | ICD-10-CM | POA: Diagnosis not present

## 2022-10-11 DIAGNOSIS — H538 Other visual disturbances: Secondary | ICD-10-CM | POA: Diagnosis not present

## 2022-10-11 LAB — CBC
HCT: 40.4 % (ref 36.0–46.0)
Hemoglobin: 13.5 g/dL (ref 12.0–15.0)
MCH: 28.2 pg (ref 26.0–34.0)
MCHC: 33.4 g/dL (ref 30.0–36.0)
MCV: 84.3 fL (ref 80.0–100.0)
Platelets: 424 10*3/uL — ABNORMAL HIGH (ref 150–400)
RBC: 4.79 MIL/uL (ref 3.87–5.11)
RDW: 12.2 % (ref 11.5–15.5)
WBC: 8.6 10*3/uL (ref 4.0–10.5)
nRBC: 0 % (ref 0.0–0.2)

## 2022-10-11 LAB — COMPREHENSIVE METABOLIC PANEL
ALT: 21 U/L (ref 0–44)
AST: 24 U/L (ref 15–41)
Albumin: 3.8 g/dL (ref 3.5–5.0)
Alkaline Phosphatase: 96 U/L (ref 38–126)
Anion gap: 13 (ref 5–15)
BUN: 12 mg/dL (ref 6–20)
CO2: 22 mmol/L (ref 22–32)
Calcium: 9.4 mg/dL (ref 8.9–10.3)
Chloride: 97 mmol/L — ABNORMAL LOW (ref 98–111)
Creatinine, Ser: 0.92 mg/dL (ref 0.44–1.00)
GFR, Estimated: >60 mL/min (ref >60)
Glucose, Bld: 223 mg/dL — ABNORMAL HIGH (ref 70–99)
Potassium: 4 mmol/L (ref 3.5–5.1)
Sodium: 132 mmol/L — ABNORMAL LOW (ref 135–145)
Total Bilirubin: 0.2 mg/dL — ABNORMAL LOW (ref 0.3–1.2)
Total Protein: 7.1 g/dL (ref 6.5–8.1)

## 2022-10-11 LAB — HIV ANTIBODY (ROUTINE TESTING W REFLEX): HIV Screen 4th Generation wRfx: NONREACTIVE

## 2022-10-11 LAB — PROTIME-INR
INR: 1 (ref 0.8–1.2)
Prothrombin Time: 13.5 seconds (ref 11.4–15.2)

## 2022-10-11 LAB — RAPID URINE DRUG SCREEN, HOSP PERFORMED
Amphetamines: NOT DETECTED
Barbiturates: NOT DETECTED
Benzodiazepines: NOT DETECTED
Cocaine: NOT DETECTED
Opiates: NOT DETECTED
Tetrahydrocannabinol: NOT DETECTED

## 2022-10-11 LAB — URINALYSIS, ROUTINE W REFLEX MICROSCOPIC
Bilirubin Urine: NEGATIVE
Glucose, UA: 50 mg/dL — AB
Hgb urine dipstick: NEGATIVE
Ketones, ur: NEGATIVE mg/dL
Leukocytes,Ua: NEGATIVE
Nitrite: NEGATIVE
Protein, ur: NEGATIVE mg/dL
Specific Gravity, Urine: 1.01 (ref 1.005–1.030)
pH: 5 (ref 5.0–8.0)

## 2022-10-11 LAB — MAGNESIUM: Magnesium: 1.9 mg/dL (ref 1.7–2.4)

## 2022-10-11 LAB — SEDIMENTATION RATE: Sed Rate: 21 mm/hr (ref 0–22)

## 2022-10-11 LAB — C-REACTIVE PROTEIN: CRP: 0.7 mg/dL (ref <1.0)

## 2022-10-11 MED ORDER — MIDAZOLAM HCL 2 MG/2ML IJ SOLN
2.0000 mg | Freq: Once | INTRAMUSCULAR | Status: AC | PRN
  Administered 2022-10-11: 2 mg via INTRAVENOUS
  Filled 2022-10-11: qty 2

## 2022-10-11 MED ORDER — HYDROMORPHONE HCL 1 MG/ML IJ SOLN
1.0000 mg | Freq: Once | INTRAMUSCULAR | Status: AC
  Administered 2022-10-11: 1 mg via INTRAVENOUS
  Filled 2022-10-11: qty 1

## 2022-10-11 MED ORDER — LORAZEPAM 2 MG/ML IJ SOLN
2.0000 mg | Freq: Once | INTRAMUSCULAR | Status: AC
  Filled 2022-10-11: qty 1

## 2022-10-11 MED ORDER — MAGNESIUM SULFATE 2 GM/50ML IV SOLN
2.0000 g | Freq: Once | INTRAVENOUS | Status: AC
  Administered 2022-10-11: 2 g via INTRAVENOUS
  Filled 2022-10-11: qty 50

## 2022-10-11 MED ORDER — LIDOCAINE HCL (PF) 2 % IJ SOLN
5.0000 mL | INTRAMUSCULAR | Status: AC
  Administered 2022-10-11: 5 mL
  Filled 2022-10-11: qty 5

## 2022-10-11 MED ORDER — DIPHENHYDRAMINE HCL 50 MG/ML IJ SOLN
12.5000 mg | Freq: Four times a day (QID) | INTRAMUSCULAR | Status: DC
  Administered 2022-10-11 – 2022-10-12 (×6): 12.5 mg via INTRAVENOUS
  Filled 2022-10-11 (×6): qty 1

## 2022-10-11 MED ORDER — LIDOCAINE HCL (PF) 1 % IJ SOLN
INTRAMUSCULAR | Status: AC
  Filled 2022-10-11: qty 5

## 2022-10-11 MED ORDER — LIDOCAINE HCL (CARDIAC) PF 100 MG/5ML IV SOSY
PREFILLED_SYRINGE | INTRAVENOUS | Status: AC
  Filled 2022-10-11: qty 5

## 2022-10-11 MED ORDER — SODIUM CHLORIDE 0.9 % IV BOLUS
1000.0000 mL | Freq: Once | INTRAVENOUS | Status: AC
  Administered 2022-10-11: 1000 mL via INTRAVENOUS

## 2022-10-11 MED ORDER — SODIUM CHLORIDE 0.9 % IV BOLUS: 1000.0000 mL | Freq: Two times a day (BID) | INTRAVENOUS | Status: DC

## 2022-10-11 MED ORDER — LORAZEPAM 2 MG/ML IJ SOLN
INTRAMUSCULAR | Status: AC
  Administered 2022-10-11: 2 mg via INTRAVENOUS
  Filled 2022-10-11: qty 1

## 2022-10-11 MED ORDER — IBUPROFEN 200 MG PO TABS: 600.0000 mg | ORAL_TABLET | Freq: Three times a day (TID) | ORAL | Status: DC | PRN

## 2022-10-11 MED ORDER — PROCHLORPERAZINE EDISYLATE 10 MG/2ML IJ SOLN
5.0000 mg | Freq: Four times a day (QID) | INTRAMUSCULAR | Status: DC
  Administered 2022-10-11 – 2022-10-12 (×6): 5 mg via INTRAVENOUS
  Filled 2022-10-11 (×6): qty 2

## 2022-10-11 MED ORDER — SENNOSIDES-DOCUSATE SODIUM 8.6-50 MG PO TABS
1.0000 | ORAL_TABLET | Freq: Two times a day (BID) | ORAL | Status: DC
  Administered 2022-10-11 – 2022-10-12 (×2): 1 via ORAL
  Filled 2022-10-11 (×2): qty 1

## 2022-10-11 MED ORDER — MORPHINE SULFATE (PF) 2 MG/ML IV SOLN: 1.0000 mg | Freq: Once | INTRAVENOUS | Status: DC

## 2022-10-11 MED ORDER — POLYETHYLENE GLYCOL 3350 17 G PO PACK
17.0000 g | PACK | Freq: Every day | ORAL | Status: DC | PRN
  Administered 2022-10-11: 17 g via ORAL
  Filled 2022-10-11: qty 1

## 2022-10-11 MED ORDER — KETOROLAC TROMETHAMINE 15 MG/ML IJ SOLN
15.0000 mg | Freq: Four times a day (QID) | INTRAMUSCULAR | Status: DC
  Administered 2022-10-11 – 2022-10-12 (×6): 15 mg via INTRAVENOUS
  Filled 2022-10-11 (×6): qty 1

## 2022-10-11 NOTE — Progress Notes (Signed)
Request for image guided Lumbar puncture.   Patient presented to fluoro suite for Lumbar Puncture. Patient refusing to stay still and states that she is "done. I cannot stay still. I do not want this" Patient sent back to her room to discuss possible alternatives with her Team.   Risks and benefits of Lumbar  Puncture placement was discussed with the patient including, but not limited to, pain, infection, bleeding, significant bleeding causing loss or decrease in renal function or damage to adjacent structures.   Patient did consent for procedure. Procedure terminated per Patient's request.  Should the patient decide to more forward and have the procedure  performed please re- consult Fluoro.

## 2022-10-11 NOTE — Progress Notes (Signed)
PROGRESS NOTE    Stacy Deleon  VZD:[id number] DOB: July 10, 1981 DOA: 10/10/2022 PCP: Ladora Daniel, PA-C   Brief Narrative:   42 y.o. female with medical history significant of PCOS, optic neuritis of unknown etiology followed closely by ophthalmology, multiple family members with multiple sclerosis was seen at her local ophthalmology office by retina specialist/Dr. Groat's office for increasing left eye pain with decreased vision along with some blurring of vision and occasional double vision for the last several days.   ED provider spoke to on-call ophthalmologist who recommended high-dose steroids, neurology evaluation and admission.  Neurology recommended MRI brain and cervical spine.  Assessment & Plan:   Left eye pain with concerns for optic neuritis/multiple sclerosis flare -Presented with few days of worsening left eye pain along with intermittent tingling and discomfort in her left arm and leg. -ED provider spoke to on-call ophthalmologist who recommended high-dose steroids, neurology evaluation and admission along with outpatient follow-up with ophthalmology in 6 weeks.   -Currently on Solu-Medrol 500 mg IV twice a day.  MRI of brain and cervical spine negative for any acute abnormality -Neurology follow.  Considering LP.  Will DC Lovenox. -Fall precautions.  PT eval.   Leukocytosis -Reactive.  Resolved.  Hypokalemia -Improved  Thrombocytosis -Possibly reactive.  Monitor intermittently.   PCOS -continue metformin   Depression -continue lexapro   Morbid obesity -outpatient follow-up     DVT prophylaxis: DC Lovenox.  Start SCDs Code Status: Full Family Communication: None at bedside Disposition Plan: Status is: Inpatient Remains inpatient appropriate because: Of severity of illness  Consultants: Neurology  Procedures: None  Antimicrobials: None   Subjective: Patient seen and examined at bedside.  Still complains of left eye pain.  No fever, vomiting,  seizures reported.  Objective: Vitals:   10/10/22 2021 10/10/22 2352 10/11/22 0403 10/11/22 0724  BP: 124/71 109/64 107/71 104/70  Pulse: 85 83 77 90  Resp: 14 14 18 15   Temp: 98 F (36.7 C) 98.2 F (36.8 C) 98.1 F (36.7 C) 98.5 F (36.9 C)  TempSrc: Oral Oral Oral Oral  SpO2: 95% 92% 98% 97%  Weight:      Height:        Intake/Output Summary (Last 24 hours) at 10/11/2022 0754 Last data filed at 10/11/2022 0648 Gross per 24 hour  Intake 1587.47 ml  Output --  Net 1587.47 ml   Filed Weights   10/10/22 1605  Weight: 114 kg    Examination:  General: On room air.  No distress.  respiratory: Decreased breath sounds at bases bilaterally with no wheezing CVS: Currently rate controlled; S1-S2 heard  abdominal: Soft, morbidly obese, nontender, slightly distended, no organomegaly;  bowel sounds are heard  extremities: Trace lower extremity edema; no clubbing.       Data Reviewed: I have personally reviewed following labs and imaging studies  CBC: Recent Labs  Lab 10/10/22 1640 10/11/22 0435  WBC 12.8* 8.6  NEUTROABS 7.9*  --   HGB 13.5 13.5  HCT 40.2 40.4  MCV 85.4 84.3  PLT 425* 424*   Basic Metabolic Panel: Recent Labs  Lab 10/10/22 1640 10/11/22 0435  NA 136 132*  K 3.3* 4.0  CL 101 97*  CO2 25 22  GLUCOSE 99 223*  BUN 12 12  CREATININE 0.94 0.92  CALCIUM 9.5 9.4  MG  --  1.9   GFR: Estimated Creatinine Clearance: 102.4 mL/min (by C-G formula based on SCr of 0.92 mg/dL). Liver Function Tests: Recent Labs  Lab 10/11/22 [phone]  AST 24  ALT 21  ALKPHOS 96  BILITOT 0.2*  PROT 7.1  ALBUMIN 3.8   No results for input(s): "LIPASE", "AMYLASE" in the last 168 hours. No results for input(s): "AMMONIA" in the last 168 hours. Coagulation Profile: No results for input(s): "INR", "PROTIME" in the last 168 hours. Cardiac Enzymes: No results for input(s): "CKTOTAL", "CKMB", "CKMBINDEX", "TROPONINI" in the last 168 hours. BNP (last 3 results) No results  for input(s): "PROBNP" in the last 8760 hours. HbA1C: No results for input(s): "HGBA1C" in the last 72 hours. CBG: No results for input(s): "GLUCAP" in the last 168 hours. Lipid Profile: No results for input(s): "CHOL", "HDL", "LDLCALC", "TRIG", "CHOLHDL", "LDLDIRECT" in the last 72 hours. Thyroid Function Tests: No results for input(s): "TSH", "T4TOTAL", "FREET4", "T3FREE", "THYROIDAB" in the last 72 hours. Anemia Panel: No results for input(s): "VITAMINB12", "FOLATE", "FERRITIN", "TIBC", "IRON", "RETICCTPCT" in the last 72 hours. Sepsis Labs: No results for input(s): "PROCALCITON", "LATICACIDVEN" in the last 168 hours.  No results found for this or any previous visit (from the past 240 hour(s)).       Radiology Studies: MR CERVICAL SPINE W WO CONTRAST  Result Date: 10/10/2022 CLINICAL DATA:  Initial evaluation for cervical radiculopathy. EXAM: MRI CERVICAL SPINE WITHOUT AND WITH CONTRAST TECHNIQUE: Multiplanar and multiecho pulse sequences of the cervical spine, to include the craniocervical junction and cervicothoracic junction, were obtained without and with intravenous contrast. CONTRAST:  10mL GADAVIST GADOBUTROL 1 MMOL/ML IV SOLN COMPARISON:  Prior radiograph from 03/17/2015. FINDINGS: Alignment: Straightening of the normal cervical lordosis. No listhesis. Vertebrae: Vertebral body height maintained without acute or chronic fracture. Diffuse loss of normal bone marrow signal, nonspecific but can be seen with anemia, smoking, obesity, and infiltrative/myelofibrotic marrow processes. No focal marrow replacing lesion. No abnormal marrow edema or enhancement. Cord: Normal signal and morphology.  No abnormal enhancement. Posterior Fossa, vertebral arteries, paraspinal tissues: Unremarkable. Disc levels: No significant disc pathology seen within the cervical spine. No disc bulge or focal disc herniation. No significant facet disease. No canal or neural foraminal stenosis or evidence for neural  impingement. IMPRESSION: 1. Normal MRI appearance of the cervical spinal cord. No evidence for demyelinating disease. 2. Diffuse loss of normal bone marrow signal, nonspecific but can be seen with anemia, smoking, obesity, and infiltrative/myelofibrotic marrow processes. 3. Otherwise unremarkable and normal MRI of the cervical spine. No significant disc pathology, stenosis, or evidence for neural impingement. Electronically Signed   By: Rise Mu M.D.   On: 10/10/2022 23:58   MR Brain W and Wo Contrast  Result Date: 10/10/2022 CLINICAL DATA:  Initial evaluation for numbness, paresthesias. EXAM: MRI HEAD WITHOUT AND WITH CONTRAST TECHNIQUE: Multiplanar, multiecho pulse sequences of the brain and surrounding structures were obtained without and with intravenous contrast. CONTRAST:  10mL GADAVIST GADOBUTROL 1 MMOL/ML IV SOLN COMPARISON:  None Available. FINDINGS: Brain: Cerebral volume within normal limits for age. No focal parenchymal signal abnormality. No abnormal foci of restricted diffusion to suggest acute or subacute ischemia. Gray-white matter differentiation well maintained. No encephalomalacia to suggest chronic cortical infarction or other insult. No foci of susceptibility artifact indicative of acute or chronic intracranial blood products. No mass lesion, midline shift or mass effect. Ventricles normal in size and morphology without hydrocephalus. No extra-axial fluid collection. Pituitary gland and suprasellar region within normal limits. No abnormal enhancement. Vascular: Major intracranial vascular flow voids are well maintained. Skull and upper cervical spine: Craniocervical junction within normal limits. Visualized upper cervical spine demonstrates no significant finding. Diffuse  loss of normal bone marrow signal, nonspecific but can be seen with anemia, smoking, obesity, and infiltrative/myelofibrotic marrow processes. No scalp soft tissue abnormality. Sinuses/Orbits: Globes and orbital  soft tissues are within normal limits. Paranasal sinuses are largely clear. No significant mastoid effusion. Other: None. IMPRESSION: Normal brain MRI. No acute intracranial abnormality or findings to explain patient's symptoms. Electronically Signed   By: Rise Mu M.D.   On: 10/10/2022 23:38        Scheduled Meds:  ketorolac  15 mg Intravenous Q6H   And   diphenhydrAMINE  12.5 mg Intravenous Q6H   And   prochlorperazine  5 mg Intravenous Q6H   escitalopram  20 mg Oral Daily   influenza vac split quadrivalent PF  0.5 mL Intramuscular Tomorrow-1000   metFORMIN  1,000 mg Oral Q supper   pneumococcal 20-valent conjugate vaccine  0.5 mL Intramuscular Tomorrow-1000   Continuous Infusions:  methylPREDNISolone (SOLU-MEDROL) injection 500 mg (10/11/22 0514)          Glade Lloyd, MD [facility] 10/11/2022, 7:54 AM

## 2022-10-11 NOTE — Plan of Care (Signed)
Same day note Seen and examiend. No APD on exam. No motor deficits. Sensory deficits going on for year. Bladder hesitancy also chronic. MRI brain and C-spine  personally reviewed - normal. Not sure if this is MS or headache related blurred vision vs IIHT  Plan: -Lp  -Continue IVMP for now - she got 559m daily x3 in 2022 with good relief of symptoms in the eye and return of vision. Has had 2 doses of 5037mIVMP in the hospital. -Will do 500 mg one  more dose. -if prelim LP labs are negative, can do the third dose this afternoon and d/c for outpatient follow up  -- AsAmie PortlandMD Neurologist [facility] Pager: [phone]

## 2022-10-11 NOTE — Procedures (Signed)
LUMBAR PUNCTURE (SPINAL TAP) PROCEDURE NOTE  Indication: Optic neuritis   Proceduralists: Jerelyn Charles MD, D. Rogers Blocker NP   Risks, benefits and alternatives of the procedure were dicussed with the patient including but not limited to post-LP headache, bleeding, infection, weakness/numbness of legs(radiculopathy), death.    Consent obtained from: Patient   Procedure Note The patient was prepped and draped, and using sterile technique a 20 gauge quinke spinal needle was inserted in the L4-5 space.  No return on 3 tries.  Patient extremely uncomfortable and yelling.  Procedure was aborted with plan to request fluoroscopy guided LP.     -- Amie Portland, MD Neurologist [facility] Pager: [phone]

## 2022-10-11 NOTE — Consult Note (Signed)
Neurology Consultation Reason for Consult: left eye pain and vision loss Requesting Physician: Aline August  CC: Left eye optic neuritis  History is obtained from: Patient and chart review  HPI: Stacy Deleon is a 42 y.o. female with a past medical history significant for possible left eye optic neuritis, ADHD, anxiety, lumbar degenerative disc disease s/p L4-L5 fusion, migraine headaches, PCOS, vertigo, BMI 41.82  She reports she was in her usual state of health until 2/6 when she began to have increasing headache when laying down.  Subsequently she noted she was having left retro-orbital eye pressure, with light sensitivity, color distortion, light flashes, and pain with eye movement as well as some left lateral eye pain.  Per chart review she also was reporting some left arm and leg tingling although to me she reports a more distal symmetric intermittent tingling pattern and denies focality specifically.  She was seen by her primary care physician on 2/7 who initiated prednisone, which she took 40 mg yesterday.  She feels that she is not seeing colors is clearly in the left eye and visual acuity testing at her ophthalmologist today was notable for 20/200 vision in the left eye without relative afferent pupillary defect, and no other objective findings on examination on my brief review of his notes.  Regarding her prior episode this was in November 2022, she had left eye pain at that time as well (incorrectly documented as right eye pain and some notes).   She was treated with 500 mg Solu-Medrol for 3 days.  MRI brain was negative for any pathology.   Lumbar puncture was performed with opening pressure of 20.6, CSF studies with red blood cells 531/38, WBC 2/2, glucose 88, 0 oligoclonal bands, normal myelin basic protein (2.9, reference range 0-3.7).   Serum labs were notable for CRP of 6.4 (elevated), with ESR normal at 26 On follow-up with ophthalmology she was noted to have left eye visual  acuity of 20/60 without a left relative afferent pupillary defect at that time as well.  She was referred to Dr. Felecia Shelling for further neurological follow-up but due to lack of insurance did not follow-up  ROS: All other review of systems was negative except as noted in the HPI below:  -Headache that she reports is worse when she is laying down at night and wakes her out of sleep, ongoing for the past 9 months, previously waking her up 5 out of 7 nights a week but persistent since Monday.  Not noticeably worse with Valsalva  -3 months of sweating at night, to the point that she needs to change clothes or sheets  -No weight change, no cough, no fevers when she checks her temperature  -Urinary urgency without burning or frequency  -Intermittent shortness of breath for 3 to 4 minutes at a time without clear trigger happening in all different positions  -Hiccups at bedtime 4 out of 7 days a week lasting 10 to 60 minutes for the past few months, always around bedtime  Past Medical History:  Diagnosis Date   ADHD (attention deficit hyperactivity disorder)    Anxiety    Bronchitis    Depression    GERD (gastroesophageal reflux disease)    Headache    Incontinence of urine    Neuromuscular disorder (HCC)    MIGRAINES    Optic neuritis    left eye- trouble with vision and eye pain   Polycystic ovary syndrome    PONV (postoperative nausea and vomiting)    "I  wake up really hot"   Past Surgical History:  Procedure Laterality Date   ABDOMINAL EXPOSURE N/A 03/13/2016   Procedure: ABDOMINAL EXPOSURE;  Surgeon: Rosetta Posner, MD;  Location: [facility] OR;  Service: Vascular;  Laterality: N/A;   ABDOMINAL HYSTERECTOMY     ANTERIOR LUMBAR FUSION N/A 03/13/2016   Procedure: ANTERIOR LUMBAR FUSION L4-L5    (1 LEVEL)  ALIF;  Surgeon: Melina Schools, MD;  Location: [place];  Service: Orthopedics;  Laterality: N/A;   HERNIA REPAIR     LUMBAR LAMINECTOMY/DECOMPRESSION MICRODISCECTOMY Left 10/13/2014   Procedure: LEFT  L4-L5 DISCECTOMY    (1 LEVEL);  Surgeon: Melina Schools, MD;  Location: [place];  Service: Orthopedics;  Laterality: Left;   ORIF ANKLE FRACTURE Left 03/26/2022   Procedure: OPEN TREATMENT OF LEFT LATERAL MALLEOLUS;  Surgeon: Erle Crocker, MD;  Location: [place];  Service: Orthopedics;  Laterality: Left;  LENGHT OF SURGERY: 90 MINUTES   PLANTAR FASCIA SURGERY     SYNDESMOSIS REPAIR Left 03/26/2022   Procedure: SYNDESMOSIS;  Surgeon: Erle Crocker, MD;  Location: [place];  Service: Orthopedics;  Laterality: Left;   TUBAL LIGATION     Current Outpatient Medications  Medication Instructions   diphenhydrAMINE (BENADRYL) 100 mg, Oral, Every 6 hours PRN   escitalopram (LEXAPRO) 20 mg, Oral, BH-each morning   ibuprofen (ADVIL) 400 mg, Oral, Every 8 hours PRN   metFORMIN (GLUCOPHAGE-XR) 1,000 mg, Oral, Every evening   oxyCODONE-acetaminophen (PERCOCET) 5-325 MG tablet 1 tablet, Oral, Every 4 hours PRN   oxyCODONE-acetaminophen (PERCOCET/ROXICET) 5-325 MG tablet 1 tablet, Oral, Every 6 hours PRN   Topamax 50 mg, Daily at bedtime   Family History  Problem Relation Age of Onset   Hypertension Father    Fibromyalgia Father    Depression Father    Cancer Father    Bipolar disorder Brother    Fibromyalgia Brother   Regarding family history of multiple sclerosis she notes her sister was recently diagnosed with multiple white matter lesions in her brain, she has a maternal aunt as well as a paternal aunt as well as maternal grandmother with diagnoses of MS  Social History:  reports that she quit smoking about 9 years ago. Her smoking use included cigarettes. She has a 6.00 pack-year smoking history. She has never used smokeless tobacco. She reports that she does not drink alcohol and does not use drugs.   Exam: Current vital signs: BP 107/71 (BP Location: Left Arm)   Pulse 77   Temp 98.1 F (36.7 C) (Oral)   Resp 18   Ht 5' 5"$  (1.651 m)   Wt 114 kg   LMP 04/18/2017   SpO2 98%   BMI  41.82 kg/m  Vital signs in last 24 hours: Temp:  [98 F (36.7 C)-99.2 F (37.3 C)] 98.1 F (36.7 C) (02/09 0403) Pulse Rate:  [77-110] 77 (02/09 0403) Resp:  [14-22] 18 (02/09 0403) BP: (104-136)/(64-86) 107/71 (02/09 0403) SpO2:  [92 %-98 %] 98 % (02/09 0403) Weight:  [id number] kg] 114 kg (02/08 1605)   Physical Exam  Constitutional: Appears well-developed and well-nourished.  Psych: Affect appropriate to situation, pleasant and cooperative Eyes: No scleral injection HENT: No oropharyngeal obstruction.  MSK: no joint deformities.  Cardiovascular: Normal rate and regular rhythm.  Perfusing extremities well Respiratory: Effort normal, non-labored breathing GI: Soft.  No distension. There is no tenderness.  Skin: Warm dry and intact visible skin  Neuro: Mental Status: Patient is awake, alert, oriented to person, place, situation and  provides an excellent detailed history with very good recall of what is documented in prior notes No signs of aphasia or neglect Cranial Nerves: II: Pupils are equal, round, and reactive to light without an afferent pupillary defect.  I did not independently check visual acuity but this was documented as 20/200 OS by ophthalmology on 2/8 III,IV, VI: EOMI without ptosis.  On history she reported vertical diplopia, on examination she reported lateral diplopia which improved in downgaze but otherwise was constant V: Facial sensation is symmetric light touch VII: Facial movement is symmetric.  VIII: hearing is intact to voice, tuning fork testing she reports slightly reduced hearing in the left ear but notes that this is a chronic problem X: Uvula elevates symmetrically XI: Shoulder shrug is symmetric. XII: tongue is midline without atrophy or fasciculations.  Motor: Tone is normal. Bulk is normal. 5/5 strength was present in all four extremities. Sensory: Sensation is symmetric to light touch and temperature in the arms and legs.  She reports  length-dependent loss of temperature sensation in all extremities Deep Tendon Reflexes: 2+ and symmetric in the brachioradialis and patellae.  Cerebellar: FNF and HKS are intact bilaterally Gait:  Deferred   I have reviewed labs in epic and the results pertinent to this consultation are:  Basic Metabolic Panel: Recent Labs  Lab 10/10/22 1640  NA 136  K 3.3*  CL 101  CO2 25  GLUCOSE 99  BUN 12  CREATININE 0.94  CALCIUM 9.5    CBC: Recent Labs  Lab 10/10/22 1640  WBC 12.8*  NEUTROABS 7.9*  HGB 13.5  HCT 40.2  MCV 85.4  PLT 425*    Coagulation Studies: No results for input(s): "LABPROT", "INR" in the last 72 hours.    I have reviewed the images obtained:  MRI brain personally reviewed, agree with radiology Normal brain MRI. No acute intracranial abnormality or findings to explain patient's symptoms.   MRI cervical spine personally reviewed, agree with radiology 1. Normal MRI appearance of the cervical spinal cord. No evidence for demyelinating disease. 2. Diffuse loss of normal bone marrow signal, nonspecific but can be seen with anemia, smoking, obesity, and infiltrative/myelofibrotic marrow processes. 3. Otherwise unremarkable and normal MRI of the cervical spine. No significant disc pathology, stenosis, or evidence for neural impingement.   Impression: Patient presenting with recurrent left-sided headache and vision changes.  Some features of headache are concerning for high intracranial pressure headache, particularly that it wakes her out of sleep, and therefore lumbar puncture with opening pressure should be obtained.  Interestingly her described vision changes are not associated with an objective finding on examination (relative afferent pupillary defect would be expected given the degree of the vision impairment she describes).  Coupled with her remarkably normal MRI brain without any evidence of white matter disease, as well as prior lumbar puncture  negative for oligoclonal bands, multiple sclerosis is felt to be less likely.  Query whether this could be an atypical migraine with prolonged aura, as there are certainly migrainous features.  Will treat for migraine with aura with migraine cocktails while continuing steroids for now.  Recommendations: -Continue methylprednisolone 500 mg twice daily for now; so far she has received 1 dose at 5:30 PM on 2/8 with next doses scheduled for 6 AM and 6 PM -Lumbar puncture with opening pressure, protein, glucose, cell counts in tubes 1 and 4, bacterial culture and smear, meningitis/encephalitis panel, oligoclonal bands, IgG index -Midazolam 2 mg IV 1 dose as needed for lumbar puncture sedation -  Migraine cocktails with ketorolac 15 mg, Compazine 5 mg, Benadryl 12.5 mg; dose may be increased depending on side effects and response scheduled every 6 hours, -Adjunctive headache treatment with IV magnesium 2 g x 1 dose (can be repeated every 12 or every 24 hours), 1 L normal saline (also can be repeated every 12-24 hours) -Minimize opiates due to significant risk of rebound headache -We will also reach out to Dr. Katy Fitch during the day to confirm there are no abnormalities other than visual acuity change detected on his thorough examination -Neurology will follow along  [place] [phone] Available 7 PM to 7 AM, outside of these hours please call Neurologist on call as listed on Amion.

## 2022-10-11 NOTE — TOC Initial Note (Addendum)
Transition of Care [facility]) - Initial/Assessment Note    Patient Details  Name: Stacy Deleon MRN: [id number] Date of Birth: [birth year]  Transition of Care [facility]) CM/SW Contact:    Kermit Balo, RN Phone Number: 10/11/2022, 11:13 AM  Clinical Narrative:      PCP:      Ladora Daniel        Pt is from home with family. She states her mother in law assists in her care at home.  She drives self or her daughter provides needed transportation. She manages her own medications and denies any issues. TOC following for d/c needs.   Expected Discharge Plan: Home/Self Care Barriers to Discharge: Continued Medical Work up   Patient Goals and CMS Choice            Expected Discharge Plan and Services       Living arrangements for the past 2 months: Single Family Home                                      Prior Living Arrangements/Services Living arrangements for the past 2 months: Single Family Home Lives with:: Significant Other, Adult Children (MIL) Patient language and need for interpreter reviewed:: Yes Do you feel safe going back to the place where you live?: Yes        Care giver support system in place?: Yes (comment) Current home services: DME (cane/ walker/ shower seat/ handicap bathroom/ wheelchair) Criminal Activity/Legal Involvement Pertinent to Current Situation/Hospitalization: No - Comment as needed  Activities of Daily Living Home Assistive Devices/Equipment: None ADL Screening (condition at time of admission) Patient's cognitive ability adequate to safely complete daily activities?: Yes Is the patient deaf or have difficulty hearing?: No Does the patient have difficulty seeing, even when wearing glasses/contacts?: Yes (left eye blurred and double vision) Does the patient have difficulty concentrating, remembering, or making decisions?: Yes Patient able to express need for assistance with ADLs?: Yes Does the patient have difficulty dressing or bathing?:  No Independently performs ADLs?: Yes (appropriate for developmental age) Does the patient have difficulty walking or climbing stairs?: No Weakness of Legs: None Weakness of Arms/Hands: None  Permission Sought/Granted                  Emotional Assessment Appearance:: Appears stated age Attitude/Demeanor/Rapport: Engaged Affect (typically observed): Accepting Orientation: : Oriented to Self, Oriented to Place, Oriented to  Time, Oriented to Situation   Psych Involvement: No (comment)  Admission diagnosis:  Multiple sclerosis (HCC) [G35] Optic neuritis [H46.9] Patient Active Problem List   Diagnosis Date Noted   Left optic neuritis 10/11/2022   Back pain 03/13/2016   Lumbar disc herniation 10/13/2014   ADD (attention deficit disorder) 05/24/2014   DDD (degenerative disc disease), lumbar 05/24/2014   Routine general medical examination at a health care facility 05/24/2014   Affective disorder (HCC) 05/24/2014   Depression, 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 [place] [address] Phone: [phone] Fax: [phone]     Social Determinants of Health (SDOH) Social History: SDOH Screenings   Food Insecurity: No Food Insecurity (10/10/2022)  Housing: Low Risk  (10/10/2022)  Transportation Needs: No  Transportation Needs (10/10/2022)  Utilities: Not At Risk (10/10/2022)  Tobacco Use: Medium Risk (10/10/2022)   SDOH Interventions:     Readmission Risk Interventions     No data to display

## 2022-10-11 NOTE — Plan of Care (Signed)
  Problem: Education: Goal: Knowledge of General Education information will improve Description: Including pain rating scale, medication(s)/side effects and non-pharmacologic comfort measures Outcome: Progressing   Problem: Clinical Measurements: Goal: Ability to maintain clinical measurements within normal limits will improve Outcome: Progressing   Problem: Clinical Measurements: Goal: Will remain free from infection Outcome: Progressing   Problem: Clinical Measurements: Goal: Diagnostic test results will improve Outcome: Progressing   Problem: Activity: Goal: Risk for activity intolerance will decrease Outcome: Progressing   Problem: Nutrition: Goal: Adequate nutrition will be maintained Outcome: Progressing

## 2022-10-12 NOTE — Discharge Summary (Addendum)
Physician Discharge Summary  Stacy Deleon [id number]:[id number] DOB: 12/25/1980 DOA: 10/10/2022  PCP: Ladora Daniel, PA-C  Admit date: 10/10/2022 Discharge date: 10/12/2022  Admitted From: Home Disposition: Home  Recommendations for Outpatient Follow-up:  Follow up with PCP in 1 week  Outpatient follow-up with neurology and ophthalmology Follow up in ED if symptoms worsen or new appear   Home Health: No Equipment/Devices: None  Discharge Condition: Stable CODE STATUS: Full Diet recommendation: Regular  Brief/Interim Summary: 42 y.o. female with medical history significant of PCOS, optic neuritis of unknown etiology followed closely by ophthalmology, multiple family members with multiple sclerosis was seen at her local ophthalmology office by retina specialist/Dr. Groat's office for increasing left eye pain with decreased vision along with some blurring of vision and occasional double vision for the last several days.   ED provider spoke to on-call ophthalmologist who recommended high-dose steroids, neurology evaluation and admission.  Neurology recommended MRI brain and cervical spine.  MRI was negative for acute abnormality.  Patient received few days of IV steroids and her symptoms are slightly improving.  Neurology has cleared the patient for discharge home today.  Discharge patient home today with outpatient follow-up with neurology and ophthalmology.  Discharge Diagnoses:  Left eye pain  -Presented with few days of worsening left eye pain along with intermittent tingling and discomfort in her left arm and leg. -ED provider spoke to on-call ophthalmologist who recommended high-dose steroids, neurology evaluation and admission along with outpatient follow-up with ophthalmology in 6 weeks.   -Currently on Solu-Medrol 500 mg IV twice a day.  MRI of brain and cervical spine negative for any acute abnormality -Neurology following and had recommended LP.  IR could not perform LP on 10/11/2022 because  she could not lay still and subsequently refused the procedure -symptoms are slightly improving.  Neurology has cleared the patient for discharge home today.  Neurology thinks that her symptoms were not consistent with optic neuritis. -Discharge patient home today with outpatient follow-up with neurology and ophthalmology.  No need for steroids on discharge as per neurology.   Leukocytosis -Reactive.  Resolved.   Hypokalemia -Improved   Hyponatremia -No labs today.   Thrombocytosis -Possibly reactive.     PCOS -continue metformin   Depression -continue lexapro   Morbid obesity -outpatient follow-up   Discharge Instructions  Discharge Instructions     Ambulatory referral to Neurology   Complete by: As directed    An appointment is requested in approximately: 1 week   Diet general   Complete by: As directed    Increase activity slowly   Complete by: As directed       Allergies as of 10/12/2022       Reactions   Codeine Itching   Oxycodone Itching, Other (See Comments)   Takes Benadryl with Percocet to help with itching   Other Anxiety, Other (See Comments)   The patient asked if she is given anything iv push, for it to be done slowly because if it's too fast she has terrible anxiety   Metoclopramide Anxiety        Medication List     STOP taking these medications    diphenhydrAMINE 25 MG tablet Commonly known as: BENADRYL   oxyCODONE-acetaminophen 5-325 MG tablet Commonly known as: Percocet   Topamax 50 MG tablet Generic drug: topiramate       TAKE these medications    escitalopram 20 MG tablet Commonly known as: LEXAPRO Take 20 mg by mouth every morning.   ibuprofen  200 MG tablet Commonly known as: ADVIL Take 400 mg by mouth every 8 (eight) hours as needed for mild pain or headache.   metFORMIN 500 MG 24 hr tablet Commonly known as: GLUCOPHAGE-XR Take 1,000 mg by mouth every evening.        Follow-up Information     Ladora Daniel,  PA-C. Schedule an appointment as soon as possible for a visit in 1 week(s).   Specialty: Physician Assistant Contact information: [address] [place] [address] [phone]         Ophthalmologist. Schedule an appointment as soon as possible for a visit in 1 week(s).                 Allergies  Allergen Reactions   Codeine Itching   Oxycodone Itching and Other (See Comments)    Takes Benadryl with Percocet to help with itching   Other Anxiety and Other (See Comments)    The patient asked if she is given anything iv push, for it to be done slowly because if it's too fast she has terrible anxiety   Metoclopramide Anxiety    Consultations: Neurology   Procedures/Studies: MR CERVICAL SPINE W WO CONTRAST  Result Date: 10/10/2022 CLINICAL DATA:  Initial evaluation for cervical radiculopathy. EXAM: MRI CERVICAL SPINE WITHOUT AND WITH CONTRAST TECHNIQUE: Multiplanar and multiecho pulse sequences of the cervical spine, to include the craniocervical junction and cervicothoracic junction, were obtained without and with intravenous contrast. CONTRAST:  10mL GADAVIST GADOBUTROL 1 MMOL/ML IV SOLN COMPARISON:  Prior radiograph from 03/17/2015. FINDINGS: Alignment: Straightening of the normal cervical lordosis. No listhesis. Vertebrae: Vertebral body height maintained without acute or chronic fracture. Diffuse loss of normal bone marrow signal, nonspecific but can be seen with anemia, smoking, obesity, and infiltrative/myelofibrotic marrow processes. No focal marrow replacing lesion. No abnormal marrow edema or enhancement. Cord: Normal signal and morphology.  No abnormal enhancement. Posterior Fossa, vertebral arteries, paraspinal tissues: Unremarkable. Disc levels: No significant disc pathology seen within the cervical spine. No disc bulge or focal disc herniation. No significant facet disease. No canal or neural foraminal stenosis or evidence for neural impingement. IMPRESSION: 1.  Normal MRI appearance of the cervical spinal cord. No evidence for demyelinating disease. 2. Diffuse loss of normal bone marrow signal, nonspecific but can be seen with anemia, smoking, obesity, and infiltrative/myelofibrotic marrow processes. 3. Otherwise unremarkable and normal MRI of the cervical spine. No significant disc pathology, stenosis, or evidence for neural impingement. Electronically Signed   By: Rise Mu M.D.   On: 10/10/2022 23:58   MR Brain W and Wo Contrast  Result Date: 10/10/2022 CLINICAL DATA:  Initial evaluation for numbness, paresthesias. EXAM: MRI HEAD WITHOUT AND WITH CONTRAST TECHNIQUE: Multiplanar, multiecho pulse sequences of the brain and surrounding structures were obtained without and with intravenous contrast. CONTRAST:  10mL GADAVIST GADOBUTROL 1 MMOL/ML IV SOLN COMPARISON:  None Available. FINDINGS: Brain: Cerebral volume within normal limits for age. No focal parenchymal signal abnormality. No abnormal foci of restricted diffusion to suggest acute or subacute ischemia. Gray-white matter differentiation well maintained. No encephalomalacia to suggest chronic cortical infarction or other insult. No foci of susceptibility artifact indicative of acute or chronic intracranial blood products. No mass lesion, midline shift or mass effect. Ventricles normal in size and morphology without hydrocephalus. No extra-axial fluid collection. Pituitary gland and suprasellar region within normal limits. No abnormal enhancement. Vascular: Major intracranial vascular flow voids are well maintained. Skull and upper cervical spine: Craniocervical junction within normal limits. Visualized  upper cervical spine demonstrates no significant finding. Diffuse loss of normal bone marrow signal, nonspecific but can be seen with anemia, smoking, obesity, and infiltrative/myelofibrotic marrow processes. No scalp soft tissue abnormality. Sinuses/Orbits: Globes and orbital soft tissues are within  normal limits. Paranasal sinuses are largely clear. No significant mastoid effusion. Other: None. IMPRESSION: Normal brain MRI. No acute intracranial abnormality or findings to explain patient's symptoms. Electronically Signed   By: Rise Mu M.D.   On: 10/10/2022 23:38      Subjective: Patient seen and examined at bedside.  No worsening shortness of breath, fever or vomiting.  Left eye pain is slightly improving. Discharge Exam: Vitals:   10/12/22 0757 10/12/22 1156  BP: 113/73 114/70  Pulse: 73 93  Resp: 17 19  Temp: 98.3 F (36.8 C) 98.9 F (37.2 C)  SpO2: 94% 95%    General: No acute distress.  Still on room air.   Respiratory: Bilateral decreased breath sounds at bases with no wheezing  CVS: S1-S2 heard; rate controlled currently abdominal: Soft, morbidly obese, nontender, mildly distended; no organomegaly; bowel sounds normally heard extremities: No cyanosis; mild lower extremity edema present    The results of significant diagnostics from this hospitalization (including imaging, microbiology, ancillary and laboratory) are listed below for reference.     Microbiology: No results found for this or any previous visit (from the past 240 hour(s)).   Labs: BNP (last 3 results) No results for input(s): "BNP" in the last 8760 hours. Basic Metabolic Panel: Recent Labs  Lab 10/10/22 1640 10/11/22 0435  NA 136 132*  K 3.3* 4.0  CL 101 97*  CO2 25 22  GLUCOSE 99 223*  BUN 12 12  CREATININE 0.94 0.92  CALCIUM 9.5 9.4  MG  --  1.9   Liver Function Tests: Recent Labs  Lab 10/11/22 0435  AST 24  ALT 21  ALKPHOS 96  BILITOT 0.2*  PROT 7.1  ALBUMIN 3.8   No results for input(s): "LIPASE", "AMYLASE" in the last 168 hours. No results for input(s): "AMMONIA" in the last 168 hours. CBC: Recent Labs  Lab 10/10/22 1640 10/11/22 0435  WBC 12.8* 8.6  NEUTROABS 7.9*  --   HGB 13.5 13.5  HCT 40.2 40.4  MCV 85.4 84.3  PLT 425* 424*   Cardiac  Enzymes: No results for input(s): "CKTOTAL", "CKMB", "CKMBINDEX", "TROPONINI" in the last 168 hours. BNP: Invalid input(s): "POCBNP" CBG: No results for input(s): "GLUCAP" in the last 168 hours. D-Dimer No results for input(s): "DDIMER" in the last 72 hours. Hgb A1c No results for input(s): "HGBA1C" in the last 72 hours. Lipid Profile No results for input(s): "CHOL", "HDL", "LDLCALC", "TRIG", "CHOLHDL", "LDLDIRECT" in the last 72 hours. Thyroid function studies No results for input(s): "TSH", "T4TOTAL", "T3FREE", "THYROIDAB" in the last 72 hours.  Invalid input(s): "FREET3" Anemia work up No results for input(s): "VITAMINB12", "FOLATE", "FERRITIN", "TIBC", "IRON", "RETICCTPCT" in the last 72 hours. Urinalysis    Component Value Date/Time   COLORURINE STRAW (A) 10/11/2022 0730   APPEARANCEUR CLEAR 10/11/2022 0730   LABSPEC 1.010 10/11/2022 0730   PHURINE 5.0 10/11/2022 0730   GLUCOSEU 50 (A) 10/11/2022 0730   HGBUR NEGATIVE 10/11/2022 0730   BILIRUBINUR NEGATIVE 10/11/2022 0730   KETONESUR NEGATIVE 10/11/2022 0730   PROTEINUR NEGATIVE 10/11/2022 0730   UROBILINOGEN 1.0 04/20/2013 0654   NITRITE NEGATIVE 10/11/2022 0730   LEUKOCYTESUR NEGATIVE 10/11/2022 0730   Sepsis Labs Recent Labs  Lab 10/10/22 1640 10/11/22 0435  WBC 12.8* 8.6  Microbiology No results found for this or any previous visit (from the past 240 hour(s)).   Time coordinating discharge: 35 minutes  SIGNED:   Glade Lloyd, MD  [facility] 10/12/2022, 11:58 AM

## 2022-10-12 NOTE — Progress Notes (Signed)
He is acute PROGRESS NOTE    Stacy Deleon  GNF:[id number] DOB: Jan 18, 1981 DOA: 10/10/2022 PCP: Ladora Daniel, PA-C   Brief Narrative:   42 y.o. female with medical history significant of PCOS, optic neuritis of unknown etiology followed closely by ophthalmology, multiple family members with multiple sclerosis was seen at her local ophthalmology office by retina specialist/Dr. Groat's office for increasing left eye pain with decreased vision along with some blurring of vision and occasional double vision for the last several days.   ED provider spoke to on-call ophthalmologist who recommended high-dose steroids, neurology evaluation and admission.  Neurology recommended MRI brain and cervical spine.  Assessment & Plan:   Left eye pain with concerns for optic neuritis/multiple sclerosis flare -Presented with few days of worsening left eye pain along with intermittent tingling and discomfort in her left arm and leg. -ED provider spoke to on-call ophthalmologist who recommended high-dose steroids, neurology evaluation and admission along with outpatient follow-up with ophthalmology in 6 weeks.   -Currently on Solu-Medrol 500 mg IV twice a day.  MRI of brain and cervical spine negative for any acute abnormality -Neurology following and had recommended LP.  IR could not perform LP on 10/11/2022 because she could not lay still and subsequently refused the procedure -Fall precautions.  PT eval.   Leukocytosis -Reactive.  Resolved.  Hypokalemia -Improved  Hyponatremia -No labs today.  Thrombocytosis -Possibly reactive.  Monitor intermittently.   PCOS -continue metformin   Depression -continue lexapro   Morbid obesity -outpatient follow-up     DVT prophylaxis: SCDs Code Status: Full Family Communication: None at bedside Disposition Plan: Status is: Inpatient Remains inpatient appropriate because: Of severity of illness  Consultants: Neurology  Procedures: None  Antimicrobials:  None   Subjective: Patient seen and examined at bedside.  No worsening shortness of breath, fever or vomiting.  Still complaining of left eye pain. Objective: Vitals:   10/11/22 1939 10/11/22 2336 10/12/22 0422 10/12/22 0757  BP: (!) 91/53 (!) 99/54 111/67 113/73  Pulse: 66 65 64 73  Resp: 16 16 14 17   Temp: 98.5 F (36.9 C) 98 F (36.7 C) 98 F (36.7 C) 98.3 F (36.8 C)  TempSrc: Oral Oral Oral Oral  SpO2: 92% 96% 94% 94%  Weight:      Height:       No intake or output data in the 24 hours ending 10/12/22 0815  Filed Weights   10/10/22 1605  Weight: 114 kg    Examination:  General: No acute distress.  Still on room air.   Respiratory: Bilateral decreased breath sounds at bases with no wheezing  CVS: S1-S2 heard; rate controlled currently abdominal: Soft, morbidly obese, nontender, mildly distended; no organomegaly; bowel sounds normally heard extremities: No cyanosis; mild lower extremity edema present     Data Reviewed: I have personally reviewed following labs and imaging studies  CBC: Recent Labs  Lab 10/10/22 1640 10/11/22 0435  WBC 12.8* 8.6  NEUTROABS 7.9*  --   HGB 13.5 13.5  HCT 40.2 40.4  MCV 85.4 84.3  PLT 425* 424*    Basic Metabolic Panel: Recent Labs  Lab 10/10/22 1640 10/11/22 0435  NA 136 132*  K 3.3* 4.0  CL 101 97*  CO2 25 22  GLUCOSE 99 223*  BUN 12 12  CREATININE 0.94 0.92  CALCIUM 9.5 9.4  MG  --  1.9    GFR: Estimated Creatinine Clearance: 102.4 mL/min (by C-G formula based on SCr of 0.92 mg/dL). Liver Function Tests:  Recent Labs  Lab 10/11/22 0435  AST 24  ALT 21  ALKPHOS 96  BILITOT 0.2*  PROT 7.1  ALBUMIN 3.8    No results for input(s): "LIPASE", "AMYLASE" in the last 168 hours. No results for input(s): "AMMONIA" in the last 168 hours. Coagulation Profile: Recent Labs  Lab 10/11/22 1029  INR 1.0   Cardiac Enzymes: No results for input(s): "CKTOTAL", "CKMB", "CKMBINDEX", "TROPONINI" in the last 168  hours. BNP (last 3 results) No results for input(s): "PROBNP" in the last 8760 hours. HbA1C: No results for input(s): "HGBA1C" in the last 72 hours. CBG: No results for input(s): "GLUCAP" in the last 168 hours. Lipid Profile: No results for input(s): "CHOL", "HDL", "LDLCALC", "TRIG", "CHOLHDL", "LDLDIRECT" in the last 72 hours. Thyroid Function Tests: No results for input(s): "TSH", "T4TOTAL", "FREET4", "T3FREE", "THYROIDAB" in the last 72 hours. Anemia Panel: No results for input(s): "VITAMINB12", "FOLATE", "FERRITIN", "TIBC", "IRON", "RETICCTPCT" in the last 72 hours. Sepsis Labs: No results for input(s): "PROCALCITON", "LATICACIDVEN" in the last 168 hours.  No results found for this or any previous visit (from the past 240 hour(s)).       Radiology Studies: MR CERVICAL SPINE W WO CONTRAST  Result Date: 10/10/2022 CLINICAL DATA:  Initial evaluation for cervical radiculopathy. EXAM: MRI CERVICAL SPINE WITHOUT AND WITH CONTRAST TECHNIQUE: Multiplanar and multiecho pulse sequences of the cervical spine, to include the craniocervical junction and cervicothoracic junction, were obtained without and with intravenous contrast. CONTRAST:  10mL GADAVIST GADOBUTROL 1 MMOL/ML IV SOLN COMPARISON:  Prior radiograph from 03/17/2015. FINDINGS: Alignment: Straightening of the normal cervical lordosis. No listhesis. Vertebrae: Vertebral body height maintained without acute or chronic fracture. Diffuse loss of normal bone marrow signal, nonspecific but can be seen with anemia, smoking, obesity, and infiltrative/myelofibrotic marrow processes. No focal marrow replacing lesion. No abnormal marrow edema or enhancement. Cord: Normal signal and morphology.  No abnormal enhancement. Posterior Fossa, vertebral arteries, paraspinal tissues: Unremarkable. Disc levels: No significant disc pathology seen within the cervical spine. No disc bulge or focal disc herniation. No significant facet disease. No canal or neural  foraminal stenosis or evidence for neural impingement. IMPRESSION: 1. Normal MRI appearance of the cervical spinal cord. No evidence for demyelinating disease. 2. Diffuse loss of normal bone marrow signal, nonspecific but can be seen with anemia, smoking, obesity, and infiltrative/myelofibrotic marrow processes. 3. Otherwise unremarkable and normal MRI of the cervical spine. No significant disc pathology, stenosis, or evidence for neural impingement. Electronically Signed   By: Rise Mu M.D.   On: 10/10/2022 23:58   MR Brain W and Wo Contrast  Result Date: 10/10/2022 CLINICAL DATA:  Initial evaluation for numbness, paresthesias. EXAM: MRI HEAD WITHOUT AND WITH CONTRAST TECHNIQUE: Multiplanar, multiecho pulse sequences of the brain and surrounding structures were obtained without and with intravenous contrast. CONTRAST:  10mL GADAVIST GADOBUTROL 1 MMOL/ML IV SOLN COMPARISON:  None Available. FINDINGS: Brain: Cerebral volume within normal limits for age. No focal parenchymal signal abnormality. No abnormal foci of restricted diffusion to suggest acute or subacute ischemia. Gray-white matter differentiation well maintained. No encephalomalacia to suggest chronic cortical infarction or other insult. No foci of susceptibility artifact indicative of acute or chronic intracranial blood products. No mass lesion, midline shift or mass effect. Ventricles normal in size and morphology without hydrocephalus. No extra-axial fluid collection. Pituitary gland and suprasellar region within normal limits. No abnormal enhancement. Vascular: Major intracranial vascular flow voids are well maintained. Skull and upper cervical spine: Craniocervical junction within normal limits. Visualized  upper cervical spine demonstrates no significant finding. Diffuse loss of normal bone marrow signal, nonspecific but can be seen with anemia, smoking, obesity, and infiltrative/myelofibrotic marrow processes. No scalp soft tissue  abnormality. Sinuses/Orbits: Globes and orbital soft tissues are within normal limits. Paranasal sinuses are largely clear. No significant mastoid effusion. Other: None. IMPRESSION: Normal brain MRI. No acute intracranial abnormality or findings to explain patient's symptoms. Electronically Signed   By: Rise Mu M.D.   On: 10/10/2022 23:38        Scheduled Meds:  ketorolac  15 mg Intravenous Q6H   And   diphenhydrAMINE  12.5 mg Intravenous Q6H   And   prochlorperazine  5 mg Intravenous Q6H   escitalopram  20 mg Oral Daily   metFORMIN  1,000 mg Oral Q supper   senna-docusate  1 tablet Oral BID   Continuous Infusions:  methylPREDNISolone (SOLU-MEDROL) injection 500 mg (10/12/22 0600)          Glade Lloyd, MD [facility] 10/12/2022, 8:15 AM

## 2022-10-12 NOTE — Progress Notes (Addendum)
Neurology Progress Note   S:// Seen and examined.  Completed 3 days of 500 mg of Solu-Medrol because that was the dose that was used when she last had her optic neuritis and in 2022. Bedside LP failed Appreciate radiology for attempting a fluoroscopy guided IR which she also was not able to tolerate and the procedure had to be aborted without needle being placed for fluid return. Her tingling numbness has been going on for few years and is intermittent.  Does not have any report of Lhermitte's phenomenon  O:// Current vital signs: BP 113/73 (BP Location: Left Arm)   Pulse 73   Temp 98.3 F (36.8 C) (Oral)   Resp 17   Ht 5' 5"$  (1.651 m)   Wt 114 kg   LMP 04/18/2017   SpO2 94%   BMI 41.82 kg/m  Vital signs in last 24 hours: Temp:  [98 F (36.7 C)-98.6 F (37 C)] 98.3 F (36.8 C) (02/10 0757) Pulse Rate:  [64-73] 73 (02/10 0757) Resp:  [14-17] 17 (02/10 0757) BP: (91-113)/(50-73) 113/73 (02/10 0757) SpO2:  [92 %-96 %] 94 % (02/10 0757) General: Awake alert in no distress HEENT: Normocephalic atraumatic Lungs: Clear Cardiovascular: Regular rhythm Neurological exam Awake alert oriented x 3.  No dysarthria, no aphasia. Cranial nerve examination: Pupils equal round reactive light, no evidence of APD, continues to complain of diminished visual acuity in the left eye.  Extraocular movements intact, visual fields full, face appears symmetric, tongue and palate midline. Motor and sensory exam unremarkable Coordination exam with no deficits.  Medications  Current Facility-Administered Medications:    acetaminophen (TYLENOL) tablet 650 mg, 650 mg, Oral, Q6H PRN **OR** acetaminophen (TYLENOL) suppository 650 mg, 650 mg, Rectal, Q6H PRN, Starla Link, Kshitiz, MD   diphenhydrAMINE (BENADRYL) capsule 50 mg, 50 mg, Oral, Q6H PRN, Starla Link, Kshitiz, MD, 50 mg at 10/10/22 2359   ketorolac (TORADOL) 15 MG/ML injection 15 mg, 15 mg, Intravenous, Q6H, 15 mg at 10/12/22 1103 **AND** diphenhydrAMINE  (BENADRYL) injection 12.5 mg, 12.5 mg, Intravenous, Q6H, 12.5 mg at 10/12/22 1103 **AND** prochlorperazine (COMPAZINE) injection 5 mg, 5 mg, Intravenous, Q6H, Bhagat, Srishti L, MD, 5 mg at 10/12/22 1103   escitalopram (LEXAPRO) tablet 20 mg, 20 mg, Oral, Daily, Alekh, Kshitiz, MD, 20 mg at 10/12/22 0905   [START ON 10/14/2022] ibuprofen (ADVIL) tablet 600 mg, 600 mg, Oral, Q8H PRN, Starla Link, Kshitiz, MD   metFORMIN (GLUCOPHAGE) tablet 1,000 mg, 1,000 mg, Oral, Q supper, Starla Link, Kshitiz, MD, 1,000 mg at 10/11/22 1807   methylPREDNISolone sodium succinate (SOLU-MEDROL) 500 mg in sodium chloride 0.9 % 50 mL IVPB, 500 mg, Intravenous, Q12H, Alekh, Kshitiz, MD, Last Rate: 116 mL/hr at 10/12/22 0600, 500 mg at 10/12/22 0600   ondansetron (ZOFRAN) tablet 4 mg, 4 mg, Oral, Q6H PRN **OR** ondansetron (ZOFRAN) injection 4 mg, 4 mg, Intravenous, Q6H PRN, Alekh, Kshitiz, MD   oxyCODONE-acetaminophen (PERCOCET/ROXICET) 5-325 MG per tablet 1 tablet, 1 tablet, Oral, Q4H PRN, Starla Link, Kshitiz, MD, 1 tablet at 10/11/22 1549   polyethylene glycol (MIRALAX / GLYCOLAX) packet 17 g, 17 g, Oral, Daily PRN, Starla Link, Kshitiz, MD, 17 g at 10/11/22 1820   senna-docusate (Senokot-S) tablet 1 tablet, 1 tablet, Oral, BID, Starla Link, Kshitiz, MD, 1 tablet at 10/12/22 0905 Labs CBC    Component Value Date/Time   WBC 8.6 10/11/2022 0435   RBC 4.79 10/11/2022 0435   HGB 13.5 10/11/2022 0435   HCT 40.4 10/11/2022 0435   PLT 424 (H) 10/11/2022 0435   MCV 84.3 10/11/2022 0435  MCH 28.2 10/11/2022 0435   MCHC 33.4 10/11/2022 0435   RDW 12.2 10/11/2022 0435   LYMPHSABS 3.7 10/10/2022 1640   MONOABS 1.0 10/10/2022 1640   EOSABS 0.1 10/10/2022 1640   BASOSABS 0.1 10/10/2022 1640    CMP     Component Value Date/Time   NA 132 (L) 10/11/2022 0435   K 4.0 10/11/2022 0435   CL 97 (L) 10/11/2022 0435   CO2 22 10/11/2022 0435   GLUCOSE 223 (H) 10/11/2022 0435   BUN 12 10/11/2022 0435   CREATININE 0.92 10/11/2022 0435   CALCIUM 9.4  10/11/2022 0435   PROT 7.1 10/11/2022 0435   ALBUMIN 3.8 10/11/2022 0435   AST 24 10/11/2022 0435   ALT 21 10/11/2022 0435   ALKPHOS 96 10/11/2022 0435   BILITOT 0.2 (L) 10/11/2022 0435   GFRNONAA >60 10/11/2022 0435   GFRAA >60 01/29/2017 1816   Imaging I have personally reviewed images  Brain and C-spine MRI with and without contrast normal  Assessment: Patient with regarding left-sided headache and vision changes, with some positional variation concerning for high pressure headache, less likely based on her description and neurological cause but might be something ophthalmic. She has no APD.  Brain and C-spine MRI are unremarkable 50 18 process-demyelinating less likely IHT likely given some positional variation and body habitus-LP bedside and fluoroscopically failed/incomplete due to patient tolerance. She appears very comfortable.  Still has decreased acuity in the left eye. Headaches have subsided Has been told that she had optic neuritis in 2022, treated with Solu-Medrol 500 mg for 3 days with return of vision in about a week and has similar symptoms. I am unable to establish a clear cause and without my ability to perform an LP, I am limited in my evaluation here.   Impression Not likely optic neuritis Question ocular migraine versus a primary ophthalmic cause for blurred vision  Recommendations: She has completed 3 days of 500 mg of IV steroids She can be discharged home with outpatient ophthalmologic and neurologic follow-up.  She is scheduled to see Dr. Rachel Moulds in [place] have asked her to keep that appointment. If her symptoms worsen, she has been instructed to come back to the ER for further evaluation. She would benefit from a spinal tap may be with sedation but I would not like to recommend LP under general anesthesia because it will confound the pressure readings and would not be very beneficial for diagnosis of IIH T Plan was discussed with Dr. Starla Link and the  patient.  -- Amie Portland, MD Neurologist [facility] Pager: [phone]

## 2022-10-30 ENCOUNTER — Encounter (HOSPITAL_BASED_OUTPATIENT_CLINIC_OR_DEPARTMENT_OTHER): Payer: Self-pay | Admitting: Emergency Medicine

## 2022-10-30 ENCOUNTER — Emergency Department (HOSPITAL_BASED_OUTPATIENT_CLINIC_OR_DEPARTMENT_OTHER)
Admission: EM | Admit: 2022-10-30 | Discharge: 2022-10-30 | Disposition: A | Discharge status: Home / Self Care | Payer: Medicaid Other | Attending: Emergency Medicine | Admitting: Emergency Medicine

## 2022-10-30 DIAGNOSIS — D72829 Elevated white blood cell count, unspecified: Secondary | ICD-10-CM | POA: Insufficient documentation

## 2022-10-30 DIAGNOSIS — R519 Headache, unspecified: Secondary | ICD-10-CM

## 2022-10-30 LAB — CBC WITH DIFFERENTIAL/PLATELET
Abs Immature Granulocytes: 0.25 10*3/uL — ABNORMAL HIGH (ref 0.00–0.07)
Basophils Absolute: 0.1 10*3/uL (ref 0.0–0.1)
Basophils Relative: 1 %
Eosinophils Absolute: 0.2 10*3/uL (ref 0.0–0.5)
Eosinophils Relative: 2 %
HCT: 40.9 % (ref 36.0–46.0)
Hemoglobin: 13.2 g/dL (ref 12.0–15.0)
Immature Granulocytes: 2 %
Lymphocytes Relative: 27 %
Lymphs Abs: 3.7 10*3/uL (ref 0.7–4.0)
MCH: 27.6 pg (ref 26.0–34.0)
MCHC: 32.3 g/dL (ref 30.0–36.0)
MCV: 85.6 fL (ref 80.0–100.0)
Monocytes Absolute: 1.6 10*3/uL — ABNORMAL HIGH (ref 0.1–1.0)
Monocytes Relative: 12 %
Neutro Abs: 8 10*3/uL — ABNORMAL HIGH (ref 1.7–7.7)
Neutrophils Relative %: 56 %
Platelets: 432 10*3/uL — ABNORMAL HIGH (ref 150–400)
RBC: 4.78 MIL/uL (ref 3.87–5.11)
RDW: 13.1 % (ref 11.5–15.5)
WBC: 13.8 10*3/uL — ABNORMAL HIGH (ref 4.0–10.5)
nRBC: 0 % (ref 0.0–0.2)

## 2022-10-30 LAB — BASIC METABOLIC PANEL
Anion gap: 6 (ref 5–15)
BUN: 18 mg/dL (ref 6–20)
CO2: 26 mmol/L (ref 22–32)
Calcium: 8.9 mg/dL (ref 8.9–10.3)
Chloride: 102 mmol/L (ref 98–111)
Creatinine, Ser: 0.84 mg/dL (ref 0.44–1.00)
GFR, Estimated: >60 mL/min (ref >60)
Glucose, Bld: 95 mg/dL (ref 70–99)
Potassium: 4.3 mmol/L (ref 3.5–5.1)
Sodium: 134 mmol/L — ABNORMAL LOW (ref 135–145)

## 2022-10-30 MED ORDER — PROCHLORPERAZINE EDISYLATE 10 MG/2ML IJ SOLN: 10.0000 mg | Freq: Once | INTRAMUSCULAR | Status: DC

## 2022-10-30 MED ORDER — PROCHLORPERAZINE EDISYLATE 10 MG/2ML IJ SOLN
10.0000 mg | Freq: Once | INTRAMUSCULAR | Status: AC
  Administered 2022-10-30: 10 mg via INTRAVENOUS
  Filled 2022-10-30: qty 2

## 2022-10-30 MED ORDER — DIPHENHYDRAMINE HCL 50 MG/ML IJ SOLN
25.0000 mg | Freq: Once | INTRAMUSCULAR | Status: AC
  Administered 2022-10-30: 25 mg via INTRAVENOUS
  Filled 2022-10-30: qty 1

## 2022-10-30 MED ORDER — KETOROLAC TROMETHAMINE 15 MG/ML IJ SOLN
15.0000 mg | Freq: Once | INTRAMUSCULAR | Status: AC
  Administered 2022-10-30: 15 mg via INTRAVENOUS
  Filled 2022-10-30: qty 1

## 2022-10-30 MED ORDER — KETOROLAC TROMETHAMINE 15 MG/ML IJ SOLN: 15.0000 mg | Freq: Once | INTRAMUSCULAR | Status: DC

## 2022-10-30 MED ORDER — METHYLPREDNISOLONE SODIUM SUCC 125 MG IJ SOLR
125.0000 mg | Freq: Once | INTRAMUSCULAR | Status: AC
  Administered 2022-10-30: 125 mg via INTRAVENOUS
  Filled 2022-10-30: qty 2

## 2022-10-30 MED ORDER — MORPHINE SULFATE (PF) 10 MG/ML IV SOLN: 0.1000 mg/kg | Freq: Once | INTRAVENOUS | Status: DC

## 2022-10-30 MED ORDER — SODIUM CHLORIDE 0.9 % IV SOLN: INTRAVENOUS | Status: DC

## 2022-10-30 MED ORDER — MORPHINE SULFATE (PF) 4 MG/ML IV SOLN: 6.0000 mg | Freq: Once | INTRAVENOUS | Status: DC

## 2022-10-30 MED ORDER — SODIUM CHLORIDE 0.9 % IV BOLUS
1000.0000 mL | Freq: Once | INTRAVENOUS | Status: AC
  Administered 2022-10-30: 1000 mL via INTRAVENOUS

## 2022-10-30 NOTE — ED Triage Notes (Signed)
Pt with migraine since last night; migraine meds not helping; recently dx with cluster migraines behind LT eye

## 2022-10-30 NOTE — ED Provider Notes (Signed)
[place] [facility] Provider Note   CSN: [id number] Arrival date & time: 10/30/22  1504     History  Chief Complaint  Patient presents with   Migraine   HPI Stacy Deleon is a 42 y.o. female with optic neuritis, recent history of cluster headaches presenting for migraine.  Headache started last night.  It is located around and behind the left eye.  The pain is dull and constant.  She is endorsing visual disturbance in the left eye.  He took oxycodone Benadryl and verapamil and ibuprofen last night and at 7 AM this morning which did improve her symptoms somewhat.  Pain progressively worse today prompting her to be evaluated in the ED.  Was recently admitted to the hospital for a headache with the same symptoms per patient.  Diagnosed at that time with a cluster headache.  MRI of the brain and cervical spine were negative on February 8.  Discharged home on February 10 with follow-up with neurology and ophthalmology.  Was started on verapamil for cluster headaches at that time.  States her headache today feels very similar to her headache she had back on the eighth of this month.  Denies dizziness.   Migraine Associated symptoms include headaches.       Home Medications Prior to Admission medications   Medication Sig Start Date End Date Taking? Authorizing Provider  escitalopram (LEXAPRO) 20 MG tablet Take 20 mg by mouth every morning. 04/30/19   [provider]  ibuprofen (ADVIL) 200 MG tablet Take 400 mg by mouth every 8 (eight) hours as needed for mild pain or headache.    [provider]  metFORMIN (GLUCOPHAGE-XR) 500 MG 24 hr tablet Take 1,000 mg by mouth every evening.    [provider]  sertraline (ZOLOFT) 100 MG tablet Take 100 mg by mouth daily.    11/11/11  [provider]      Allergies    Codeine, Oxycodone, Other, and Metoclopramide    Review of Systems   Review of Systems  Neurological:   Positive for headaches.    Physical Exam   Vitals:   10/30/22 1509  BP: 106/76  Pulse: (!) 113  Resp: 18  Temp: 99.1 F (37.3 C)  SpO2: 98%    CONSTITUTIONAL:  well-appearing, NAD NEURO:  GCS 15. Speech is goal oriented. No deficits appreciated to CN III-XII; symmetric eyebrow raise, no facial drooping, tongue midline. Patient has equal grip strength bilaterally with 5/5 strength against resistance in all major muscle groups bilaterally. Sensation to light touch intact. Patient moves extremities without ataxia. Normal finger-nose-finger. Patient ambulatory with steady gait. EYES:  eyes equal and reactive ENT/NECK:  Supple, no stridor  CARDIO:  regular rate and rhythm, appears well-perfused  PULM:  No respiratory distress, CTAB GI/GU:  non-distended, soft, non tender MSK/SPINE:  No gross deformities, no edema, moves all extremities  SKIN:  no rash, atraumatic   *Additional and/or pertinent findings included in MDM below    ED Results / Procedures / Treatments   Labs (all labs ordered are listed, but only abnormal results are displayed) Labs Reviewed  BASIC METABOLIC PANEL - Abnormal; Notable for the following components:      Result Value   Sodium 134 (*)    All other components within normal limits  CBC WITH DIFFERENTIAL/PLATELET - Abnormal; Notable for the following components:   WBC 13.8 (*)    Platelets 432 (*)    Neutro Abs 8.0 (*)  Monocytes Absolute 1.6 (*)    Abs Immature Granulocytes 0.25 (*)    All other components within normal limits    EKG None  Radiology No results found.  Procedures Procedures    Medications Ordered in ED Medications  sodium chloride 0.9 % bolus 1,000 mL (0 mLs Intravenous Stopped 10/30/22 1831)  ketorolac (TORADOL) 15 MG/ML injection 15 mg (15 mg Intravenous Given 10/30/22 1733)  prochlorperazine (COMPAZINE) injection 10 mg (10 mg Intravenous Given 10/30/22 1733)  methylPREDNISolone sodium succinate (SOLU-MEDROL) 125 mg/2 mL  injection 125 mg (125 mg Intravenous Given 10/30/22 1733)  diphenhydrAMINE (BENADRYL) injection 25 mg (25 mg Intravenous Given 10/30/22 1750)    ED Course/ Medical Decision Making/ A&P                             Medical Decision Making Amount and/or Complexity of Data Reviewed Labs: ordered.  Risk Prescription drug management.   Initial Impression and Ddx 42 yo Female who is well-appearing and hemodynamically stable presenting for headache.  Exam is unremarkable.  DDx includes ICH, migraine, cluster headache, hypertensive emergency and electrolyte derangement. Patient PMH that increases complexity of ED encounter: History of optic neuritis and recent history of cluster headache  Interpretation of Diagnostics I independent reviewed and interpreted the labs as followed: Leukocytosis    Patient Reassessment and Ultimate Disposition/Management Given the character of her headache and and recent headache that was similar, symptoms are consistent with a likely cluster headache.  Reviewed her chart and recent admission and patient was at that time treated with IV Solu-Medrol twice daily, verapamil.  Treat her headache with Benadryl, Toradol Compazine and Solu-Medrol.  Volume resuscitated with normal saline.  Assessment patient stated that she felt much better and had a strong desire to discharge and follow-up with her neurologist.  Patient states she does have an appointment on Monday.  Given how clinically well she appeared without any focal neurodeficit on exam had low suspicion for ICH.  Discharged stable vitals and advised her to follow-up with her neurologist.  Patient management required discussion with the following services or consulting groups:  None  Complexity of Problems Addressed Acute complicated illness or Injury  Additional Data Reviewed and Analyzed Further history obtained from: Past medical history and medications listed in the EMR and Recent discharge summary  Patient  Encounter Risk Assessment None         Final Clinical Impression(s) / ED Diagnoses Final diagnoses:  Acute nonintractable headache, unspecified headache type    Rx / DC Orders ED Discharge Orders     None         Harriet Pho, PA-C 10/31/22 1428    Drenda Freeze, MD 11/01/22 [phone]

## 2022-10-30 NOTE — ED Notes (Signed)
Pt became very anxious after receiving previous medications and is refusing morphine. She agrees to receive benadryl hoping it will help her relax. RN attempted ot connect pt to monitor for vitals but she refused at this time. Notified EDP of pt symptoms and VS refusal. Will continue to monitor closely until symptoms resolve.

## 2022-11-04 ENCOUNTER — Ambulatory Visit (INDEPENDENT_AMBULATORY_CARE_PROVIDER_SITE_OTHER): Payer: Medicaid Other | Admitting: Neurology

## 2022-11-04 ENCOUNTER — Encounter: Payer: Self-pay | Admitting: Neurology

## 2022-11-04 VITALS — BP 117/76 | HR 95 | Ht 65.0 in | Wt 258.0 lb

## 2022-11-04 DIAGNOSIS — H469 Unspecified optic neuritis: Secondary | ICD-10-CM

## 2022-11-04 DIAGNOSIS — G44029 Chronic cluster headache, not intractable: Secondary | ICD-10-CM

## 2022-11-04 MED ORDER — EMGALITY (300 MG DOSE) 100 MG/ML ~~LOC~~ SOSY: PREFILLED_SYRINGE | SUBCUTANEOUS | 5 refills | Status: AC

## 2022-11-04 NOTE — Progress Notes (Signed)
[facility]  PATIENT: Stacy Deleon DOB: [birth year]  REFERRING DOCTOR OR PCP: Ladora Daniel, PA-C SOURCE: Patient, emergency room notes, imaging and lab reports, MRI images personally reviewed  _________________________________   HISTORICAL  CHIEF COMPLAINT:  Chief Complaint  Patient presents with   Room 10    Pt is here with her Mother. Pt states she has cluster headaches. Pt states that her headaches are continuous an don't have a end date or time. Pt states that she has blurry vision. Pt states that she sees lighting bolts and black dots in her left eye. Pt states that she has tremors as well since November 2022 along with her episode of Optic Neuritis. Pt states that she has light sensitivity.       HISTORY OF PRESENT ILLNESS:  I had the pleasure seeing your patient, Stacy Deleon, [facility] for neurologic consultation regarding her headaches and history of optic neuritis.  She is a 42 year old woman who had the onset of visual change noted upon awakening one day in November 2022.  This only involved the left eye.    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  She saw [place] retinal specialist and she was referred to the ED  She was admitted and had 5 days of high dose IV steroids.  LP 07/06/2021 was normal (no OCB).   MRI brain was normal.    Improvement was dramatic and she returned to baseline after a few weeks.     She had 3 more small episodes of milder visual change with improvement each time.      She had another bad episode early February 2024.     She had blurry vision, black floaters and extreme pain, worse with eye movements.  She was admitted and given IV steroids.  VA has improved but not to baseline.   She continues to have floaters, lightning bolt and pain.    She was readmitted to [facility] and told she had cluster migraines.      Two days ago, she had the onset of pain in the back of the right eye.  This pain was nly 3/10 vs  10/10 in February.    She did not note visual changes OD.    The headache quality is stabbing and in or around the eye.  It is severe in intensity.  Her current headache is 7/10 but she is getting intermittent more severe pain.  She has tremors in her hands and muscle spasms in her legs.   She has tingling in her hands, fingers and feet.    Gait, strength and balance are doing well.   She has some urinary urgency wih rare urge incontinence since mid 2023.       She was started on verapamil 40 mg bid, Robaxin, hydroxyzine and oxycodone.  She does not note any improvement on the verapamil.   Imaging reviewed: MRI of the orbits 10/21/2022 was normal.  MRI of the brain 10/10/2022 was normal.  MRI of the cervical spine 10/10/2022 showed a normal spinal cord and no significant degenerative change.  Reduce fat signaling bone marrow.   Labs: 10/21/2022 Anti-MOg, anti-NMO were negative.  Bartonella, Lyme, ceruloplasmin, copperENA  CSF 07/06/2021 did not show any oligoclonal bands and was noncontributory  REVIEW OF SYSTEMS: Constitutional: No fevers, chills, sweats, or change in appetite Eyes: No visual changes, double vision, eye pain Ear, nose and throat: No hearing loss, ear pain, nasal congestion, sore throat Cardiovascular: No chest pain, palpitations  Respiratory:  No shortness of breath at rest or with exertion.   No wheezes GastrointestinaI: No nausea, vomiting, diarrhea, abdominal pain, fecal incontinence Genitourinary:  No dysuria, urinary retention or frequency.  No nocturia. Musculoskeletal:  No neck pain, back pain Integumentary: No rash, pruritus, skin lesions Neurological: as above Psychiatric: No depression at this time.  No anxiety Endocrine: No palpitations, diaphoresis, change in appetite, change in weigh or increased thirst Hematologic/Lymphatic:  No anemia, purpura, petechiae. Allergic/Immunologic: No itchy/runny eyes, nasal congestion, recent allergic reactions,  rashes  ALLERGIES: Allergies  Allergen Reactions   Codeine Itching   Oxycodone Itching and Other (See Comments)    Takes Benadryl with Percocet to help with itching   Other Anxiety and Other (See Comments)    The patient asked if she is given anything iv push, for it to be done slowly because if it's too fast she has terrible anxiety   Metoclopramide Anxiety    HOME MEDICATIONS:  Current Outpatient Medications:    escitalopram (LEXAPRO) 20 MG tablet, Take 20 mg by mouth every morning., Disp: , Rfl:    hydrOXYzine (ATARAX) 25 MG tablet, Take 25 mg by mouth 3 (three) times daily as needed., Disp: , Rfl:    ibuprofen (ADVIL) 200 MG tablet, Take 400 mg by mouth every 8 (eight) hours as needed for mild pain or headache., Disp: , Rfl:    metFORMIN (GLUCOPHAGE-XR) 500 MG 24 hr tablet, Take 1,000 mg by mouth every evening., Disp: , Rfl:    methocarbamol (ROBAXIN) 500 MG tablet, Take 500 mg by mouth 4 (four) times daily., Disp: , Rfl:    oxyCODONE (OXY IR/ROXICODONE) 5 MG immediate release tablet, Take 5 mg by mouth every 4 (four) hours as needed for severe pain., Disp: , Rfl:    verapamil (CALAN) 40 MG tablet, Take 40 mg by mouth 3 (three) times daily., Disp: , Rfl:   PAST MEDICAL HISTORY: Past Medical History:  Diagnosis Date   ADHD (attention deficit hyperactivity disorder)    Anxiety    Bronchitis    Depression    GERD (gastroesophageal reflux disease)    Headache    Incontinence of urine    Neuromuscular disorder (HCC)    MIGRAINES    Optic neuritis    left eye- trouble with vision and eye pain   Optic neuritis 10/2021   Polycystic ovary syndrome    PONV (postoperative nausea and vomiting)    "I wake up really hot"    PAST SURGICAL HISTORY: Past Surgical History:  Procedure Laterality Date   ABDOMINAL EXPOSURE N/A 03/13/2016   Procedure: ABDOMINAL EXPOSURE;  Surgeon: Larina Earthly, MD;  Location: [facility] OR;  Service: Vascular;  Laterality: N/A;   ABDOMINAL HYSTERECTOMY      ANTERIOR LUMBAR FUSION N/A 03/13/2016   Procedure: ANTERIOR LUMBAR FUSION L4-L5    (1 LEVEL)  ALIF;  Surgeon: Venita Lick, MD;  Location: [facility] OR;  Service: Orthopedics;  Laterality: N/A;   HERNIA REPAIR     LUMBAR LAMINECTOMY/DECOMPRESSION MICRODISCECTOMY Left 10/13/2014   Procedure: LEFT L4-L5 DISCECTOMY    (1 LEVEL);  Surgeon: Venita Lick, MD;  Location: [facility] OR;  Service: Orthopedics;  Laterality: Left;   ORIF ANKLE FRACTURE Left 03/26/2022   Procedure: OPEN TREATMENT OF LEFT LATERAL MALLEOLUS;  Surgeon: Terance Hart, MD;  Location: [facility] OR;  Service: Orthopedics;  Laterality: Left;  LENGHT OF SURGERY: 90 MINUTES   PLANTAR FASCIA SURGERY     SYNDESMOSIS REPAIR Left 03/26/2022   Procedure: SYNDESMOSIS;  Surgeon: Terance Hart, MD;  Location[facility] OR;  Service: Orthopedics;  Laterality: Left;   TUBAL LIGATION      FAMILY HISTORY: Family History  Problem Relation Age of Onset   Hypertension Father    Fibromyalgia Father    Depression Father    Cancer Father    Bipolar disorder Brother    Fibromyalgia Brother     SOCIAL HISTORY: Social History   Socioeconomic History   Marital status: Single    Spouse name: Not on file   Number of children: Not on file   Years of education: Not on file   Highest education level: Not on file  Occupational History   Not on file  Tobacco Use   Smoking status: Former    Packs/day: 2.00    Years: 3.00    Total pack years: 6.00    Types: Cigarettes    Quit date: 10/03/2013    Years since quitting: 9.0   Smokeless tobacco: Never  Vaping Use   Vaping Use: Never used  Substance and Sexual Activity   Alcohol use: No   Drug use: No   Sexual activity: Not on file    Comment: last intercourse Oct 30th  Other Topics Concern   Not on file  Social History Narrative   Not on file   Social Determinants of Health   Financial Resource Strain: Not on file  Food Insecurity: No Food Insecurity (10/10/2022)   Hunger Vital Sign    Worried About  Running Out of Food in the Last Year: Never true    Ran Out of Food in the Last Year: Never true  Transportation Needs: No Transportation Needs (10/10/2022)   PRAPARE - Administrator, Civil Service (Medical): No    Lack of Transportation (Non-Medical): No  Physical Activity: Not on file  Stress: Not on file  Social Connections: Not on file  Intimate Partner Violence: Not At Risk (10/10/2022)   Humiliation, Afraid, Rape, and Kick questionnaire    Fear of Current or Ex-Partner: No    Emotionally Abused: No    Physically Abused: No    Sexually Abused: No       PHYSICAL EXAM  Vitals:   11/04/22 0855  BP: 117/76  Pulse: 95  Weight: 258 lb (117 kg)  Height: 5\' 5"  (1.651 m)    Body mass index is 42.93 kg/m.  OS 20/50 OD 20/20-1  Colors desaturated OS   General: The patient is well-developed and well-nourished and in no acute distress  HEENT:  Head is /AT.  Sclera are anicteric.  Funduscopic exam shows normal optic discs and retinal vessels.  Neck: No carotid bruits are noted.  The neck is nontender.  Cardiovascular: The heart has a regular rate and rhythm with a normal S1 and S2. There were no murmurs, gallops or rubs.    Skin: Extremities are without rash or  edema.  Musculoskeletal:  Back is nontender  Neurologic Exam  Mental status: The patient is alert and oriented x 3 at the time of the examination. The patient has apparent normal recent and remote memory, with an apparently normal attention span and concentration ability.   Speech is normal.  Cranial nerves: Extraocular movements are full. Pupils are equal, round, and reactive to light and accomodation.  Visual fields are full.  Facial symmetry is present. There is good facial sensation to soft touch bilaterally.Facial strength is normal.  Trapezius and sternocleidomastoid strength is normal. No dysarthria is noted.  The  tongue is midline, and the patient has symmetric elevation of the soft palate. No  obvious hearing deficits are noted.  Motor:  Muscle bulk is normal.   Tone is normal. Strength is  5 / 5 in all 4 extremities.   Sensory: Sensory testing is intact to pinprick, soft touch and vibration sensation in all 4 extremities.  Coordination: Cerebellar testing reveals good finger-nose-finger and heel-to-shin bilaterally.  Gait and station: Station is normal.   Gait is normal. Tandem gait is normal. Romberg is negative.   Reflexes: Deep tendon reflexes are symmetric and normal bilaterally.   Plantar responses are flexor.    DIAGNOSTIC DATA (LABS, IMAGING, TESTING) - I reviewed patient records, labs, notes, testing and imaging myself where available.  Lab Results  Component Value Date   WBC 13.8 (H) 10/30/2022   HGB 13.2 10/30/2022   HCT 40.9 10/30/2022   MCV 85.6 10/30/2022   PLT 432 (H) 10/30/2022      Component Value Date/Time   NA 134 (L) 10/30/2022 1720   K 4.3 10/30/2022 1720   CL 102 10/30/2022 1720   CO2 26 10/30/2022 1720   GLUCOSE 95 10/30/2022 1720   BUN 18 10/30/2022 1720   CREATININE 0.84 10/30/2022 1720   CALCIUM 8.9 10/30/2022 1720   PROT 7.1 10/11/2022 0435   ALBUMIN 3.8 10/11/2022 0435   AST 24 10/11/2022 0435   ALT 21 10/11/2022 0435   ALKPHOS 96 10/11/2022 0435   BILITOT 0.2 (L) 10/11/2022 0435   GFRNONAA >60 10/30/2022 1720   GFRAA >60 01/29/2017 1816       ASSESSMENT AND PLAN  Optic neuritis - Plan: Angiotensin converting enzyme, ANCA Profile, Vitamin B1  Chronic cluster headache, not intractable   In summary, Ms. Felici is a 42 year old woman with an episode of optic neuritis in November 2022 who has been experiencing episodes of headaches, some also involving visual change.  The description of the events of November 2022 and continued reduced visual acuity and color saturation OS are consistent with a history of optic neuritis.  MRI of the brain shows no lesions over a period of 15 months, making a diagnosis of multiple sclerosis less  likely.  Anti-MOG, anti-NMO and most other tests were normal.  Her headaches are not immediately following into any category but description of pain could be consistent with cluster migraine.  She was placed on verapamil but the dose is fairly low.  She has not noted any change.  To try to help her more rapidly, I will have her take Nurtec every other day for the next week while I try to get her approved/authorized for Emgality 300 mg as needed monthly for clusters.  Additionally I will increase her verapamil dose to 120 mg over the day instead of 80 and increase this further if tolerated and needed.  With a couple recent hospital admissions, she has had a fairly thorough evaluation.  I will check angiotensin-converting enzyme, ANCA and thiamine level to rule out rare causes of optic neuritis.  She will return to see me in 4 months or sooner if there are new or worsening neurologic symptoms.  She will also call if no improvement on the anti-CGRP agent.  Thank you for asking me to see Ms. Helmut Muster.  Please let me know if I can be of further assistance with her or other patients in the future.   Stevana Dufner A. Epimenio Foot, MD, [facility] 11/04/2022, 9:37 AM Certified in Neurology, Clinical Neurophysiology, Sleep Medicine and Neuroimaging  [facility] [address] [place], [place] [address] [phone]

## 2022-11-07 LAB — ANGIOTENSIN CONVERTING ENZYME: Angio Convert Enzyme: 41 U/L (ref 14–82)

## 2022-11-07 LAB — VITAMIN B1: Thiamine: 118.9 nmol/L (ref 66.5–200.0)

## 2022-11-07 LAB — ANCA PROFILE
Anti-MPO Antibodies: <0.2 units (ref 0.0–0.9)
Anti-PR3 Antibodies: <0.2 units (ref 0.0–0.9)
Atypical pANCA: <1:20 {titer}
C-ANCA: <1:20 {titer}
P-ANCA: <1:20 {titer}

## 2022-11-21 ENCOUNTER — Telehealth: Payer: Self-pay | Admitting: Neurology

## 2022-11-21 NOTE — Telephone Encounter (Signed)
Pt called stating that her pharmacy informed her that they have sent two request for  her  Galcanezumab-gnlm (EMGALITY, 300 MG DOSE,) 100 MG/ML SOSY and she is wanting to know when will it be worked on. Please advise.

## 2022-11-21 NOTE — Telephone Encounter (Signed)
I called Kristopher Oppenheim and was informed PA is needed.  Sent to prior Auth team. Please see below, please route replies to Pod 1

## 2022-11-27 NOTE — Telephone Encounter (Signed)
  No PA ID/Authorization number provided.

## 2022-11-27 NOTE — Telephone Encounter (Signed)
I called Stacy Deleon to inform pharmacist that PA has been approved.

## 2022-11-27 NOTE — Telephone Encounter (Signed)
Patient Advocate Encounter   Received notification that prior authorization for Emgality 100MG /ML syringes (episodic cluster headache) is required.   PA submitted on 11/27/2022 [place] Medicaid Migraine Calcitonin Agents Preventative Prior Authorization Form Status is pending       Lyndel Safe, [place] Patient Advocate Specialist [place] Patient Advocate Team Direct Number: [phone]  Fax: [phone]

## 2023-03-26 ENCOUNTER — Ambulatory Visit (INDEPENDENT_AMBULATORY_CARE_PROVIDER_SITE_OTHER): Payer: Medicaid Other | Admitting: Neurology

## 2023-03-26 ENCOUNTER — Encounter: Payer: Self-pay | Admitting: Neurology

## 2023-03-26 VITALS — BP 116/87 | HR 82 | Ht 65.0 in | Wt 241.0 lb

## 2023-03-26 DIAGNOSIS — H5713 Ocular pain, bilateral: Secondary | ICD-10-CM

## 2023-03-26 DIAGNOSIS — G43909 Migraine, unspecified, not intractable, without status migrainosus: Secondary | ICD-10-CM

## 2023-03-26 DIAGNOSIS — G44029 Chronic cluster headache, not intractable: Secondary | ICD-10-CM | POA: Diagnosis not present

## 2023-03-26 MED ORDER — NURTEC 75 MG PO TBDP: ORAL_TABLET | ORAL | 11 refills | Status: AC

## 2023-03-26 NOTE — Progress Notes (Signed)
[facility]  PATIENT: Stacy Deleon DOB: 1981/05/05  REFERRING DOCTOR OR PCP: Ladora Daniel, PA-C SOURCE: Patient, emergency room notes, imaging and lab reports, MRI images personally reviewed  _________________________________   HISTORICAL  CHIEF COMPLAINT:  Chief Complaint  Patient presents with   Follow-up    Rm 11. Alone. Headaches are less frequent. Symptoms are the same when she does have headaches. She was only able to administer Emgality twice due to pain. She reports constant discomfort behind her eyes.    HISTORY OF PRESENT ILLNESS:  I had the pleasure seeing your patient, Stacy Deleon, [facility] for neurologic consultation regarding her headaches and history of optic neuritis.  UPDATE 03/26/2023: She has had 3 more episodes of visual blurring.  The Emgality injections were painful so she only did 2-3.   She felt the migraine frequency was reduced and HA improved though she still has a constant low level pain above the eye bilaterally.     This occurs 30/30 days.  She would like to consider  different medication.    She has 8 or so days of more severe migraine pain every month  When a migraine occurs she has photophobia and phonophobia, Nausea and  rare vomiting. Moving intensifies pain when present Sh also feels drained  She saw ophthalmology 4 months ago and ws told the exam was normal.    HA/visual change history: She  had the onset of visual change noted upon awakening one day in November 2022.  This only involved the left eye.    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  She saw [place] retinal specialist and she was referred to the ED  She was admitted and had 5 days of high dose IV steroids.  LP 07/06/2021 was normal (no OCB).   MRI brain was normal.    Improvement was dramatic and she returned to baseline after a few weeks.     She had 3 more small episodes of milder visual change with improvement each time.      She had  another bad episode early February 2024 on the right.     She had blurry vision, black floaters and extreme pain, worse with eye movements.  She was admitted and given IV steroids.  VA has improved but not to baseline.   She continues to have floaters, lightning bolt and pain.    She was readmitted to [facility] and told she had cluster migraines.    A couple weeks later,  she had the onset of milder pain in the back of the right eye.  This pain was 3/10 vs 10/10 in February.    She did not note visual changes OD.    The headache quality is stabbing and in or around the eye.  It is severe in intensity.  Her current headache is 7/10 but she is getting intermittent more severe pain.  She has tremors in her hands and muscle spasms in her legs.   She has tingling in her hands, fingers and feet.    Gait, strength and balance are doing well.   She has some urinary urgency wih rare urge incontinence since mid 2023.       She was started on verapamil 40 mg bid, Robaxin, hydroxyzine and oxycodone.  She does not note any improvement on the verapamil.  Medications tried:  Verapamil, NSAID's, muscle relaxant, , Emgality (injections not well tolerated).   Imaging reviewed: MRI of the orbits 10/21/2022 was normal.  MRI of the brain 10/10/2022 was normal.  MRI of the cervical spine 10/10/2022 showed a normal spinal cord and no significant degenerative change.  Reduce fat signaling bone marrow.   Labs: 10/21/2022 Anti-MOg, anti-NMO were negative.  Bartonella, Lyme, ceruloplasmin, copperENA  CSF 07/06/2021 did not show any oligoclonal bands and was noncontributory  REVIEW OF SYSTEMS: Constitutional: No fevers, chills, sweats, or change in appetite Eyes: No visual changes, double vision, eye pain Ear, nose and throat: No hearing loss, ear pain, nasal congestion, sore throat Cardiovascular: No chest pain, palpitations Respiratory:  No shortness of breath at rest or with exertion.   No wheezes GastrointestinaI:  No nausea, vomiting, diarrhea, abdominal pain, fecal incontinence Genitourinary:  No dysuria, urinary retention or frequency.  No nocturia. Musculoskeletal:  No neck pain, back pain Integumentary: No rash, pruritus, skin lesions Neurological: as above Psychiatric: No depression at this time.  No anxiety Endocrine: No palpitations, diaphoresis, change in appetite, change in weigh or increased thirst Hematologic/Lymphatic:  No anemia, purpura, petechiae. Allergic/Immunologic: No itchy/runny eyes, nasal congestion, recent allergic reactions, rashes  ALLERGIES: Allergies  Allergen Reactions   Codeine Itching   Oxycodone Itching and Other (See Comments)    Takes Benadryl with Percocet to help with itching   Other Anxiety and Other (See Comments)    The patient asked if she is given anything iv push, for it to be done slowly because if it's too fast she has terrible anxiety   Metoclopramide Anxiety    HOME MEDICATIONS:  Current Outpatient Medications:    escitalopram (LEXAPRO) 20 MG tablet, Take 20 mg by mouth every morning., Disp: , Rfl:    hydrOXYzine (ATARAX) 25 MG tablet, Take 25 mg by mouth 3 (three) times daily as needed., Disp: , Rfl:    ibuprofen (ADVIL) 200 MG tablet, Take 400 mg by mouth every 8 (eight) hours as needed for mild pain or headache., Disp: , Rfl:    metFORMIN (GLUCOPHAGE-XR) 500 MG 24 hr tablet, Take 1,000 mg by mouth every evening., Disp: , Rfl:    Rimegepant Sulfate (NURTEC) 75 MG TBDP, One po every other day, Disp: 15 tablet, Rfl: 11   Galcanezumab-gnlm (EMGALITY, 300 MG DOSE,) 100 MG/ML SOSY, Take 300 mg subcu at the onset of cluster period and monthly as needed (Patient not taking: Reported on 03/26/2023), Disp: 3.08 mL, Rfl: 5  PAST MEDICAL HISTORY: Past Medical History:  Diagnosis Date   ADHD (attention deficit hyperactivity disorder)    Anxiety    Bronchitis    Depression    GERD (gastroesophageal reflux disease)    Headache    Incontinence of urine     Neuromuscular disorder (HCC)    MIGRAINES    Optic neuritis    left eye- trouble with vision and eye pain   Optic neuritis 10/2021   Polycystic ovary syndrome    PONV (postoperative nausea and vomiting)    "I wake up really hot"    PAST SURGICAL HISTORY: Past Surgical History:  Procedure Laterality Date   ABDOMINAL EXPOSURE N/A 03/13/2016   Procedure: ABDOMINAL EXPOSURE;  Surgeon: Larina Earthly, MD;  Location: [facility] OR;  Service: Vascular;  Laterality: N/A;   ABDOMINAL HYSTERECTOMY     ANTERIOR LUMBAR FUSION N/A 03/13/2016   Procedure: ANTERIOR LUMBAR FUSION L4-L5    (1 LEVEL)  ALIF;  Surgeon: Venita Lick, MD;  Location: [facility] OR;  Service: Orthopedics;  Laterality: N/A;   HERNIA REPAIR     LUMBAR LAMINECTOMY/DECOMPRESSION MICRODISCECTOMY Left 10/13/2014   Procedure:  LEFT L4-L5 DISCECTOMY    (1 LEVEL);  Surgeon: Venita Lick, MD;  Location: [facility] OR;  Service: Orthopedics;  Laterality: Left;   ORIF ANKLE FRACTURE Left 03/26/2022   Procedure: OPEN TREATMENT OF LEFT LATERAL MALLEOLUS;  Surgeon: Terance Hart, MD;  Location: [facility] OR;  Service: Orthopedics;  Laterality: Left;  LENGHT OF SURGERY: 90 MINUTES   PLANTAR FASCIA SURGERY     SYNDESMOSIS REPAIR Left 03/26/2022   Procedure: SYNDESMOSIS;  Surgeon: Terance Hart, MD;  Location: [facility] OR;  Service: Orthopedics;  Laterality: Left;   TUBAL LIGATION      FAMILY HISTORY: Family History  Problem Relation Age of Onset   Hypertension Father    Fibromyalgia Father    Depression Father    Cancer Father    Bipolar disorder Brother    Fibromyalgia Brother     SOCIAL HISTORY: Social History   Socioeconomic History   Marital status: Single    Spouse name: Not on file   Number of children: Not on file   Years of education: Not on file   Highest education level: Not on file  Occupational History   Not on file  Tobacco Use   Smoking status: Former    Current packs/day: 0.00    Average packs/day: 2.0 packs/day for 3.0 years (6.0  ttl pk-yrs)    Types: Cigarettes    Start date: 10/03/2010    Quit date: 10/03/2013    Years since quitting: 9.4   Smokeless tobacco: Never  Vaping Use   Vaping status: Never Used  Substance and Sexual Activity   Alcohol use: No   Drug use: No   Sexual activity: Not on file    Comment: last intercourse Oct 30th  Other Topics Concern   Not on file  Social History Narrative   Not on file   Social Determinants of Health   Financial Resource Strain: Low Risk  (10/09/2022)   [facility] from [facility], [facility]   Overall Financial Resource Strain (CARDIA)    Difficulty of Paying Living Expenses: Not very hard  Food Insecurity: Low Risk  (10/21/2022)   Received from [facility] visits prior to 11/02/2022., [facility] visits prior to 11/02/2022.   Food    Within the past 12 months, you worried that your food would run out before you got money to buy more food: Never true    Within the past 12 months, the food you bought just didn't last and you didn't have money to get more: Never true  Transportation Needs: No Transportation Needs (10/21/2022)   Received from [facility] visits prior to 11/02/2022., [facility] visits prior to 11/02/2022.   Transportation    In the past 12 months, has lack of reliable transportation kept you from medical appointments, meetings, work or from getting things needed for daily living?: No  Physical Activity: Unknown (02/12/2022)   Received from [facility], [facility]   Exercise Vital Sign    Days of Exercise per Week: 5 days    Minutes of Exercise per Session: Not on file  Stress: No Stress Concern Present (02/12/2022)   Received from [facility], [facility] Occupational [facility] - Occupational Stress Questionnaire    Feeling of Stress : Not at all  Social Connections: Unknown (02/13/2023)   Received from [facility]   Social Network     Social Network: Not on file  Intimate  Partner Violence: Unknown (02/13/2023)   Received from [facility]   HITS    Physically Hurt: Not on file    Insult or Talk Down To: Not on file    Threaten Physical Harm: Not on file    Scream or Curse: Not on file       PHYSICAL EXAM  Vitals:   03/26/23 1529  BP: 116/87  Pulse: 82  Weight: 241 lb (109.3 kg)  Height: 5\' 5"  (1.651 m)    Body mass index is 40.1 kg/m.  OS 20/40 OD 20/30  Colors vision symmetric    General: The patient is well-developed and well-nourished and in no acute distress  HEENT:  Head is [place]/AT.  Sclera are anicteric.  Funduscopic exam shows normal optic discs and retinal vessels.   Skin: Extremities are without rash or  edema.    Neurologic Exam  Mental status: The patient is alert and oriented x 3 at the time of the examination. The patient has apparent normal recent and remote memory, with an apparently normal attention span and concentration ability.   Speech is normal.  Cranial nerves: Extraocular movements are full. Pupils are equal, round, and reactive to light and accomodation.    There is good facial sensation to soft touch bilaterally.Facial strength is normal.  Trapezius and sternocleidomastoid strength is normal. No dysarthria is noted.  No obvious hearing deficits are noted.  Motor:  Muscle bulk is normal.   Tone is normal. Strength is  5 / 5 in all 4 extremities.   Sensory: Sensory testing is intact to pinprick, soft touch and vibration sensation in all 4 extremities.  Coordination: Cerebellar testing reveals good finger-nose-finger and heel-to-shin bilaterally.  Gait and station: Station is normal.   Gait is normal. Tandem gait is normal. Romberg is negative.   Reflexes: Deep tendon reflexes are symmetric and normal bilaterally.       DIAGNOSTIC DATA (LABS, IMAGING, TESTING) - I reviewed patient records, labs, notes, testing and imaging myself where available.  Lab Results   Component Value Date   WBC 13.8 (H) 10/30/2022   HGB 13.2 10/30/2022   HCT 40.9 10/30/2022   MCV 85.6 10/30/2022   PLT 432 (H) 10/30/2022      Component Value Date/Time   NA 134 (L) 10/30/2022 1720   K 4.3 10/30/2022 1720   CL 102 10/30/2022 1720   CO2 26 10/30/2022 1720   GLUCOSE 95 10/30/2022 1720   BUN 18 10/30/2022 1720   CREATININE 0.84 10/30/2022 1720   CALCIUM 8.9 10/30/2022 1720   PROT 7.1 10/11/2022 0435   ALBUMIN 3.8 10/11/2022 0435   AST 24 10/11/2022 0435   ALT 21 10/11/2022 0435   ALKPHOS 96 10/11/2022 0435   BILITOT 0.2 (L) 10/11/2022 0435   GFRNONAA >60 10/30/2022 1720   GFRAA >60 01/29/2017 1816       ASSESSMENT AND PLAN  Episodic migraine  Chronic cluster headache, not intractable  Pain of both eyes  Ubrelvy for episodic migraine prevention.  Will stop Emgality RTC 6 months   Jatavia Keltner A. Epimenio Foot, MD, [facility] 03/26/2023, 4:04 PM Certified in Neurology, Clinical Neurophysiology, Sleep Medicine and Neuroimaging  [facility] [address] [place], [place] [address] [phone]

## 2023-03-28 ENCOUNTER — Telehealth: Payer: Self-pay

## 2023-03-28 ENCOUNTER — Other Ambulatory Visit (HOSPITAL_COMMUNITY): Payer: Self-pay

## 2023-03-28 NOTE — Telephone Encounter (Signed)
Pharmacy Patient Advocate Encounter   Received notification from Patient Pharmacy that prior authorization for Nurtec 75MG  dispersible tablets is required/requested.   Insurance verification completed.   The patient is insured through [facility] .   Per test claim: PA required; PA started via CoverMyMeds. KEY [id number] . Waiting for clinical questions to populate.

## 2023-03-28 NOTE — Telephone Encounter (Signed)
Clinical questions have been submitted-awaiting determination. 

## 2023-03-31 ENCOUNTER — Other Ambulatory Visit (HOSPITAL_COMMUNITY): Payer: Self-pay

## 2023-03-31 NOTE — Telephone Encounter (Signed)
Pharmacy Patient Advocate Encounter  Received notification from [facility] that Prior Authorization for Nurtec 75MG  dispersible tablets has been DENIED. Please advise how you'd like to proceed. Full denial letter will be uploaded to the media tab. See denial reason below.    PA #/Case ID/Reference #: PA Case ID: [id number]

## 2023-03-31 NOTE — Telephone Encounter (Signed)
The pt tried emgality for cluster headaches.  Could not tolerate injection.   We can appeal if she can not tolerate other  injection.

## 2023-04-01 ENCOUNTER — Encounter: Payer: Self-pay | Admitting: Neurology

## 2023-04-01 MED ORDER — NURTEC 75 MG PO TBDP: ORAL_TABLET | ORAL | Status: DC

## 2023-04-01 NOTE — Telephone Encounter (Signed)
I called pt and she will come in for nurtec samples ODT take one tablet every other day.

## 2023-04-01 NOTE — Telephone Encounter (Signed)
I called pt and relayed the denial of nurtec and need to try aimovig or ajovy.  Dr. Epimenio Foot stated would try aimovig then if failed then nurtec retried.    Pt then stated that she has starting of cluster headache, woke her this early am 1230-0100. L eye, pain behind eye socket, 2 inches above eyebrow, check, sinuses. Level 8, throbbing pain , light flashes, nausea. Uses HT Cherene Julian.  Asked about acute treatment in clinic (infusion), what option for her.  She did take emgality 300mg  this early am, to hopefully things not get any worse, but no change as yet.    She will keep thinks as they are right now until her new incurance [employer] kicks in.

## 2023-04-01 NOTE — Telephone Encounter (Signed)
Pt called stating that she is in a lot of pain and she is wanting to know if she would be able to get relief today. Please advise.

## 2023-04-02 ENCOUNTER — Ambulatory Visit (INDEPENDENT_AMBULATORY_CARE_PROVIDER_SITE_OTHER): Payer: Medicaid Other | Admitting: *Deleted

## 2023-04-02 DIAGNOSIS — G43909 Migraine, unspecified, not intractable, without status migrainosus: Secondary | ICD-10-CM | POA: Diagnosis not present

## 2023-04-02 MED ORDER — KETOROLAC TROMETHAMINE 60 MG/2ML IM SOLN
60.0000 mg | Freq: Once | INTRAMUSCULAR | Status: AC
  Administered 2023-04-02: 60 mg via INTRAMUSCULAR

## 2023-04-02 NOTE — Progress Notes (Signed)
Pt here, with daughter, Rm emg -2 for toradol injection.60mg  /110ml verbal order per Dr. Epimenio Foot.  She is not allergic , she has had toradol previously no problems.  Under aseptic technique injected 2ml toradol to R upper outer gluteal quadrant. Tolerated well.  Bandaid applied.  She had taken one nurtec at this time.  She left room to check out, no problems.

## 2023-04-09 ENCOUNTER — Other Ambulatory Visit (HOSPITAL_COMMUNITY): Payer: Self-pay

## 2023-05-20 ENCOUNTER — Other Ambulatory Visit: Payer: Self-pay

## 2023-05-20 ENCOUNTER — Emergency Department (HOSPITAL_BASED_OUTPATIENT_CLINIC_OR_DEPARTMENT_OTHER)
Admission: EM | Admit: 2023-05-20 | Discharge: 2023-05-20 | Disposition: A | Discharge status: Home / Self Care | Payer: 59 | Attending: Emergency Medicine | Admitting: Emergency Medicine

## 2023-05-20 ENCOUNTER — Emergency Department (HOSPITAL_BASED_OUTPATIENT_CLINIC_OR_DEPARTMENT_OTHER): Payer: 59

## 2023-05-20 DIAGNOSIS — R1030 Lower abdominal pain, unspecified: Secondary | ICD-10-CM

## 2023-05-20 DIAGNOSIS — N83201 Unspecified ovarian cyst, right side: Secondary | ICD-10-CM | POA: Insufficient documentation

## 2023-05-20 DIAGNOSIS — N83202 Unspecified ovarian cyst, left side: Secondary | ICD-10-CM | POA: Insufficient documentation

## 2023-05-20 DIAGNOSIS — R1031 Right lower quadrant pain: Secondary | ICD-10-CM | POA: Diagnosis present

## 2023-05-20 LAB — WET PREP, GENITAL
Clue Cells Wet Prep HPF POC: NONE SEEN
Sperm: NONE SEEN
Trich, Wet Prep: NONE SEEN
WBC, Wet Prep HPF POC: <10 (ref <10)
Yeast Wet Prep HPF POC: NONE SEEN

## 2023-05-20 LAB — CBC
HCT: 41.5 % (ref 36.0–46.0)
Hemoglobin: 13.3 g/dL (ref 12.0–15.0)
MCH: 27.7 pg (ref 26.0–34.0)
MCHC: 32 g/dL (ref 30.0–36.0)
MCV: 86.5 fL (ref 80.0–100.0)
Platelets: 434 10*3/uL — ABNORMAL HIGH (ref 150–400)
RBC: 4.8 MIL/uL (ref 3.87–5.11)
RDW: 13.2 % (ref 11.5–15.5)
WBC: 11.6 10*3/uL — ABNORMAL HIGH (ref 4.0–10.5)
nRBC: 0 % (ref 0.0–0.2)

## 2023-05-20 LAB — URINALYSIS, ROUTINE W REFLEX MICROSCOPIC
Bilirubin Urine: NEGATIVE
Glucose, UA: NEGATIVE mg/dL
Hgb urine dipstick: NEGATIVE
Ketones, ur: NEGATIVE mg/dL
Leukocytes,Ua: NEGATIVE
Nitrite: NEGATIVE
Protein, ur: NEGATIVE mg/dL
Specific Gravity, Urine: 1.006 (ref 1.005–1.030)
pH: 6.5 (ref 5.0–8.0)

## 2023-05-20 LAB — COMPREHENSIVE METABOLIC PANEL
ALT: 25 U/L (ref 0–44)
AST: 24 U/L (ref 15–41)
Albumin: 4.2 g/dL (ref 3.5–5.0)
Alkaline Phosphatase: 112 U/L (ref 38–126)
Anion gap: 8 (ref 5–15)
BUN: 12 mg/dL (ref 6–20)
CO2: 30 mmol/L (ref 22–32)
Calcium: 9.4 mg/dL (ref 8.9–10.3)
Chloride: 99 mmol/L (ref 98–111)
Creatinine, Ser: 0.99 mg/dL (ref 0.44–1.00)
GFR, Estimated: >60 mL/min (ref >60)
Glucose, Bld: 108 mg/dL — ABNORMAL HIGH (ref 70–99)
Potassium: 3.8 mmol/L (ref 3.5–5.1)
Sodium: 137 mmol/L (ref 135–145)
Total Bilirubin: 0.3 mg/dL (ref 0.3–1.2)
Total Protein: 7.6 g/dL (ref 6.5–8.1)

## 2023-05-20 LAB — LIPASE, BLOOD: Lipase: 45 U/L (ref 11–51)

## 2023-05-20 LAB — HIV ANTIBODY (ROUTINE TESTING W REFLEX): HIV Screen 4th Generation wRfx: NONREACTIVE

## 2023-05-20 MED ORDER — PROMETHAZINE HCL 25 MG/ML IJ SOLN
INTRAMUSCULAR | Status: AC
  Administered 2023-05-20: 25 mg
  Filled 2023-05-20: qty 1

## 2023-05-20 MED ORDER — ONDANSETRON HCL 4 MG/2ML IJ SOLN
4.0000 mg | Freq: Once | INTRAMUSCULAR | Status: AC
  Administered 2023-05-20: 4 mg via INTRAVENOUS
  Filled 2023-05-20: qty 2

## 2023-05-20 MED ORDER — SODIUM CHLORIDE 0.9 % IV SOLN
25.0000 mg | Freq: Once | INTRAVENOUS | Status: DC
  Filled 2023-05-20: qty 1

## 2023-05-20 MED ORDER — SODIUM CHLORIDE 0.9 % IV BOLUS
1000.0000 mL | Freq: Once | INTRAVENOUS | Status: AC
  Administered 2023-05-20: 1000 mL via INTRAVENOUS

## 2023-05-20 MED ORDER — DIAZEPAM 5 MG/ML IJ SOLN
2.5000 mg | Freq: Once | INTRAMUSCULAR | Status: AC
  Administered 2023-05-20: 2.5 mg via INTRAVENOUS
  Filled 2023-05-20: qty 2

## 2023-05-20 MED ORDER — PROMETHAZINE HCL 25 MG PO TABS: 25.0000 mg | ORAL_TABLET | Freq: Four times a day (QID) | ORAL | 0 refills | Status: AC | PRN

## 2023-05-20 MED ORDER — MORPHINE SULFATE (PF) 4 MG/ML IV SOLN
4.0000 mg | Freq: Once | INTRAVENOUS | Status: AC
  Administered 2023-05-20: 4 mg via INTRAVENOUS
  Filled 2023-05-20: qty 1

## 2023-05-20 MED ORDER — KETOROLAC TROMETHAMINE 30 MG/ML IJ SOLN
30.0000 mg | Freq: Once | INTRAMUSCULAR | Status: AC
  Administered 2023-05-20: 30 mg via INTRAVENOUS
  Filled 2023-05-20: qty 1

## 2023-05-20 MED ORDER — HYDROMORPHONE HCL 1 MG/ML IJ SOLN
1.0000 mg | Freq: Once | INTRAMUSCULAR | Status: AC
  Administered 2023-05-20: 1 mg via INTRAVENOUS
  Filled 2023-05-20: qty 1

## 2023-05-20 MED ORDER — HYDROCODONE-ACETAMINOPHEN 5-325 MG PO TABS: 1.0000 | ORAL_TABLET | ORAL | 0 refills | Status: DC | PRN

## 2023-05-20 NOTE — ED Notes (Signed)
Patient transported to [facility] 

## 2023-05-20 NOTE — ED Provider Notes (Signed)
[facility] Provider Note   CSN: [id number] Arrival date & time: 05/20/23  1437     History  Chief Complaint  Patient presents with   Abdominal Pain    Stacy Deleon is a 42 y.o. female.  Pt is a 41 yo female with pmhx significant for PCOS, anxiety, depression, ADHD, GERD, migraines, and optic neuritis.  Pt said she was at work today and developed severe, lower, bilateral abd pain.  Pt has some associated nausea.  No fever.  Pt did take 2 tramadol and ibuprofen pta.  Pt did have a partial hyst in 2019.  She did call her gyn and they could not get her in until Friday, so she thought she'd come in to the ED.       Home Medications Prior to Admission medications   Medication Sig Start Date End Date Taking? Authorizing Provider  escitalopram (LEXAPRO) 20 MG tablet Take 20 mg by mouth every morning. 04/30/19   [provider]  Galcanezumab-gnlm (EMGALITY, 300 MG DOSE,) 100 MG/ML SOSY Take 300 mg subcu at the onset of cluster period and monthly as needed Patient not taking: Reported on 03/26/2023 11/04/22   Sater, Pearletha Furl, MD  hydrOXYzine (ATARAX) 25 MG tablet Take 25 mg by mouth 3 (three) times daily as needed.    [provider]  ibuprofen (ADVIL) 200 MG tablet Take 400 mg by mouth every 8 (eight) hours as needed for mild pain or headache.    [provider]  metFORMIN (GLUCOPHAGE-XR) 500 MG 24 hr tablet Take 1,000 mg by mouth every evening.    [provider]  Rimegepant Sulfate (NURTEC) 75 MG TBDP One po every other day 03/26/23   Sater, Pearletha Furl, MD  Rimegepant Sulfate (NURTEC) 75 MG TBDP Take one tablet every other day 04/01/23   Sater, Pearletha Furl, MD  sertraline (ZOLOFT) 100 MG tablet Take 100 mg by mouth daily.    11/11/11  [provider]      Allergies    Codeine, Oxycodone, Other, and Metoclopramide    Review of Systems   Review of Systems  Gastrointestinal:  Positive for abdominal  pain and nausea.  All other systems reviewed and are negative.   Physical Exam Updated Vital Signs BP 103/68   Pulse 73   Temp 98.7 F (37.1 C)   Resp 18   Ht 5\' 5"  (1.651 m)   Wt 109 kg   LMP 04/18/2017   SpO2 100%   BMI 39.99 kg/m  Physical Exam Vitals and nursing note reviewed.  Constitutional:      Appearance: She is well-developed. She is obese.  HENT:     Head: Normocephalic and atraumatic.     Mouth/Throat:     Mouth: Mucous membranes are moist.     Pharynx: Oropharynx is clear.  Eyes:     Extraocular Movements: Extraocular movements intact.     Pupils: Pupils are equal, round, and reactive to light.  Cardiovascular:     Rate and Rhythm: Normal rate and regular rhythm.     Heart sounds: Normal heart sounds.  Pulmonary:     Effort: Pulmonary effort is normal.     Breath sounds: Normal breath sounds.  Abdominal:     General: Abdomen is flat. Bowel sounds are normal.     Palpations: Abdomen is soft.     Tenderness: There is abdominal tenderness in the right lower quadrant and left lower quadrant.  Genitourinary:  Vagina: Normal.     Cervix: Normal.     Uterus: Absent.      Adnexa:        Right: Tenderness present.        Left: Tenderness present.   Skin:    General: Skin is warm.     Capillary Refill: Capillary refill takes less than 2 seconds.  Neurological:     General: No focal deficit present.     Mental Status: She is alert and oriented to person, place, and time.  Psychiatric:        Mood and Affect: Mood normal.        Behavior: Behavior normal.     ED Results / Procedures / Treatments   Labs (all labs ordered are listed, but only abnormal results are displayed) Labs Reviewed  COMPREHENSIVE METABOLIC PANEL - Abnormal; Notable for the following components:      Result Value   Glucose, Bld 108 (*)    All other components within normal limits  CBC - Abnormal; Notable for the following components:   WBC 11.6 (*)    Platelets 434 (*)    All  other components within normal limits  URINALYSIS, ROUTINE W REFLEX MICROSCOPIC - Abnormal; Notable for the following components:   Color, Urine COLORLESS (*)    All other components within normal limits  WET PREP, GENITAL  LIPASE, BLOOD  HIV ANTIBODY (ROUTINE TESTING W REFLEX)  GC/CHLAMYDIA PROBE AMP ([place]) NOT AT [facility]    EKG None  Radiology US PELVIC COMPLETE W TRANSVAGINAL AND TORSION R/O  Result Date: 05/20/2023 CLINICAL DATA:  Pelvic pain.  Hysterectomy in 2019. EXAM: TRANSABDOMINAL AND TRANSVAGINAL ULTRASOUND OF PELVIS DOPPLER ULTRASOUND OF OVARIES TECHNIQUE: Both transabdominal and transvaginal ultrasound examinations of the pelvis were performed. Transabdominal technique was performed for global imaging of the pelvis including uterus, ovaries, adnexal regions, and pelvic cul-de-sac. It was necessary to proceed with endovaginal exam following the transabdominal exam to visualize the ovaries and adnexa. Color and duplex Doppler ultrasound was utilized to evaluate blood flow to the ovaries. COMPARISON:  None Available. FINDINGS: Uterus Surgically absent. Right ovary Measurements: 3.2 x 2.8 x 2.5 cm = volume: 11.7 mL. Cyst with peripheral vascularity measuring 1.8 cm cyst the appearance of a physiologic corpus luteal cyst. There is a second similar appearing 1.6 cm cyst with surrounding vascularity that may represent a second corpus luteum. Ovarian blood flow is seen. No extra ovarian adnexal mass. Left ovary Measurements: 1.9 x 2.1 x 1.8 cm = volume: 3.8 mL. Normal appearance of physiologic follicles. Possible small exophytic follicle versus para ovarian cyst measuring less than 1 cm, of doubtful clinical significance. Ovarian blood flow is seen. No adnexal mass. Pulsed Doppler evaluation of both ovaries demonstrates normal low-resistance arterial and venous waveforms. Other findings Trace free fluid in the pelvis. IMPRESSION: 1. Peripherally vascular cysts in the right ovary are typical  of corpus luteum, these are considered physiologic. No specific imaging follow-up is recommended unless symptoms persist. 2. Ovarian blood flow is demonstrated, no torsion. 3. Hysterectomy. Electronically Signed   By: Narda Rutherford M.D.   On: 05/20/2023 18:45    Procedures Procedures    Medications Ordered in ED Medications  promethazine (PHENERGAN) 25 mg in sodium chloride 0.9 % 50 mL IVPB (has no administration in time range)  ketorolac (TORADOL) 30 MG/ML injection 30 mg (30 mg Intravenous Given 05/20/23 1705)  sodium chloride 0.9 % bolus 1,000 mL (1,000 mLs Intravenous New Bag/Given 05/20/23 1729)  ondansetron (  ZOFRAN) injection 4 mg (4 mg Intravenous Given 05/20/23 1702)  diazepam (VALIUM) injection 2.5 mg (2.5 mg Intravenous Given 05/20/23 1659)  morphine (PF) 4 MG/ML injection 4 mg (4 mg Intravenous Given 05/20/23 1828)    ED Course/ Medical Decision Making/ A&P                                 Medical Decision Making Amount and/or Complexity of Data Reviewed Labs: ordered. Radiology: ordered.  Risk Prescription drug management.   This patient presents to the ED for concern of abd pain, this involves an extensive number of treatment options, and is a complaint that carries with it a high risk of complications and morbidity.  The differential diagnosis includes appy, uti, pcos   Co morbidities that complicate the patient evaluation   PCOS, anxiety, depression, ADHD, GERD, migraines, and optic neuritis   Additional history obtained:  Additional history obtained from epic chart review External records from outside source obtained and reviewed including husband   Lab Tests:  I Ordered, and personally interpreted labs.  The pertinent results include:  cbc with wbc elevated at 11.6, cmp nl, lip nl, ua nl   Imaging Studies ordered:  I ordered imaging studies including [place]  I independently visualized and interpreted imaging which showed  Peripherally vascular cysts in  the right ovary are typical of  corpus luteum, these are considered physiologic. No specific imaging  follow-up is recommended unless symptoms persist.  2. Ovarian blood flow is demonstrated, no torsion.  3. Hysterectomy.   I agree with the radiologist interpretation   Cardiac Monitoring:  The patient was maintained on a cardiac monitor.  I personally viewed and interpreted the cardiac monitored which showed an underlying rhythm of: nsr   Medicines ordered and prescription drug management:  I ordered medication including toradol, morphine, zofran/phenergan  for sx  Reevaluation of the patient after these medicines showed that the patient improved I have reviewed the patients home medicines and have made adjustments as needed   Test Considered:  [place]   Critical Interventions:  Pain control   Problem List / ED Course:  Abd pain:  likely due to ovarian cysts.  Pt is feeling better.  She is stable for d/c.  She has a gyn appt on Friday (9/20).  She is to keep that appt.   Reevaluation:  After the interventions noted above, I reevaluated the patient and found that they have :improved   Social Determinants of Health:  Lives at home   Dispostion:  After consideration of the diagnostic results and the patients response to treatment, I feel that the patent would benefit from discharge with outpatient f/u.          Final Clinical Impression(s) / ED Diagnoses Final diagnoses:  Lower abdominal pain  Cysts of both ovaries    Rx / DC Orders ED Discharge Orders     None         Jacalyn Lefevre, MD 05/20/23 [phone]

## 2023-05-20 NOTE — ED Triage Notes (Addendum)
Sudden onset lower abd and groin pain ~1330. +N/-V. Also complaining of lower back pain. HX partial hysterectomy (2019)-still has both ovaries and fallopian tubes-HX endometriosis, fibroids, and PCOS. No urinary changes. Denies Cp, SOB. No fevers at home.  Took 2 50mg  tramadol at 1600 and 400mg  ibuprofen  Could not get into OBGYN until Friday.  HX spinal surgery with anterior approach and abd mesh placement.

## 2023-05-20 NOTE — ED Notes (Signed)
ED Provider at bedside. 

## 2023-05-23 ENCOUNTER — Encounter (HOSPITAL_BASED_OUTPATIENT_CLINIC_OR_DEPARTMENT_OTHER): Payer: Self-pay | Admitting: Emergency Medicine

## 2023-05-23 ENCOUNTER — Emergency Department (HOSPITAL_BASED_OUTPATIENT_CLINIC_OR_DEPARTMENT_OTHER)
Admission: EM | Admit: 2023-05-23 | Discharge: 2023-05-23 | Disposition: A | Discharge status: Home / Self Care | Payer: 59 | Attending: Emergency Medicine | Admitting: Emergency Medicine

## 2023-05-23 ENCOUNTER — Emergency Department (HOSPITAL_BASED_OUTPATIENT_CLINIC_OR_DEPARTMENT_OTHER): Payer: 59

## 2023-05-23 ENCOUNTER — Other Ambulatory Visit: Payer: Self-pay

## 2023-05-23 DIAGNOSIS — R1031 Right lower quadrant pain: Secondary | ICD-10-CM | POA: Diagnosis not present

## 2023-05-23 DIAGNOSIS — R1032 Left lower quadrant pain: Secondary | ICD-10-CM | POA: Insufficient documentation

## 2023-05-23 DIAGNOSIS — R102 Pelvic and perineal pain: Secondary | ICD-10-CM | POA: Insufficient documentation

## 2023-05-23 DIAGNOSIS — D72829 Elevated white blood cell count, unspecified: Secondary | ICD-10-CM | POA: Diagnosis not present

## 2023-05-23 DIAGNOSIS — R1030 Lower abdominal pain, unspecified: Secondary | ICD-10-CM | POA: Diagnosis present

## 2023-05-23 LAB — URINALYSIS, ROUTINE W REFLEX MICROSCOPIC
Bilirubin Urine: NEGATIVE
Glucose, UA: NEGATIVE mg/dL
Hgb urine dipstick: NEGATIVE
Ketones, ur: NEGATIVE mg/dL
Leukocytes,Ua: NEGATIVE
Nitrite: NEGATIVE
Protein, ur: NEGATIVE mg/dL
Specific Gravity, Urine: 1.008 (ref 1.005–1.030)
pH: 7 (ref 5.0–8.0)

## 2023-05-23 LAB — CBC WITH DIFFERENTIAL/PLATELET
Abs Immature Granulocytes: 0.04 10*3/uL (ref 0.00–0.07)
Basophils Absolute: 0.1 10*3/uL (ref 0.0–0.1)
Basophils Relative: 1 %
Eosinophils Absolute: 0.6 10*3/uL — ABNORMAL HIGH (ref 0.0–0.5)
Eosinophils Relative: 6 %
HCT: 42.7 % (ref 36.0–46.0)
Hemoglobin: 13.7 g/dL (ref 12.0–15.0)
Immature Granulocytes: 0 %
Lymphocytes Relative: 28 %
Lymphs Abs: 3 10*3/uL (ref 0.7–4.0)
MCH: 27.6 pg (ref 26.0–34.0)
MCHC: 32.1 g/dL (ref 30.0–36.0)
MCV: 86.1 fL (ref 80.0–100.0)
Monocytes Absolute: 0.9 10*3/uL (ref 0.1–1.0)
Monocytes Relative: 8 %
Neutro Abs: 6.3 10*3/uL (ref 1.7–7.7)
Neutrophils Relative %: 57 %
Platelets: 425 10*3/uL — ABNORMAL HIGH (ref 150–400)
RBC: 4.96 MIL/uL (ref 3.87–5.11)
RDW: 13.3 % (ref 11.5–15.5)
WBC: 11 10*3/uL — ABNORMAL HIGH (ref 4.0–10.5)
nRBC: 0 % (ref 0.0–0.2)

## 2023-05-23 LAB — COMPREHENSIVE METABOLIC PANEL
ALT: 24 U/L (ref 0–44)
AST: 25 U/L (ref 15–41)
Albumin: 4 g/dL (ref 3.5–5.0)
Alkaline Phosphatase: 101 U/L (ref 38–126)
Anion gap: 6 (ref 5–15)
BUN: 12 mg/dL (ref 6–20)
CO2: 29 mmol/L (ref 22–32)
Calcium: 9.5 mg/dL (ref 8.9–10.3)
Chloride: 101 mmol/L (ref 98–111)
Creatinine, Ser: 0.89 mg/dL (ref 0.44–1.00)
GFR, Estimated: >60 mL/min (ref >60)
Glucose, Bld: 79 mg/dL (ref 70–99)
Potassium: 4.7 mmol/L (ref 3.5–5.1)
Sodium: 136 mmol/L (ref 135–145)
Total Bilirubin: 0.4 mg/dL (ref 0.3–1.2)
Total Protein: 7.2 g/dL (ref 6.5–8.1)

## 2023-05-23 LAB — LIPASE, BLOOD: Lipase: 27 U/L (ref 11–51)

## 2023-05-23 MED ORDER — ONDANSETRON HCL 4 MG/2ML IJ SOLN
4.0000 mg | Freq: Once | INTRAMUSCULAR | Status: AC
  Administered 2023-05-23: 4 mg via INTRAVENOUS
  Filled 2023-05-23: qty 2

## 2023-05-23 MED ORDER — KETOROLAC TROMETHAMINE 30 MG/ML IJ SOLN
30.0000 mg | Freq: Once | INTRAMUSCULAR | Status: AC
  Administered 2023-05-23: 30 mg via INTRAVENOUS
  Filled 2023-05-23: qty 1

## 2023-05-23 MED ORDER — IOHEXOL 300 MG/ML  SOLN
100.0000 mL | Freq: Once | INTRAMUSCULAR | Status: AC | PRN
  Administered 2023-05-23: 100 mL via INTRAVENOUS

## 2023-05-23 MED ORDER — HYDROMORPHONE HCL 2 MG PO TABS: 2.0000 mg | ORAL_TABLET | Freq: Four times a day (QID) | ORAL | 0 refills | Status: AC | PRN

## 2023-05-23 MED ORDER — MIDAZOLAM HCL 2 MG/2ML IJ SOLN
2.0000 mg | Freq: Once | INTRAMUSCULAR | Status: AC
  Administered 2023-05-23: 2 mg via INTRAVENOUS
  Filled 2023-05-23: qty 2

## 2023-05-23 MED ORDER — HYDROCODONE-ACETAMINOPHEN 5-325 MG PO TABS
1.0000 | ORAL_TABLET | Freq: Once | ORAL | Status: AC
  Administered 2023-05-23: 1 via ORAL
  Filled 2023-05-23: qty 1

## 2023-05-23 MED ORDER — HYDROMORPHONE HCL 1 MG/ML IJ SOLN
1.0000 mg | Freq: Once | INTRAMUSCULAR | Status: AC
  Administered 2023-05-23: 1 mg via INTRAVENOUS
  Filled 2023-05-23: qty 1

## 2023-05-23 NOTE — Discharge Instructions (Addendum)
I prescribed you a strong narcotic pain medication called Dilaudid to help with your pain over the weekend until you are able to see your OB/GYN provider on Tuesday for your next appointment.  Please be careful not to take this medicine at the same time as any other narcotic, including Norco, oxycodone or Percocet.  Taking these medications together can put you at risk for slowed breathing and overdose and death.  Use this medicine only as prescribed.  Do not drive or operate machinery or swim after taking narcotic medicines.

## 2023-05-23 NOTE — ED Triage Notes (Signed)
Pt seen on Tuesday for ruptured cysts and given pain & nausea medication Rx. Pt reports pain has worsened since and nausea persists.  Denies vag bleeding.  AAOx4, NAD in triage.

## 2023-05-23 NOTE — ED Notes (Signed)
Pt given discharge instructions and reviewed prescriptions. Opportunities given for questions. Pt verbalizes understanding. PIV removed x1. Stone,Heather R, RN 

## 2023-05-23 NOTE — ED Notes (Signed)
Pt states she needs anti-anxiety medication before getting her CT. EDP notified

## 2023-05-23 NOTE — ED Provider Notes (Signed)
[facility] EMERGENCY DEPARTMENT AT [facility] Provider Note   CSN: [id number] Arrival date & time: 05/23/23  4098     History  Chief Complaint  Patient presents with   Abdominal Pain    Stacy Deleon is a 42 y.o. female with history of PCOS, endometriosis, partial hysterectomy, presenting to the ED with complaint of persistent lower abdominal and pelvic pain.  Patient was seen in the ED three days ago on 05/20/23 per my review of records.  She came in complaining of pelvic and lower abdominal pain and had a pelvic ultrasound performed, which showed peripheral vascular cyst on the right ovary is typical of corpus luteum, no evidence of acute torsion, status post hysterectomy.  She was given pain medications and discharge.  She has been taking her Norco, alternating ibuprofen and Tylenol at home, as well as Compazine for nausea, and reports she still has significant pain in her lower extremity.  The pain is worse with movement.  She feels nauseated from her pain.  She feels bloated.  She does report a history of endometriosis that was diagnosed on pathology report and reports she has had pelvic pain but never like this before.  She has an upcoming appointment with a new gynecology clinic [facility] on Tuesday, as her former gynecologist could not schedule another appointment for another 3 months.  HPI     Home Medications Prior to Admission medications   Medication Sig Start Date End Date Taking? Authorizing Provider  HYDROmorphone (DILAUDID) 2 MG tablet Take 1 tablet (2 mg total) by mouth every 6 (six) hours as needed for up to 18 doses for severe pain. 05/23/23  Yes Terald Sleeper, MD  escitalopram (LEXAPRO) 20 MG tablet Take 20 mg by mouth every morning. 04/30/19   [provider]  Galcanezumab-gnlm (EMGALITY, 300 MG DOSE,) 100 MG/ML SOSY Take 300 mg subcu at the onset of cluster period and monthly as needed Patient not taking: Reported on 03/26/2023  11/04/22   Sater, Pearletha Furl, MD  HYDROcodone-acetaminophen (NORCO/VICODIN) 5-325 MG tablet Take 1 tablet by mouth every 4 (four) hours as needed. 05/20/23   Jacalyn Lefevre, MD  hydrOXYzine (ATARAX) 25 MG tablet Take 25 mg by mouth 3 (three) times daily as needed.    [provider]  ibuprofen (ADVIL) 200 MG tablet Take 400 mg by mouth every 8 (eight) hours as needed for mild pain or headache.    [provider]  metFORMIN (GLUCOPHAGE-XR) 500 MG 24 hr tablet Take 1,000 mg by mouth every evening.    [provider]  promethazine (PHENERGAN) 25 MG tablet Take 1 tablet (25 mg total) by mouth every 6 (six) hours as needed for nausea or vomiting. 05/20/23   Jacalyn Lefevre, MD  Rimegepant Sulfate (NURTEC) 75 MG TBDP One po every other day 03/26/23   Sater, Pearletha Furl, MD  Rimegepant Sulfate (NURTEC) 75 MG TBDP Take one tablet every other day 04/01/23   Asa Lente, MD  sertraline (ZOLOFT) 100 MG tablet Take 100 mg by mouth daily.    11/11/11  [provider]      Allergies    Codeine, Oxycodone, Other, and Metoclopramide    Review of Systems   Review of Systems  Physical Exam Updated Vital Signs BP 106/72   Pulse 85   Temp 99.1 F (37.3 C) (Oral)   Resp 18   LMP 04/18/2017   SpO2 100%  Physical Exam Constitutional:      General:  She is not in acute distress.    Appearance: She is obese.  HENT:     Head: Normocephalic and atraumatic.  Eyes:     Conjunctiva/sclera: Conjunctivae normal.     Pupils: Pupils are equal, round, and reactive to light.  Cardiovascular:     Rate and Rhythm: Normal rate and regular rhythm.  Pulmonary:     Effort: Pulmonary effort is normal. No respiratory distress.  Abdominal:     General: There is no distension.     Tenderness: There is abdominal tenderness in the right lower quadrant, suprapubic area and left lower quadrant.  Skin:    General: Skin is warm and dry.  Neurological:     General: No focal deficit present.      Mental Status: She is alert. Mental status is at baseline.  Psychiatric:        Mood and Affect: Mood normal.        Behavior: Behavior normal.     ED Results / Procedures / Treatments   Labs (all labs ordered are listed, but only abnormal results are displayed) Labs Reviewed  CBC WITH DIFFERENTIAL/PLATELET - Abnormal; Notable for the following components:      Result Value   WBC 11.0 (*)    Platelets 425 (*)    Eosinophils Absolute 0.6 (*)    All other components within normal limits  URINALYSIS, ROUTINE W REFLEX MICROSCOPIC - Abnormal; Notable for the following components:   Color, Urine COLORLESS (*)    All other components within normal limits  COMPREHENSIVE METABOLIC PANEL  LIPASE, BLOOD    EKG None  Radiology CT ABDOMEN PELVIS W CONTRAST  Result Date: 05/23/2023 CLINICAL DATA:  Right lower quadrant pain. EXAM: CT ABDOMEN AND PELVIS WITH CONTRAST TECHNIQUE: Multidetector CT imaging of the abdomen and pelvis was performed using the standard protocol following bolus administration of intravenous contrast. RADIATION DOSE REDUCTION: This exam was performed according to the departmental dose-optimization program which includes automated exposure control, adjustment of the mA and/or kV according to patient size and/or use of iterative reconstruction technique. CONTRAST:  OMNIPAQUE IOHEXOL 300 MG/ML  SOLN COMPARISON:  CT abdomen/pelvis 04/08/2013 FINDINGS: Lower chest: The lung bases are clear. The imaged heart is unremarkable. Hepatobiliary: The liver and gallbladder are unremarkable. There is no biliary ductal dilatation. Pancreas: Unremarkable. Spleen: Unremarkable. Adrenals/Urinary Tract: The adrenals are unremarkable. The kidneys are unremarkable, with no focal lesion, stone, hydronephrosis, or hydroureter. The bladder is unremarkable. Stomach/Bowel: The stomach is unremarkable. There is no evidence of bowel obstruction. There is no abnormal bowel wall thickening or  inflammatory change. The appendix is normal. Vascular/Lymphatic: The abdominal aorta is normal in course and caliber. The major branch vessels are patent. The main portal and splenic veins are patent. There is no abdominopelvic lymphadenopathy. Reproductive: Uterus is surgically absent. There is no adnexal mass. Other: There is trace free fluid in the pelvis, nonspecific and within physiologic limits. There is no upper abdominal ascites. There is no free intraperitoneal air. There is a small fat containing umbilical hernia. Mesh is in place more superiorly reflecting prior ventral abdominal hernia repair. Musculoskeletal: There is no acute osseous abnormality or suspicious osseous lesion. The patient is status post anterior fusion at L4-L5 without evidence of complication. IMPRESSION: No acute finding in the abdomen or pelvis. Trace free fluid in the pelvis is nonspecific. Electronically Signed   By: Lesia Hausen M.D.   On: 05/23/2023 13:13    Procedures Procedures    Medications Ordered in  ED Medications  HYDROcodone-acetaminophen (NORCO/VICODIN) 5-325 MG per tablet 1 tablet (has no administration in time range)  iohexol (OMNIPAQUE) 300 MG/ML solution 100 mL (100 mLs Intravenous Contrast Given 05/23/23 1152)  midazolam (VERSED) injection 2 mg (2 mg Intravenous Given 05/23/23 1140)  HYDROmorphone (DILAUDID) injection 1 mg (1 mg Intravenous Given 05/23/23 1311)  ketorolac (TORADOL) 30 MG/ML injection 30 mg (30 mg Intravenous Given 05/23/23 1311)  ondansetron (ZOFRAN) injection 4 mg (4 mg Intravenous Given 05/23/23 1310)    ED Course/ Medical Decision Making/ A&P Clinical Course as of 05/23/23 1452  Fri May 23, 2023  1426 Patient reassessed and pain appears to be improved after the IV Dilaudid.  There were no emergent findings on her CT imaging and her labs appear stable from 3 days ago evaluation.  I suspect that her pain is most likely related to endometriosis.  She has a follow-up appointment on  Tuesday but is needing additional pain medications in the meantime.  We discussed an oral Dilaudid regimen which may achieve better pain control for her over the weekend.  She is comfortable with this plan.  We did discuss alternative of observation and pain control in the hospital but she would prefer to go home. [MT]    Clinical Course User Index [MT] Fayette Gasner, Kermit Balo, MD                                 Medical Decision Making Amount and/or Complexity of Data Reviewed Labs: ordered. Radiology: ordered.  Risk Prescription drug management.   This patient presents to the ED with concern for lower abdominal or pelvic pain. This involves an extensive number of treatment options, and is a complaint that carries with it a high risk of complications and morbidity.  The differential diagnosis includes intra-abdominal process including acute appendicitis for cystitis versus other, versus pelvic process including ruptured ovarian cyst versus endometriosis versus other  At this time I think it is reasonable to proceed with CT imaging to evaluate for potential abdominal pathology including appendicitis which may be contributing to her lower abdominal pain.  Will also recheck her blood counts including white blood cell count.  Co-morbidities that complicate the patient evaluation: History of endometriosis, at risk of recurrence  External records from outside source obtained and reviewed including laparoscopic hysterectomy report [facility] fom 04/07/2018  I ordered and personally interpreted labs.  The pertinent results include: No emergent findings.  Leukocytosis is very minor and stable  I ordered imaging studies including CT abdomen pelvis I independently visualized and interpreted imaging which showed no emergent findings noted I agree with the radiologist interpretation   I ordered medication including IV pain and nausea medication  I have reviewed the patients home medicines and have made  adjustments as needed  Test Considered: I do not see an emergent indication for repeat pelvic ultrasound at this time.  Low suspicion for PID or TOA or ovarian torsion  After the interventions noted above, I reevaluated the patient and found that they have: improved   Dispostion:  After consideration of the diagnostic results and the patients response to treatment, I feel that the patent would benefit from outpatient specialist follow-up.         Final Clinical Impression(s) / ED Diagnoses Final diagnoses:  Pelvic pain    Rx / DC Orders ED Discharge Orders          Ordered    HYDROmorphone (DILAUDID)  2 MG tablet  Every 6 hours PRN       Note to Pharmacy: PDMP and prior narcotic prescriptions reviewed and regimen discussed with patient   05/23/23 1428              Tonio Seider, Kermit Balo, MD 05/23/23 1452

## 2023-05-23 NOTE — ED Notes (Signed)
Pt last took 1 hydrocodone and 2 ibuprofen at home at approx 0300 today.

## 2023-06-19 ENCOUNTER — Encounter (HOSPITAL_BASED_OUTPATIENT_CLINIC_OR_DEPARTMENT_OTHER): Payer: Self-pay | Admitting: Obstetrics and Gynecology

## 2023-06-19 NOTE — Progress Notes (Addendum)
Spoke w/ via phone for pre-op interview--- [facility] needs dos----    CBC, T&S and HIV     Lab results------ COVID test -----patient states asymptomatic no test needed Arrive at -------0530 NPO after MN NO Solid Food.   Med rec completed Medications to take morning of surgery ----- Lexapro. Dilaudid and Phenergan PRN. Diabetic medication ----- Patient instructed no nail polish to be worn day of surgery Patient instructed to bring photo id and insurance card day of surgery Patient aware to have Driver (ride ) / caregiver    for 24 hours after surgery - Unsure but aware she has to have ride and caregiver for 24 hrs after. Patient Special Instructions ----- Pre-Op special Instructions ----- Patient takes Reginal Lutes last dose 06/10/23, patient aware that she should not take a dose 7 days prior to surgery. Patient verbalized understanding of instructions that were given at this phone interview. Patient denies chest pain, sob, fever, cough at the interview.

## 2023-06-30 NOTE — H&P (Signed)
Gynecology History and Physical Preoperative H&P for scheduled procedure.   Stacy Deleon is a 42 y.o. female [phone] with a history of endometriosis, pelvic pain, PCOS, s/p laparoscopic hysterectomy with bilateral salpingectomy 2019  presenting for scheduled laparoscopic bilateral oophorectomy.   She also has a surgical history of hernia repair with mesh and spinal fusion via anterior approach.   She presented as new patient with increasing and ongoing pelvic pain secondary to perceived persistent endometriosis.  This has been debilitating pain and interfering with work and daily activities. She had a negative workups for multiple ED visits. She has tried multiple hormonal approaches to endometriosis in the past, and declines Lupron as this is temporary and she prefers surgical management.    OB History     Gravida  3   Para  3   Term  3   Preterm      AB      Living  3      SAB      IAB      Ectopic      Multiple      Live Births             Past Medical History:  Diagnosis Date   ADHD (attention deficit hyperactivity disorder)    Anxiety    Bronchitis    Depression    GERD (gastroesophageal reflux disease)    Headache    Incontinence of urine    Neuromuscular disorder (HCC)    MIGRAINES    Optic neuritis    left eye- trouble with vision and eye pain   Optic neuritis 10/2021   Polycystic ovary syndrome    PONV (postoperative nausea and vomiting)    "I wake up really hot"   Past Surgical History:  Procedure Laterality Date   ABDOMINAL EXPOSURE N/A 03/13/2016   Procedure: ABDOMINAL EXPOSURE;  Surgeon: Larina Earthly, MD;  Location: [facility] OR;  Service: Vascular;  Laterality: N/A;   ABDOMINAL HYSTERECTOMY     ANTERIOR LUMBAR FUSION N/A 03/13/2016   Procedure: ANTERIOR LUMBAR FUSION L4-L5    (1 LEVEL)  ALIF;  Surgeon: Venita Lick, MD;  Location: [facility] OR;  Service: Orthopedics;  Laterality: N/A;   HERNIA REPAIR     LUMBAR LAMINECTOMY/DECOMPRESSION  MICRODISCECTOMY Left 10/13/2014   Procedure: LEFT L4-L5 DISCECTOMY    (1 LEVEL);  Surgeon: Venita Lick, MD;  Location: [facility] OR;  Service: Orthopedics;  Laterality: Left;   ORIF ANKLE FRACTURE Left 03/26/2022   Procedure: OPEN TREATMENT OF LEFT LATERAL MALLEOLUS;  Surgeon: Terance Hart, MD;  Location: [facility] OR;  Service: Orthopedics;  Laterality: Left;  LENGHT OF SURGERY: 90 MINUTES   PLANTAR FASCIA SURGERY     SYNDESMOSIS REPAIR Left 03/26/2022   Procedure: SYNDESMOSIS;  Surgeon: Terance Hart, MD;  Location: [facility] OR;  Service: Orthopedics;  Laterality: Left;   TUBAL LIGATION     Family History: family history includes Bipolar disorder in her brother; Cancer in her father; Depression in her father; Fibromyalgia in her brother and father; Hypertension in her father. Social History:  reports that she quit smoking about 9 years ago. Her smoking use included cigarettes. She started smoking about 12 years ago. She has a 6 pack-year smoking history. She has never used smokeless tobacco. She reports that she does not drink alcohol and does not use drugs.   Review of Systems - Patient denies fever, chills, SOB, CP, N/V/D.  History   Height 5\' 5"  (1.651 m), weight 103 kg,  last menstrual period 04/18/2017. Exam Physical Exam   Gen: alert, well appearing, no distress Chest: nonlabored breathing CV: no peripheral edema Abdomen: soft, nontender Ext: no evidence of DVT    Assessment/Plan: Admit for planned procedure Discussed possible return of endometriosis pain and benefits of hormonal suppression. She has tried many methods in the past without success. We considered Lupron, however she has been scheduled for surgery next week. Extensive surgical history, and discussed increasing risk of injury or complex procedure due to adhesive disease, especially with mesh present. Prior GYN operative note did not describe excessive scarring, however she has since has a spinal procedure via anterior  approach. Discussed plan for LUQ/[place] entry to assess for adhesive disease. Discussed possible conversion to open and indications for such. Discussed that given essentially normal imaging and that she is already s/p hysterectomy, a bilateral oophorectomy may not resolve or improve her pain. She has had multiple spinal surgeries and could be nerve pain or from adhesive disease. Discussed the risks of pre-menopausal oophorectomy and surgical menopause, including effects on cardiovascular health, bone health, cognition, vasomotor symptoms, all cause mortality (except ovarian cancer). Will plan for hormone replacement therapy following surgery to lessen these risks and improve symptoms.  She continues to feel strongly that recurrent endo pain is worth oophorectomy. Will proceed as planned. Discussed risks, which include but are not limited to bleeding, infection, damage to nearby organs. Again reiterated that this may not resolve her pain.  Lyn Henri 06/30/2023, 8:24 PM

## 2023-07-01 ENCOUNTER — Encounter (HOSPITAL_BASED_OUTPATIENT_CLINIC_OR_DEPARTMENT_OTHER)
Admission: RE | Disposition: A | Discharge status: Home / Self Care | Payer: Self-pay | Source: Home / Self Care | Attending: Obstetrics and Gynecology

## 2023-07-01 ENCOUNTER — Other Ambulatory Visit: Payer: Self-pay

## 2023-07-01 ENCOUNTER — Ambulatory Visit (HOSPITAL_BASED_OUTPATIENT_CLINIC_OR_DEPARTMENT_OTHER): Payer: 59 | Admitting: Anesthesiology

## 2023-07-01 ENCOUNTER — Ambulatory Visit (HOSPITAL_BASED_OUTPATIENT_CLINIC_OR_DEPARTMENT_OTHER)
Admission: RE | Admit: 2023-07-01 | Discharge: 2023-07-01 | Disposition: A | Discharge status: Home / Self Care | Payer: 59 | Attending: Obstetrics and Gynecology | Admitting: Obstetrics and Gynecology

## 2023-07-01 ENCOUNTER — Encounter (HOSPITAL_BASED_OUTPATIENT_CLINIC_OR_DEPARTMENT_OTHER): Payer: Self-pay | Admitting: Obstetrics and Gynecology

## 2023-07-01 DIAGNOSIS — R102 Pelvic and perineal pain: Secondary | ICD-10-CM | POA: Insufficient documentation

## 2023-07-01 DIAGNOSIS — G8929 Other chronic pain: Secondary | ICD-10-CM | POA: Diagnosis present

## 2023-07-01 DIAGNOSIS — Z87891 Personal history of nicotine dependence: Secondary | ICD-10-CM | POA: Diagnosis not present

## 2023-07-01 DIAGNOSIS — E282 Polycystic ovarian syndrome: Secondary | ICD-10-CM | POA: Diagnosis not present

## 2023-07-01 DIAGNOSIS — Z90710 Acquired absence of both cervix and uterus: Secondary | ICD-10-CM | POA: Diagnosis not present

## 2023-07-01 HISTORY — PX: LAPAROSCOPIC LYSIS OF ADHESIONS: SHX5905

## 2023-07-01 HISTORY — PX: LAPAROSCOPIC SALPINGO OOPHERECTOMY: SHX5927

## 2023-07-01 LAB — CBC
HCT: 40.5 % (ref 36.0–46.0)
Hemoglobin: 12.7 g/dL (ref 12.0–15.0)
MCH: 28 pg (ref 26.0–34.0)
MCHC: 31.4 g/dL (ref 30.0–36.0)
MCV: 89.4 fL (ref 80.0–100.0)
Platelets: 383 10*3/uL (ref 150–400)
RBC: 4.53 MIL/uL (ref 3.87–5.11)
RDW: 12.8 % (ref 11.5–15.5)
WBC: 8.5 10*3/uL (ref 4.0–10.5)
nRBC: 0 % (ref 0.0–0.2)

## 2023-07-01 LAB — TYPE AND SCREEN
ABO/RH(D): O POS
Antibody Screen: NEGATIVE

## 2023-07-01 LAB — HIV ANTIBODY (ROUTINE TESTING W REFLEX): HIV Screen 4th Generation wRfx: NONREACTIVE

## 2023-07-01 SURGERY — SALPINGO-OOPHORECTOMY, LAPAROSCOPIC
Anesthesia: General | Site: Abdomen | Laterality: Bilateral

## 2023-07-01 MED ORDER — DROPERIDOL 2.5 MG/ML IJ SOLN
INTRAMUSCULAR | Status: DC | PRN
  Administered 2023-07-01: .625 mg via INTRAVENOUS

## 2023-07-01 MED ORDER — DIPHENHYDRAMINE HCL 50 MG/ML IJ SOLN
25.0000 mg | Freq: Once | INTRAMUSCULAR | Status: AC
  Administered 2023-07-01: 25 mg via INTRAVENOUS

## 2023-07-01 MED ORDER — CEFAZOLIN SODIUM-DEXTROSE 2-4 GM/100ML-% IV SOLN
2.0000 g | INTRAVENOUS | Status: AC
  Administered 2023-07-01: 2 g via INTRAVENOUS

## 2023-07-01 MED ORDER — ONDANSETRON HCL 4 MG/2ML IJ SOLN
INTRAMUSCULAR | Status: AC
  Filled 2023-07-01: qty 2

## 2023-07-01 MED ORDER — LIDOCAINE HCL (CARDIAC) PF 100 MG/5ML IV SOSY
PREFILLED_SYRINGE | INTRAVENOUS | Status: DC | PRN
  Administered 2023-07-01: 100 mg via INTRAVENOUS

## 2023-07-01 MED ORDER — CEFAZOLIN SODIUM-DEXTROSE 2-4 GM/100ML-% IV SOLN
INTRAVENOUS | Status: AC
  Filled 2023-07-01: qty 100

## 2023-07-01 MED ORDER — BUPIVACAINE HCL (PF) 0.5 % IJ SOLN
INTRAMUSCULAR | Status: DC | PRN
  Administered 2023-07-01: 12 mL

## 2023-07-01 MED ORDER — KETOROLAC TROMETHAMINE 30 MG/ML IJ SOLN
INTRAMUSCULAR | Status: DC | PRN
  Administered 2023-07-01: 30 mg via INTRAVENOUS

## 2023-07-01 MED ORDER — STERILE WATER FOR IRRIGATION IR SOLN
Status: DC | PRN
  Administered 2023-07-01: 500 mL

## 2023-07-01 MED ORDER — FENTANYL CITRATE (PF) 100 MCG/2ML IJ SOLN
INTRAMUSCULAR | Status: AC
  Filled 2023-07-01: qty 2

## 2023-07-01 MED ORDER — ROCURONIUM BROMIDE 10 MG/ML (PF) SYRINGE
PREFILLED_SYRINGE | INTRAVENOUS | Status: AC
Start: 2023-07-01 — End: ?
  Filled 2023-07-01: qty 10

## 2023-07-01 MED ORDER — DEXMEDETOMIDINE HCL IN NACL 80 MCG/20ML IV SOLN
INTRAVENOUS | Status: DC | PRN
  Administered 2023-07-01 (×2): 4 ug via INTRAVENOUS

## 2023-07-01 MED ORDER — MIDAZOLAM HCL 2 MG/2ML IJ SOLN
INTRAMUSCULAR | Status: AC
  Filled 2023-07-01: qty 2

## 2023-07-01 MED ORDER — LIDOCAINE HCL (PF) 2 % IJ SOLN
INTRAMUSCULAR | Status: AC
  Filled 2023-07-01: qty 5

## 2023-07-01 MED ORDER — DEXAMETHASONE SODIUM PHOSPHATE 4 MG/ML IJ SOLN
INTRAMUSCULAR | Status: DC | PRN
  Administered 2023-07-01: 5 mg via INTRAVENOUS

## 2023-07-01 MED ORDER — ONDANSETRON HCL 4 MG/2ML IJ SOLN
INTRAMUSCULAR | Status: DC | PRN
  Administered 2023-07-01: 4 mg via INTRAVENOUS

## 2023-07-01 MED ORDER — SODIUM CHLORIDE 0.9 % IR SOLN
Status: DC | PRN
  Administered 2023-07-01: 1000 mL

## 2023-07-01 MED ORDER — IBUPROFEN 200 MG PO TABS: 600.0000 mg | ORAL_TABLET | Freq: Four times a day (QID) | ORAL | 0 refills | Status: DC | PRN

## 2023-07-01 MED ORDER — EPHEDRINE SULFATE (PRESSORS) 50 MG/ML IJ SOLN
INTRAMUSCULAR | Status: DC | PRN
  Administered 2023-07-01: 10 mg via INTRAVENOUS

## 2023-07-01 MED ORDER — SCOPOLAMINE 1 MG/3DAYS TD PT72
MEDICATED_PATCH | TRANSDERMAL | Status: AC
  Filled 2023-07-01: qty 1

## 2023-07-01 MED ORDER — ONDANSETRON HCL 4 MG/2ML IJ SOLN: 4.0000 mg | Freq: Once | INTRAMUSCULAR | Status: DC | PRN

## 2023-07-01 MED ORDER — EPHEDRINE 5 MG/ML INJ
INTRAVENOUS | Status: AC
Start: 2023-07-01 — End: ?
  Filled 2023-07-01: qty 5

## 2023-07-01 MED ORDER — MIDAZOLAM HCL 5 MG/5ML IJ SOLN
INTRAMUSCULAR | Status: DC | PRN
  Administered 2023-07-01: 2 mg via INTRAVENOUS
  Administered 2023-07-01 (×2): 1 mg via INTRAVENOUS

## 2023-07-01 MED ORDER — ROCURONIUM BROMIDE 100 MG/10ML IV SOLN
INTRAVENOUS | Status: DC | PRN
  Administered 2023-07-01: 60 mg via INTRAVENOUS
  Administered 2023-07-01: 10 mg via INTRAVENOUS

## 2023-07-01 MED ORDER — LACTATED RINGERS IV SOLN: INTRAVENOUS | Status: DC

## 2023-07-01 MED ORDER — FENTANYL CITRATE (PF) 100 MCG/2ML IJ SOLN
INTRAMUSCULAR | Status: DC | PRN
  Administered 2023-07-01 (×2): 50 ug via INTRAVENOUS
  Administered 2023-07-01: 100 ug via INTRAVENOUS
  Administered 2023-07-01 (×2): 50 ug via INTRAVENOUS

## 2023-07-01 MED ORDER — DEXAMETHASONE SODIUM PHOSPHATE 10 MG/ML IJ SOLN
INTRAMUSCULAR | Status: AC
Start: 2023-07-01 — End: ?
  Filled 2023-07-01: qty 1

## 2023-07-01 MED ORDER — SUGAMMADEX SODIUM 200 MG/2ML IV SOLN
INTRAVENOUS | Status: DC | PRN
  Administered 2023-07-01: 200 mg via INTRAVENOUS

## 2023-07-01 MED ORDER — FENTANYL CITRATE (PF) 100 MCG/2ML IJ SOLN
25.0000 ug | INTRAMUSCULAR | Status: DC | PRN
  Administered 2023-07-01 (×3): 50 ug via INTRAVENOUS

## 2023-07-01 MED ORDER — DIPHENHYDRAMINE HCL 50 MG/ML IJ SOLN
INTRAMUSCULAR | Status: AC
Start: 2023-07-01 — End: ?
  Filled 2023-07-01: qty 1

## 2023-07-01 MED ORDER — DROPERIDOL 2.5 MG/ML IJ SOLN
INTRAMUSCULAR | Status: AC
  Filled 2023-07-01: qty 2

## 2023-07-01 MED ORDER — POVIDONE-IODINE 10 % EX SWAB: 2.0000 | Freq: Once | CUTANEOUS | Status: DC

## 2023-07-01 MED ORDER — PROPOFOL 10 MG/ML IV BOLUS
INTRAVENOUS | Status: AC
  Filled 2023-07-01: qty 20

## 2023-07-01 MED ORDER — PROPOFOL 10 MG/ML IV BOLUS
INTRAVENOUS | Status: DC | PRN
  Administered 2023-07-01: 200 mg via INTRAVENOUS

## 2023-07-01 MED ORDER — HYDROCODONE-ACETAMINOPHEN 5-325 MG PO TABS: 1.0000 | ORAL_TABLET | ORAL | 0 refills | Status: AC | PRN

## 2023-07-01 MED ORDER — KETOROLAC TROMETHAMINE 30 MG/ML IJ SOLN: 30.0000 mg | Freq: Once | INTRAMUSCULAR | Status: DC | PRN

## 2023-07-01 MED ORDER — DEXMEDETOMIDINE HCL IN NACL 80 MCG/20ML IV SOLN
INTRAVENOUS | Status: AC
Start: 2023-07-01 — End: ?
  Filled 2023-07-01: qty 20

## 2023-07-01 MED ORDER — PROPOFOL 10 MG/ML IV BOLUS
INTRAVENOUS | Status: AC
Start: 2023-07-01 — End: ?
  Filled 2023-07-01: qty 20

## 2023-07-01 SURGICAL SUPPLY — 30 items
ADH SKN CLS APL DERMABOND .7 (GAUZE/BANDAGES/DRESSINGS) ×3
APL PRP STRL LF DISP 70% ISPRP (MISCELLANEOUS) ×3
CATH ROBINSON RED A/P 16FR (CATHETERS) ×2 IMPLANT
CHLORAPREP W/TINT 26 (MISCELLANEOUS) ×3 IMPLANT
DERMABOND ADVANCED .7 DNX12 (GAUZE/BANDAGES/DRESSINGS) ×3 IMPLANT
DRAPE SURG IRRIG POUCH 19X23 (DRAPES) ×3 IMPLANT
DRSG OPSITE POSTOP 3X4 (GAUZE/BANDAGES/DRESSINGS) IMPLANT
DRSG TELFA 3X8 NADH STRL (GAUZE/BANDAGES/DRESSINGS) ×1 IMPLANT
GLOVE BIOGEL PI IND STRL 7.0 (GLOVE) ×3 IMPLANT
GLOVE ECLIPSE 7.0 STRL STRAW (GLOVE) ×6 IMPLANT
GLOVE INDICATOR 7.5 STRL GRN (GLOVE) ×3 IMPLANT
GOWN STRL REUS W/TWL LRG LVL3 (GOWN DISPOSABLE) ×3 IMPLANT
IRRIG SUCT STRYKERFLOW 2 WTIP (MISCELLANEOUS) ×3
IRRIGATION SUCT STRKRFLW 2 WTP (MISCELLANEOUS) ×3 IMPLANT
KIT PINK PAD W/HEAD ARE REST (MISCELLANEOUS) ×3
KIT PINK PAD W/HEAD ARM REST (MISCELLANEOUS) ×3 IMPLANT
KIT TURNOVER CYSTO (KITS) ×3 IMPLANT
LIGASURE BLUNT TIP 5 LONG 44CM (ELECTROSURGICAL) ×1 IMPLANT
PACK LAPAROSCOPY BASIN (CUSTOM PROCEDURE TRAY) ×3 IMPLANT
SCISSORS LAP 5X45 EPIX DISP (ENDOMECHANICALS) IMPLANT
SET TUBE SMOKE EVAC HIGH FLOW (TUBING) ×3 IMPLANT
SLEEVE SCD COMPRESS KNEE MED (STOCKING) ×3 IMPLANT
SLEEVE Z-THREAD 5X100MM (TROCAR) ×4 IMPLANT
SUT MON AB 4-0 PS1 27 (SUTURE) ×4 IMPLANT
SUT VICRYL 0 UR6 27IN ABS (SUTURE) ×3 IMPLANT
SYSTEM CARTER THOMASON II (TROCAR) ×1 IMPLANT
TOWEL OR 17X24 6PK STRL BLUE (TOWEL DISPOSABLE) ×5 IMPLANT
TROCAR Z-THREAD FIOS 11X100 BL (TROCAR) ×3 IMPLANT
TROCAR Z-THREAD FIOS 5X100MM (TROCAR) ×3 IMPLANT
WARMER LAPAROSCOPE (MISCELLANEOUS) ×3 IMPLANT

## 2023-07-01 NOTE — Op Note (Signed)
Operative Note   PREOPERATIVE DIAGNOSES: Acute on chronic pelvic pain, endometriosis  POSTOPERATIVE DIAGNOSES: 1. Same  PROCEDURE PERFORMED: Laparoscopic bilateral oophorectomy, lysis of adhesions  SURGEON: Dr. Nilda Simmer ASSISTANT: Dr. Jule Economy   ANESTHESIA: General   ESTIMATED BLOOD LOSS: minimal   URINE OUTPUT: Clear urine at the end of the procedure.  FLUIDS: Per anesthesia  COMPLICATIONS: None   TUBES: None.  DRAINS: None  PATHOLOGY: Bilateral ovaries, omental biopsy  FINDINGS: On exam, under anesthesia, s/p hysterectomy.  LUQ entry done and no significant bowel adhesions, however omentum with filmy white covering at the level of hernia mesh.    Some adhesive disease involving the right ovary either from endometriosis or following hysterectomy.  Normal appearing ovaries with follicular cysts, possible endometriosis implants.   Procedure: A general anesthesia was induced and the patient was placed in the dirsal lithotomy position. The abdomen, perineum, and vagina were prepped and draped in the usual fashion. A foley catheter inserted into the bladder and attached to straight drainage.   The abdomen was prepped in standard fashion.  Anesthesia confirmed that OG tube had stomach decompressed. Palmer's point was located in LUQ and a 5 mm trocar was introduced into intraperitoneal space with direct visualization.  Insufflation confirmed entry and the abdomen was insufflated and above findings were noted.  Many pictures were taken.  A 10 mm port was placed in the RLQ under direct visualization and a 5 mm port was placed similarly in the LLQ.  The right ovary was dissected with Ligasure to free from the pelvic sidewall. The ureter was visualized and away from surgical site.  The infundibular ligament was identified and ligated with Ligasure.  Hemostasis was noted.  The same process was repeated on patient left.  Both ovaries were normal appearing with follicular cysts and  possible endometriosis. The endoscopic bag was introduced and both ovaries placed into bag intact and removed through this site.   Blunt graspers were used to sample tissue on omentum, which was filmy, white, no blood supply. No bleeding noted.   Carter-Thompson was used to close fascia of the RLQ port site without issue.  The ports were removed following exsufflation and confirmation of hemostasis in the pelvis.  A Foley catheter was then removed after noting clear urine. Skin closed with 4'0 Monocryl. The sponge and lap counts were correct times 2 at this time.   The patient's procedure was terminated. We then awakened her. She was sent to the Recovery Room in good condition.    Nilda Simmer MD

## 2023-07-01 NOTE — Anesthesia Procedure Notes (Signed)
Procedure Name: Intubation Date/Time: 07/01/2023 7:48 AM  Performed by: Jessica Priest, CRNAPre-anesthesia Checklist: Patient identified, Emergency Drugs available, Suction available, Patient being monitored and Timeout performed Patient Re-evaluated:Patient Re-evaluated prior to induction Oxygen Delivery Method: Circle system utilized Preoxygenation: Pre-oxygenation with 100% oxygen Induction Type: IV induction Ventilation: Mask ventilation without difficulty Laryngoscope Size: Mac and 3 Grade View: Grade II Tube type: Oral Tube size: 7.0 mm Number of attempts: 1 Airway Equipment and Method: Stylet and Oral airway Placement Confirmation: ETT inserted through vocal cords under direct vision, positive ETCO2, breath sounds checked- equal and bilateral and CO2 detector Secured at: 23 cm Tube secured with: Tape Dental Injury: Teeth and Oropharynx as per pre-operative assessment

## 2023-07-01 NOTE — Plan of Care (Signed)
 [facility] Tonsillectomy/Adenoidectomy, Postoperative PEDS care plan entered in error.

## 2023-07-01 NOTE — Transfer of Care (Signed)
Immediate Anesthesia Transfer of Care Note  Patient: Stacy Deleon  Procedure(s) Performed: Procedure(s) (LRB): LAPAROSCOPIC BILATERAL OOPHORECTOMY (Bilateral) LAPAROSCOPIC LYSIS OF ADHESIONS  Patient Location: PACU  Anesthesia Type: GA  Level of Consciousness: awake, sedated, patient cooperative and responds to stimulation  Airway & Oxygen Therapy: Patient Spontanous Breathing and Patient connected to  oxygen  Post-op Assessment: Report given to PACU RN, Post -op Vital signs reviewed and stable and Patient moving all extremities  Post vital signs: Reviewed and stable  Complications: No apparent anesthesia complications

## 2023-07-01 NOTE — Anesthesia Preprocedure Evaluation (Signed)
Anesthesia Evaluation  Patient identified by MRN, date of birth, ID band Patient awake    Reviewed: Allergy & Precautions, H&P , NPO status , Patient's Chart, lab work & pertinent test results  History of Anesthesia Complications (+) PONV and history of anesthetic complications  Airway Mallampati: II  TM Distance: >3 FB Neck ROM: Full    Dental no notable dental hx.    Pulmonary neg pulmonary ROS, former smoker   Pulmonary exam normal breath sounds clear to auscultation       Cardiovascular negative cardio ROS Normal cardiovascular exam Rhythm:Regular Rate:Normal     Neuro/Psych negative neurological ROS  negative psych ROS   GI/Hepatic Neg liver ROS,GERD  ,,  Endo/Other  negative endocrine ROS    Renal/GU negative Renal ROS  negative genitourinary   Musculoskeletal negative musculoskeletal ROS (+)    Abdominal   Peds negative pediatric ROS (+)  Hematology negative hematology ROS (+)   Anesthesia Other Findings   Reproductive/Obstetrics negative OB ROS                             Anesthesia Physical Anesthesia Plan  ASA: 2  Anesthesia Plan: General   Post-op Pain Management: Tylenol PO (pre-op)*   Induction: Intravenous  PONV Risk Score and Plan: 4 or greater and Ondansetron, Droperidol, Dexamethasone, Midazolam and Treatment may vary due to age or medical condition  Airway Management Planned: Oral ETT  Additional Equipment:   Intra-op Plan:   Post-operative Plan: Extubation in OR  Informed Consent: I have reviewed the patients History and Physical, chart, labs and discussed the procedure including the risks, benefits and alternatives for the proposed anesthesia with the patient or authorized representative who has indicated his/her understanding and acceptance.     Dental advisory given  Plan Discussed with: CRNA and Surgeon  Anesthesia Plan Comments:         Anesthesia Quick Evaluation

## 2023-07-01 NOTE — Discharge Instructions (Signed)
   No ibuprofen, Advil, Aleve, Motrin, ketorolac, meloxicam, naproxen, or other NSAIDS until after 2:51 pm today if needed.      Post Anesthesia Home Care Instructions  Activity: Get plenty of rest for the remainder of the day. A responsible individual must stay with you for 24 hours following the procedure.  For the next 24 hours, DO NOT: -Drive a car -Advertising copywriter -Drink alcoholic beverages -Take any medication unless instructed by your physician -Make any legal decisions or sign important papers.  Meals: Start with liquid foods such as gelatin or soup. Progress to regular foods as tolerated. Avoid greasy, spicy, heavy foods. If nausea and/or vomiting occur, drink only clear liquids until the nausea and/or vomiting subsides. Call your physician if vomiting continues.  Special Instructions/Symptoms: Your throat may feel dry or sore from the anesthesia or the breathing tube placed in your throat during surgery. If this causes discomfort, gargle with warm salt water. The discomfort should disappear within 24 hours.  If you had a scopolamine patch placed behind your ear for the management of post- operative nausea and/or vomiting:  1. The medication in the patch is effective for 72 hours, after which it should be removed.  Wrap patch in a tissue and discard in the trash. Wash hands thoroughly with soap and water. 2. You may remove the patch earlier than 72 hours if you experience unpleasant side effects which may include dry mouth, dizziness or visual disturbances. 3. Avoid touching the patch. Wash your hands with soap and water after contact with the patch.

## 2023-07-01 NOTE — Progress Notes (Signed)
No changes to above H&P  BP 118/65   Pulse 86   Temp 98.2 F (36.8 C) (Oral)   Resp 17   Ht 5\' 5"  (1.651 m)   Wt 108.2 kg   LMP 04/18/2017   SpO2 99%   BMI 39.71 kg/m   Procedure and risks again reviewed in detail.  Will proceed as planned.   Nilda Simmer MD

## 2023-07-01 NOTE — Anesthesia Postprocedure Evaluation (Signed)
Anesthesia Post Note  Patient: Stacy Deleon  Procedure(s) Performed: LAPAROSCOPIC BILATERAL OOPHORECTOMY (Bilateral: Abdomen) LAPAROSCOPIC LYSIS OF ADHESIONS (Abdomen)     Patient location during evaluation: PACU Anesthesia Type: General Level of consciousness: awake and alert Pain management: pain level controlled Vital Signs Assessment: post-procedure vital signs reviewed and stable Respiratory status: spontaneous breathing, nonlabored ventilation, respiratory function stable and patient connected to nasal cannula oxygen Cardiovascular status: blood pressure returned to baseline and stable Postop Assessment: no apparent nausea or vomiting Anesthetic complications: no  No notable events documented.  Last Vitals:  Vitals:   07/01/23 0905 07/01/23 0915  BP:  125/74  Pulse: 94 78  Resp: 16 12  Temp: 36.5 C   SpO2: 100% 97%    Last Pain:  Vitals:   07/01/23 0905  TempSrc:   PainSc: 10-Worst pain ever                 [name] S

## 2023-07-03 ENCOUNTER — Encounter (HOSPITAL_BASED_OUTPATIENT_CLINIC_OR_DEPARTMENT_OTHER): Payer: Self-pay | Admitting: Obstetrics and Gynecology

## 2023-07-03 LAB — SURGICAL PATHOLOGY

## 2023-09-18 ENCOUNTER — Other Ambulatory Visit (HOSPITAL_COMMUNITY): Payer: Self-pay

## 2023-09-18 ENCOUNTER — Telehealth: Payer: Self-pay

## 2023-09-18 NOTE — Telephone Encounter (Signed)
Rx pa team: Are you needing [place] to find out what their other insurance is so you can reprocess pa thru that? Thanks,  Production assistant, radio

## 2023-09-18 NOTE — Telephone Encounter (Signed)
*  [facility]  Pharmacy Patient Advocate Encounter  Received notification from [facility] that Prior Authorization for Emgality 100MG /ML syringes (episodic cluster headache)  has been CANCELLED due to [employer] Medicaid shows as not the patients primary plan.   PA #/Case ID/Reference #: [id number]

## 2023-09-19 ENCOUNTER — Other Ambulatory Visit (HOSPITAL_COMMUNITY): Payer: Self-pay

## 2023-09-19 NOTE — Telephone Encounter (Signed)
We are unable to complete a PA as requested due to only having a secondary plans information. Patient will have to provide the Primary insurance pharmacy benefits or contact the plan if they are supposed to only have the Medicaid plan.

## 2023-09-22 NOTE — Telephone Encounter (Signed)
Pt will be uploading insurance info via [employer]

## 2023-09-22 NOTE — Telephone Encounter (Signed)
Spoke to pt and she will be uploading ins info to her mychart. Pt also had to r/s her f/u appt but nothing was available until May.

## 2023-09-22 NOTE — Telephone Encounter (Signed)
Phone room: Can you have patient bring all forms of insurance by the office or send by mychart? Thanks,  Production assistant, radio

## 2023-09-26 NOTE — Telephone Encounter (Signed)
Still no new insurance card received. Closing request.

## 2023-10-01 ENCOUNTER — Ambulatory Visit: Payer: Medicaid Other | Admitting: Neurology

## 2023-12-29 NOTE — Consults (Signed)
 ------------------------------------------------------------------------------- Attestation signed by Ozell Fam, MD at 12/31/2023  9:45 PM I reviewed the patient and I discussed the patient's history, findings, and management with the Fellow. I reviewed and agree with the Fellow's note.  Electronically Signed by: Ozell Fam, MD, Attending Physician 12/31/2023 9:45 PM  -------------------------------------------------------------------------------  TELESTROKE EMERGENT CONSULTATION  Neurology Teleconsult- Stroke 12/29/2023   Location Information: Patient State (at time of visit): [place]  Patient Location (at time of visit):Medical Facility: [facility] Provider Location: Hospital/Provider-Based Clinic Is provider licensed to provide clinical care in the current location/state of the patient? Yes   Consent:  Patient's identity was confirmed. Presenting condition or illness was discussed with the patient/personal representative. Current proposed treatment for presenting condition or illness was explained to patient/personal representative along with the likely benefits and any significant risks or complications associated with the provision of treatment by audio/video means. The patient/personal representative verbally authorized treatment to be provided by audio/video, which may include a limited review of patient's current health status, medication, or other treatment recommendations, patient education, and an opportunity to ask questions about condition and treatment. Verbal Consent Granted by Patient/Personal Representative:Yes   Visit Information: Modality: 2-Way Real-Time Audio/Video  Video Start Time: 2:43 Video Stop Time: 2:56 Video Total Time: 13 min Teleconsult was initiated by the attending physician on behalf of the patient.  CC: Stroke like symptoms  HPI: Stacy Deleon is a 43 y.o. female  Time at Presentation:  12/29/2023    HPI: Stacy Deleon is a 43 y.o.  female presenting with history of migraine, reported prior optic neuritis in the left, mood disorder, obesity, PCOS who presented to [facility] on 12/29/2023 with a chief complaint of left hemibody weakness, left hemibody sensory changes, headache.  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  She was last known well at 11 AM.  She then noted left hemibody weakness and sensory changes prompting her presentation to the emergency department.  In the ED BP 122/73, afebrile, and saturating well on room air.  On physical exam, she has mild drift in the left upper and lower extremity and diminished sensation to light touch in the left hemibody.  Face is symmetric.  There is no dysarthria or aphasia.  In total NIHSS 3.      Last Seen Normal 11 AM Imaging:  CT head visualized by me: Yes 2:50 pm CT head read by radiology -radiology read pending  Home Medications: Denies any anticoagulation or antiplatelet agents    Physical Exam limited via telemedicine BP 122/73 (BP Location: Left arm, Patient Position: Sitting)   Pulse 79   Temp 98.4 F (36.9 C) (Oral)   Resp 16   Wt 115 kg (254 lb)   SpO2 100%   BMI 42.27 kg/m    BP:   NIHSS:  LOC 0 = Alert keenly responsive 1 = Not Alert but arousable by minor stimulation to obey, answer, respond 2 = Not Alert; requires repeat stimulation, obtunded, requires strong stimuli 3 = Reflex motor or autonomic effects response, totally unresponsive, flaccid 0  LOC Questions 0 = Answers both questions correctly 1 = Answers one question correctly 2 = Answers neither questions correctly 0  LOC Commands 0 = Performs both tasks correctly 1 = Performs one task correctly 2 = Performs neither task correctly 0  Best Gaze 0 = Normal 1 = Partial Gaze Palsy or preference 2 =  Forced deviation 0  Visual Fields 0 = No visual loss 1 = Partial  hemianopia 2 = Complete hemianopia 3 = Bilateral hemianopia (including cortical blindness). 0 [L eye baseline visual impairment]  Facial Palsy 0 = Normal symmetrical movement 1 = Minor paralysis (flattened nasolabial fold, asymmetry on smiling) 2 = Partial paralysis (total or near total paralysis of lower face) 3 = Complete paralysis of one or both sides (no upper/lower face mvmt). 0  Motor Arm R 0 = No drift 1 = Drift 2 = Some effort against gravity 3 = No effort against gravity, limb falls 4 = No movement 0  Motor Leg R 0 = No drift; leg holds 30 for full 5 seconds 1 = Drift, leg falls but does not hit bed 2 = Some effort against gravity, falls to bed w/in 5 sec 3 = No effort agains 0  Motor Arm L 0 = No drift 1 = Drift 2 = Some effort against gravity 3 = No effort against gravity, limb falls 4 = No movement 1  Motor Leg L 0 = No drift; leg holds 30 for full 5 seconds 1 = Drift, leg falls but does not hit bed 2 = Some effort against gravity, falls to bed w/in 5 sec 3 = No effort agains 1  Limb Ataxia 0 = Absent 1 = Present in one limb 2 = Present in two limbs 0  Sensory 0 = Normal, no sensory loss 1 = Mild/moderate sensory loss; may be dulled/"Not as sharp" 2 = Severe/total sensory loss; not aware of face/arm/leg being touched 1  Best Language 0 = No aphasia, normal 1 = Mild / moderate aphasia; some loss of fluency / comprehension 2 = Severe aphasia; (cannot identify pictures) 3 = Mute; global aphasia; no usable speech; or auditory comprehension 0  Dysarthria 0 = Normal articulation 1 = Mild / Moderate; slurs some words; understood w/some difficulty. 2 = Severe, so slurred as to be unintelligible; mute/anarthric 0  Extinction/ Inattention 0 = No abnormality 1 = Visual, tactile, auditory, spatial or personal inattention or extinction  2 = Profound hemi-inattention or inattention to more than one modality 0                                                                                                                                      NIHSS TOTAL:   3    Pre-morbid mRS: 0   Assessment/ Plan   43 y.o.-year-old female with medical history of migraine headaches, obesity, reported prior left-sided optic neuritis on whom telestroke was activated due to left main body weakness, left hemibody sensory changes.  Although patient arrives within the window for IV thrombolytics consideration, stroke symptoms are relatively mild as NIHSS 3 in total as above.  Furthermore, she has current pulsatile migrainous headache associated with significant nausea, consistent with prior migraine headaches.  Although unable to definitively rule out acute ischemic stroke  at this point, clinical suspicion is significantly higher for migraine with aura.  Given mild deficits, would not recommend consideration of IV thrombolytics.  Rather, would administer migraine cocktail and assess for improvement in symptoms.  Out of an abundance of caution, could also administer full dose aspirin  and admit for MRI brain given that this is reportedly her first presentation for the symptoms.  Plan: - Migraine cocktail:  - 1 g Tylenol  p.o.  - 5 mg Compazine  IV  - 25 mg Benadryl  IV  - 2 g magnesium  IV  - (+/-) 500 cc IV fluid bolus - Reasonable to administer aspirin  325 x 1 and admit for MRI brain to more definitively rule out acute ischemic stroke if patient is agreeable    Thrombolytics recommended: No  Thrombolytic not administered due to: Minor nondisabling symptoms  Thrombolytic administration delayed due to: N/A     I have personally completed the following: rounded with the primary team, seen and examined the patient, and reviewed the x-rays and available reports.  This is my note.  I have personally spent 30 minutes involved in face-to-face and non-face-to-face activities for this patient on the day of the visit.  Professional time spent includes the following activities, in addition to  those noted in the documentation: telephone consultation with emergency provider   Electronically Signed by: Worth Lynwood Ruth, MD,Physician 12/29/2023 2:56 PM    Thrombolytic exclusion tPA/ TNK not administered due to: Minor non disabling deficits

## 2023-12-29 NOTE — ED Provider Notes (Signed)
 [facility] Emergency Department Emergency Department Provider Note   Provider at bedside: 12/29/2023 2:07 PM  Chief Complaint:  Left sided weakness  History of Present Illness:  History obtained from: patient, EMS.    Stacy Deleon is a 43 y.o. female with PMHx of chronic cluster headaches, optic neuritis, anxiety, migraines who presents to the ED via EMS with stoke like symptoms. Patient reports she was eating lunch at 1:10 PM, talking on the phone with an employee started having tingling in her feet, left greater than right,  weakness in her left side of face, left arm and left leg.   EMS reports patient passed out while sitting, when she woke up she felt dizzy and thought her sugar maybe low. She was given some chocolate to eat .   She denies recent injuries or traumas.    ______________________  ROS: Pertinent positives and negatives per HPI. Pertinent past medical, surgical, social and family history records were reviewed. Current Medications and Allergies were reviewed.  Physical Exam   Vitals:   12/29/23 1441 12/29/23 1445 12/29/23 1545 12/29/23 1549  BP: 122/73 111/78  (!) 144/98  BP Location: Left arm     Patient Position: Sitting     Pulse: 79 83 (!) 116 105  Resp: 16 15 (!) 25 19  Temp: 98.4 F (36.9 C)     TempSrc: Oral     SpO2: 100% 98% 99% 98%  Weight: 115 kg (254 lb)         Physical Exam Vitals and nursing note reviewed.  Constitutional:      General: She is not in acute distress.    Appearance: Normal appearance.  HENT:     Head: Normocephalic and atraumatic.     Right Ear: External ear normal.     Left Ear: External ear normal.     Nose: Nose normal.     Mouth/Throat:     Mouth: Mucous membranes are moist.  Eyes:     Pupils: Pupils are equal, round, and reactive to light.  Cardiovascular:     Rate and Rhythm: Normal rate and regular rhythm.     Heart sounds: Normal heart sounds.  Pulmonary:     Effort: Pulmonary effort  is normal.     Breath sounds: Normal breath sounds.  Abdominal:     Palpations: Abdomen is soft.     Tenderness: There is no abdominal tenderness.  Musculoskeletal:        General: Normal range of motion.     Cervical back: Normal range of motion.  Skin:    General: Skin is warm.  Neurological:     General: No focal deficit present.     Mental Status: She is alert and oriented to person, place, and time.     Sensory: Sensory deficit present.     Motor: Weakness present.     Comments: Subjective decreased sensation left side of face, left arm, left leg. Weakness in left arm and left leg when compared to right.  Psychiatric:        Mood and Affect: Mood normal.        Speech: Speech is slurred (mildly).        Behavior: Behavior normal.        Thought Content: Thought content normal.     Results   EKG Impression:   My Interpretation: EKG For acute MI, arrhythmia, conduction abnormality, ischemia, electrolyte abnormality was performed and showed EKG time: 1442 Interpretation time: 4:02 PM  Rate: 73 Rhythm: normal sinus rhythm Axis: Normal Intervals: Normal ST-T Waves: Nonspecific T wave abnormality Comparison with Old:  Yes -no significant changes compared to October 21, 2022.  Labs:  Lab Results (last 24 hours)     ** No results found for the last 24 hours. **      Labs Reviewed  COMPREHENSIVE METABOLIC PANEL - Abnormal      Result Value   Sodium 134 (*)    Potassium 3.8     Chloride 101     CO2 26     Anion Gap 7     Glucose, Random 91     Blood Urea Nitrogen (BUN) 13     Creatinine 0.74     eGFR >90     Albumin 3.9     Total Protein 6.9     Bilirubin, Total 0.3     Alkaline Phosphatase (ALP) 133 (*)    Aspartate Aminotransferase (AST) 19     Alanine Aminotransferase (ALT) 16     Calcium 8.9     BUN/Creatinine Ratio      CBC WITH DIFFERENTIAL - Abnormal   WBC 9.48     RBC 4.62     Hemoglobin 13.1     Hematocrit 38.9     Mean Corpuscular Volume  (MCV) 84.0     Mean Corpuscular Hemoglobin (MCH) 28.3     Mean Corpuscular Hemoglobin Conc (MCHC) 33.7     Red Cell Distribution Width (RDW) 13.6     Platelet Count (PLT) 376     Mean Platelet Volume (MPV) 6.8     Neutrophils % 56     Lymphocytes % 27     Monocytes % 10     Eosinophils % 6     Basophils % 1     Neutrophils Absolute 5.30     Lymphocytes # 2.50     Monocytes # 0.90 (*)    Eosinophils # 0.60 (*)    Basophils # 0.10    HEMOGLOBIN A1C  WITH ESTIMATED AVERAGE GLUCOSE - Abnormal   Hemoglobin A1c 5.8 (*)    Estimated Average Glucose 120    LIPID PANEL - Abnormal   Cholesterol, Total, Lipid Panel 231 (*)    Triglycerides, Lipid Panel 146     HDL Cholesterol - Lipid Panel 57 (*)    LDL Cholesterol, Calculated 146 (*)    Non-HDL Cholesterol 174    PROTHROMBIN TIME (PT) WITH INR - Normal   Prothrombin Time (PT) 12.4     International Normalized Ratio (INR) 0.9    TROPONIN, HIGH SENSITIVE X 2 (0 HOUR BASELINE + 2 HOUR) - Normal   Troponin, High Sensitive <3    URINALYSIS WITH REFLEX TO MICROSCOPIC - Normal   Color, Urine Colorless     Clarity, Urine Clear     Specific Gravity, Urine 1.016     pH, Urine 6.5     Protein, Urine Negative     Glucose, Urine Negative     Ketones, Urine Negative     Bilirubin, Urine Negative     Blood, Urine Negative     Nitrite, Urine Negative     Leukocyte Esterase, Urine Negative     Urobilinogen, Urine Normal     Narrative:    Microscopic not indicated  TSH - Normal   TSH 0.763     Narrative:    Female Patients >12 years only: . Pregnant Females, 1st Trimester: 0.050 - 3.700 IU/mL . Pregnant Females, 2nd  Trimester: 0.310 - 4.350 IU/mL . Pregnant Females, 3rd Trimester: 0.410 - 5.180 IU/mL    My interpretation of patient's labs shows that her cholesterol was high, but her CBC and CMP were essentially normal PT/INR was normal and initial troponin to rule out ischemia was negative.  CXR Impression:  (Interpreted by me) My  Interpretation: Chest xray for pneumonia, pneumothorax, pleural effusion, pulmonary edema, cardiomegaly, aortic abnormality was performed and did not show any acute cardiopulmonary findings.  Impression of additional imaging studies include: CT code stroke was performed and showed IMPRESSION: No CT evidence of acute intracranial abnormality.  ASPECTS is 10.   No large vessel occlusion. No lesion amenable to neurovascular intervention. Somewhat limited evaluation of distal cerebral artery branches due to contrast bolus timing.   No core infarct identified on CT perfusion.  Imaging:  Radiology Results (last 72 hours)     Procedure Component Value Units Date/Time   XR Chest 1 View [[id number]] Collected: 12/29/23 1618   Order Status: Completed Updated: 12/29/23 1621   Narrative:     CLINICAL DATA:  Stroke.  EXAM: CHEST  1 VIEW  COMPARISON:  None Available.  FINDINGS: The heart size and mediastinal contours are within normal limits. Both lungs are clear. The visualized skeletal structures are unremarkable.    Impression:     No active disease.   Electronically Signed   By: Vanetta Chou M.D.   On: 12/29/2023 16:19    CT Code Stroke W Perfusion [[id number]] Collected: 12/29/23 1426   Order Status: Completed Updated: 12/29/23 1454   Narrative:     CLINICAL DATA:  Neuro deficit, concern for stroke, syncopal episode, left-sided deficits.  EXAM: CT HEAD WITHOUT CONTRAST  CT ANGIOGRAPHY HEAD AND NECK  CT PERFUSION BRAIN  TECHNIQUE: Multidetector CT imaging of the head and neck was performed using the standard protocol during bolus administration of intravenous contrast. Multiplanar CT image reconstructions and MIPs were obtained to evaluate the vascular anatomy. Carotid stenosis measurements (when applicable) are obtained utilizing NASCET criteria, using the distal internal carotid diameter as the denominator.  Multiphase CT imaging of the brain was performed  following IV bolus contrast injection. Subsequent parametric perfusion maps were calculated using RAPID software.  RADIATION DOSE REDUCTION: This exam was performed according to the departmental dose-optimization program which includes automated exposure control, adjustment of the mA and/or kV according to patient size and/or use of iterative reconstruction technique.  COMPARISON:  None Available.  FINDINGS: CT HEAD FINDINGS  Brain: No acute intracranial hemorrhage. No CT evidence of acute infarct. No edema, mass effect, or midline shift. The basilar cisterns are patent.  Ventricles: The ventricles are normal.  Vascular: No hyperdense vessel or unexpected calcification.  Skull: No acute or aggressive finding.  Orbits: Orbits are symmetric.  Sinuses: Mild mucosal thickening in the right ethmoid sinus.  Other: Mastoid air cells are clear.  ASPECTS (Alberta Stroke Program Early CT Score)  - Ganglionic level infarction (caudate, lentiform nuclei, internal capsule, insula, M1-M3 cortex): 7  - Supraganglionic infarction (M4-M6 cortex): 3  Total score (0-10 with 10 being normal): 10  Review of the MIP images confirms the above findings  CTA NECK FINDINGS  Aortic arch: Four vessel configuration of the aortic arch. Imaged portion shows no evidence of aneurysm or dissection. No significant stenosis of the major arch vessel origins.  Pulmonary arteries: As permitted by contrast timing, there are no filling defects in the visualized pulmonary arteries.  Subclavian arteries: The subclavian arteries are patent bilaterally.  Right  carotid system: No evidence of dissection, stenosis (50% or greater), or occlusion.  Left carotid system: No evidence of dissection, stenosis (50% or greater), or occlusion.  Vertebral arteries: Left vertebral artery is dominant. No evidence of dissection, stenosis (50% or greater), or occlusion.  Skeleton: No acute or aggressive finding  noted.  Other neck: The visualized airway is patent. No cervical lymphadenopathy. 1.1 cm nodule in the left thyroid lobe.  Upper chest: Visualized lung apices are clear.  Review of the MIP images confirms the above findings  CTA HEAD FINDINGS  ANTERIOR CIRCULATION: The intracranial ICAs are patent bilaterally. No significant stenosis, proximal occlusion, aneurysm, or vascular malformation.  MCAs: The M1 segments and MCA bifurcations are patent bilaterally. There is slightly limited evaluation of distal MCA branches due to contrast bolus timing.  ACAs: The A1 and A2 segments are patent bilaterally. Limited evaluation of distal branches due to contrast bolus timing.  POSTERIOR CIRCULATION: No significant stenosis, proximal occlusion, aneurysm, or vascular malformation.  PCAs: Patent proximally. Somewhat limited evaluation of distal branches due to contrast bolus timing.  Pcomm: The posterior communicating arteries are visualized bilaterally.  SCAs: The superior cerebellar arteries are patent bilaterally.  Basilar artery: Patent  AICAs: Not well visualized  PICAs: Patent  Vertebral arteries: The intracranial vertebral arteries are patent.  Venous sinuses: As permitted by contrast timing, patent.  Anatomic variants: None  Review of the MIP images confirms the above findings  CT Brain Perfusion Findings:  CBF (<30%) Volume: 0mL  Perfusion (Tmax>6.0s) volume: 0mL  Mismatch Volume: 0mL  Infarction Location:None identified    Impression:     No CT evidence of acute intracranial abnormality.  ASPECTS is 10.  No large vessel occlusion. No lesion amenable to neurovascular intervention. Somewhat limited evaluation of distal cerebral artery branches due to contrast bolus timing.  No core infarct identified on CT perfusion.  Noncontrast head CT results were called by telephone at the time of interpretation on 12/29/2023 at 2:16 pm to provider [facility] , who  verbally acknowledged these results.   Electronically Signed   By: Donnice Mania M.D.   On: 12/29/2023 14:52         Procedures   Procedures  Evidence Based Calculators      ED Course   ED Course as of 12/30/23 1602  Mon Dec 29, 2023  1417 Case discussed with Mania, MD with Neuroradiology who reviewed the CT and advised no acute findings. [RI]  1438 Case discussed with Jama, MD with Teleneurology who will consult with patient.  [RI]  1515 Patient was evaluated by Dr. Jama from telestroke and he suggested that we treat her as if it could be a complex migraine, he was not convinced that it was an acute stroke and did not feel patient met any criteria for TNK.  Patient is going to be given aspirin  but he also suggested that we give her a migraine cocktail, and have Dr. Overton from neurology see the patient and go ahead and get the MRI to rule out stroke. [LT]  1516 Disposition - admit [LT]  1556 Patient was given the GI cocktail and she does feel better, but she says she is extremely anxious and she is too anxious to undergo an MRI, and if she cannot do an MRI and complete her stroke workup then she just wants to be signed out AGAINST MEDICAL ADVICE and let us  see other patients.  I tried to get the patient to stay to do the full stroke  workup but she refused.  I told her that if she did have a stroke she could have permanent damage permanent disability and even lead to cardiac or respiratory arrest and she said she understood and she still wants to sign out AMA. [LT]    ED Course User Index [LT] Rock Dannielle Birmingham, MD [RI] Asberry Opal, Scribe    Medical Decision Making   External records were reviewed:  Neurology office visit dated 03/26/23 for chronic cluster headaches.  _________________________  Stacy Deleon is a 43 y.o. femalewho presents to the ED via EMS with stoke like symptoms. Patient reports she was eating lunch at 1:10 PM, talking on the phone with an employee started  having tingling in her feet, left greater than right,  weakness in her left side of face, left arm and left leg.   EMS reports patient passed out while sitting, when she woke up she felt dizzy and thought her sugar maybe low. She was given some chocolate to eat . On my initial evaluation, patient still complained of the left-sided tingling and numbness down her left face left arm and left leg.  The following differentials were considered:  DDX: CVA, TIA, migraine, tension headache, metabolic disorder, SAH, tumor, MS, sinusitis     Pertinent studies were obtained, with results listed in chart above.   results interpreted as above.  Based on ED workup, findings are consistent with left-sided weakness, left-sided numbness, slurred speech, history of migraine.    Patient received a migraine cocktail consistent with dexamethasone  Toradol  and Compazine  as well as she was given aspirin  just in case it was truly a stroke.  On reevaluation, symptoms are improved.  Clinical Assessment/Plan:  Patient presented to the ER and was called as a tier 1 code stroke for acute onset of left-sided facial numbness left arm and left leg numbness.  Patient did not have any weakness.  Patient was evaluated by telestroke and he felt that the patient possibly could just have a complex migraine even though she did not have a headache, but he was not convinced that she had had a CVA.  He suggested that we give her a migraine cocktail and after we gave her the migraine cocktail her symptoms did improve.  He also suggested that we go ahead and admit the patient for MRI for further workup for her stroke or possible stroke, and patient refused to stay and even though we explained to her that if she had a stroke she could go home and have worsening symptoms and that could lead to respiratory cardiac arrest and she could die from that, patient decided she wanted to sign out AMA anyway.  Discussion of management or test  interpretation with external provider(s):  Dr. Jama from telestroke was consulted   Clinical Complexity/Risk   Patient's presentation is most consistent with acute presentation with potential threat to life or bodily function.  Patient's impaired access to primary care increases the complexity of managing their  presentation with strokelike symptoms.    Provider time spent in patient care today, inclusive of but not limited to clinical reassessment, review of diagnostic studies, and discharge preparation, was greater than 30 minutes.  OTC medications: no, patient signed out AGAINST MEDICAL ADVICE Prescription medications discussed: no, patient signed out AGAINST MEDICAL ADVICE  ED Clinical Impression   Diagnoses that have been ruled out:  None  Diagnoses that are still under consideration:  None  Final diagnoses:  Left-sided weakness  Left sided numbness  Slurred  speech  History of migraine    ED Assessment/Plan   ED Disposition     ED Disposition  AMA   Condition  --   Comment  Primary Provider Group: Yes [1] Provider Group:: [facility] Hospitalist Team [facility]) [[id number]] Certification: I certify that inpatient services are medically necessary for this patient for a duration of greater than two midnights. See H&P and MD Progre ss Notes for additional information about the patient's course of treatment.          DISCHARGE MEDICATIONS    Medication List     ASK your doctor about these medications    escitalopram  20 mg tablet Commonly known as: LEXAPRO  Take 20 mg by mouth every morning.   estradioL  2 mg tablet Commonly known as: ESTRACE  Take 2 mg by mouth daily.   metFORMIN  500 mg tablet Commonly known as: GLUCOPHAGE  Take 1,000 mg by mouth nightly.        FOLLOW UP  No follow-up provider specified.  ____________________________ Scribe's Attestation: This document serves as a record of services personally performed by Rock Birmingham, MD. It was created  on their behalf by Rock Dannielle Birmingham, MD, a trained medical scribe. The creation of this record is the provider's dictation and/or activities during the visit.   Electronically signed by: Rock Dannielle Birmingham, MD 12/29/2023 2:07 PM

## 2023-12-29 NOTE — ED Notes (Signed)
 Pt refusing MRI. Pt states she is feeling very anxious    August N Ehrman, RN 12/29/23 1551

## 2023-12-29 NOTE — ED Notes (Signed)
 Pt adamant about wanting to leave.  IV's removed, pt e-signed AMA docutmentation.    Heather Ronnald Saba, RN 12/29/23 [phone]

## 2024-01-06 ENCOUNTER — Encounter: Payer: Self-pay | Admitting: Neurology

## 2024-01-06 ENCOUNTER — Ambulatory Visit: Payer: Medicaid Other | Admitting: Neurology

## 2024-05-28 ENCOUNTER — Emergency Department (HOSPITAL_COMMUNITY)

## 2024-05-28 ENCOUNTER — Emergency Department (HOSPITAL_COMMUNITY)
Admission: EM | Admit: 2024-05-28 | Discharge: 2024-05-28 | Disposition: A | Discharge status: Home / Self Care | Attending: Emergency Medicine | Admitting: Emergency Medicine

## 2024-05-28 ENCOUNTER — Other Ambulatory Visit: Payer: Self-pay

## 2024-05-28 ENCOUNTER — Encounter (HOSPITAL_COMMUNITY): Payer: Self-pay | Admitting: Emergency Medicine

## 2024-05-28 DIAGNOSIS — W01198A Fall on same level from slipping, tripping and stumbling with subsequent striking against other object, initial encounter: Secondary | ICD-10-CM | POA: Diagnosis not present

## 2024-05-28 DIAGNOSIS — S93402A Sprain of unspecified ligament of left ankle, initial encounter: Secondary | ICD-10-CM | POA: Insufficient documentation

## 2024-05-28 DIAGNOSIS — Z7984 Long term (current) use of oral hypoglycemic drugs: Secondary | ICD-10-CM | POA: Insufficient documentation

## 2024-05-28 DIAGNOSIS — S99912A Unspecified injury of left ankle, initial encounter: Secondary | ICD-10-CM | POA: Diagnosis present

## 2024-05-28 MED ORDER — HYDROCODONE-ACETAMINOPHEN 5-325 MG PO TABS: 1.0000 | ORAL_TABLET | Freq: Four times a day (QID) | ORAL | 0 refills | Status: AC | PRN

## 2024-05-28 MED ORDER — HYDROXYZINE HCL 25 MG PO TABS
25.0000 mg | ORAL_TABLET | Freq: Once | ORAL | Status: AC
  Administered 2024-05-28: 25 mg via ORAL
  Filled 2024-05-28: qty 1

## 2024-05-28 MED ORDER — KETOROLAC TROMETHAMINE 60 MG/2ML IM SOLN
30.0000 mg | Freq: Once | INTRAMUSCULAR | Status: AC
  Administered 2024-05-28: 30 mg via INTRAMUSCULAR
  Filled 2024-05-28: qty 2

## 2024-05-28 MED ORDER — HYDROCODONE-ACETAMINOPHEN 5-325 MG PO TABS
1.0000 | ORAL_TABLET | Freq: Once | ORAL | Status: AC
  Administered 2024-05-28: 1 via ORAL
  Filled 2024-05-28: qty 1

## 2024-05-28 MED ORDER — IBUPROFEN 600 MG PO TABS: 600.0000 mg | ORAL_TABLET | Freq: Three times a day (TID) | ORAL | 0 refills | Status: AC | PRN

## 2024-05-28 NOTE — Discharge Instructions (Signed)
 Wear your air boot and use crutches as needed for comfort.   Use ice and elevation as much as possible for the next several days to help reduce the swelling.  Take the medications prescribed.  You may take the hydrocodone  prescribed for pain relief.  This will make you drowsy - do not drive within 4 hours of taking this medication.  Use the ibuprofen  also for inflammation.  Call your  doctor for a recheck if not improving over the next week.  You may benefit from physical therapy of your ankle if it is not getting better over the next week.

## 2024-05-28 NOTE — ED Triage Notes (Signed)
 While walking today, the patient accidentally stepped off the walkway into the grass. When this happened she scrapped her left ankle on the concrete and fell on her arms. She now complains of left ankle pain. She had surgery on this same ankle about 1.5 years ago. She did not experience dizziness or loss of consciousness. She is not on a blood thinner and did not hit her head    EMS vitals: 146/92 BP 70 HR 100% SPO2 on room air 100 CBG

## 2024-05-28 NOTE — ED Provider Notes (Signed)
 [place] [facility] DEPARTMENT AT [facility] Provider Note   CSN: [id number] Arrival date & time: 05/28/24  1756     Patient presents with: Fall and Ankle Pain   Stacy Deleon is a 43 y.o. female with a history including GERD, anxiety, surgical history significant for ORIF of the left ankle July 2023 presenting for evaluation of left ankle injury which occurred around 5 PM this evening.  She was walking on her sidewalk when she stepped off the edge which is raised above the grass level causing her to trip and hit her lateral ankle against the concrete as she fell.  She has severe pain at the site since this occurred.  She denies any other injuries, denies hitting her head.  She presents with an ice pack on her ankle.   The history is provided by the patient.       Prior to Admission medications   Medication Sig Start Date End Date Taking? Authorizing Provider  HYDROcodone -acetaminophen  (NORCO/VICODIN) 5-325 MG tablet Take 1 tablet by mouth every 6 (six) hours as needed for severe pain (pain score 7-10). 05/28/24  Yes Elimelech Houseman, PA-C  ibuprofen  (ADVIL ) 600 MG tablet Take 1 tablet (600 mg total) by mouth every 8 (eight) hours as needed. 05/28/24  Yes Miryam Mcelhinney, PA-C  escitalopram  (LEXAPRO ) 20 MG tablet Take 20 mg by mouth every morning. 04/30/19   [provider]  Galcanezumab -gnlm (EMGALITY , 300 MG DOSE,) 100 MG/ML SOSY Take 300 mg subcu at the onset of cluster period and monthly as needed Patient not taking: Reported on 03/26/2023 11/04/22   Sater, Charlie LABOR, MD  HYDROmorphone  (DILAUDID ) 2 MG tablet Take 1 tablet (2 mg total) by mouth every 6 (six) hours as needed for up to 18 doses for severe pain. 05/23/23   Cottie Donnice PARAS, MD  hydrOXYzine  (ATARAX ) 25 MG tablet Take 25 mg by mouth 3 (three) times daily as needed.    [provider]  metFORMIN  (GLUCOPHAGE -XR) 500 MG 24 hr tablet Take 1,000 mg by mouth every evening.    [provider]   promethazine  (PHENERGAN ) 25 MG tablet Take 1 tablet (25 mg total) by mouth every 6 (six) hours as needed for nausea or vomiting. 05/20/23   Dean Clarity, MD  Rimegepant Sulfate (NURTEC) 75 MG TBDP One po every other day 03/26/23   Sater, Charlie LABOR, MD  Semaglutide-Weight Management (WEGOVY) 0.5 MG/0.5ML SOAJ Inject 0.5 mg into the skin once a week.    [provider]  sertraline (ZOLOFT) 100 MG tablet Take 100 mg by mouth daily.    11/11/11  [provider]    Allergies: Codeine, Oxycodone , Other, and Metoclopramide     Review of Systems  Musculoskeletal:  Positive for arthralgias and joint swelling.  Skin:  Negative for wound.  Neurological:  Negative for weakness and numbness.  All other systems reviewed and are negative.   Updated Vital Signs BP (!) 106/55 (BP Location: Right Arm)   Pulse 71   Temp 98.5 F (36.9 C) (Oral)   Resp 18   LMP 04/18/2017   SpO2 99%   Physical Exam Vitals and nursing note reviewed.  Constitutional:      Appearance: She is well-developed.  HENT:     Head: Normocephalic.  Cardiovascular:     Rate and Rhythm: Normal rate.     Pulses: Normal pulses. No decreased pulses.          Dorsalis pedis pulses are 2+ on the right side and  2+ on the left side.       Posterior tibial pulses are 2+ on the right side and 2+ on the left side.  Musculoskeletal:        General: Swelling and tenderness present. No deformity.     Right ankle:     Right Achilles Tendon: Normal.     Left ankle: Swelling present. No ecchymosis. Tenderness present over the lateral malleolus and proximal fibula. No base of 5th metatarsal tenderness. Decreased range of motion.  Skin:    General: Skin is warm and dry.     Findings: No lesion.  Neurological:     Mental Status: She is alert.     Sensory: No sensory deficit.     (all labs ordered are listed, but only abnormal results are displayed) Labs Reviewed - No data to display  EKG: None  Radiology: DG  Ankle Complete Left Result Date: 05/28/2024 CLINICAL DATA:  Clemens, proximal fibular pain EXAM: LEFT ANKLE COMPLETE - 3+ VIEW; LEFT TIBIA AND FIBULA - 2 VIEW COMPARISON:  03/17/2022 FINDINGS: Left tibia/fibula: Frontal and lateral views are obtained. No acute displaced fracture. Alignment of the left knee and ankle is anatomic. Soft tissues are normal. Left ankle: Frontal, oblique, lateral views are obtained. Plate and screw fixation traverses the distal fibula. Surgical screw and anchor are seen along the distal tibiofibular syndesmosis. No evidence of orthopedic hardware failure or loosening. Alignment of the ankle mortise is anatomic. No acute displaced fracture. Soft tissues are unremarkable. IMPRESSION: 1. Postsurgical changes about the left ankle as above. 2. No acute bony abnormality. Electronically Signed   By: Ozell Daring M.D.   On: 05/28/2024 19:34   DG Tibia/Fibula Left Result Date: 05/28/2024 CLINICAL DATA:  Clemens, proximal fibular pain EXAM: LEFT ANKLE COMPLETE - 3+ VIEW; LEFT TIBIA AND FIBULA - 2 VIEW COMPARISON:  03/17/2022 FINDINGS: Left tibia/fibula: Frontal and lateral views are obtained. No acute displaced fracture. Alignment of the left knee and ankle is anatomic. Soft tissues are normal. Left ankle: Frontal, oblique, lateral views are obtained. Plate and screw fixation traverses the distal fibula. Surgical screw and anchor are seen along the distal tibiofibular syndesmosis. No evidence of orthopedic hardware failure or loosening. Alignment of the ankle mortise is anatomic. No acute displaced fracture. Soft tissues are unremarkable. IMPRESSION: 1. Postsurgical changes about the left ankle as above. 2. No acute bony abnormality. Electronically Signed   By: Ozell Daring M.D.   On: 05/28/2024 19:34     Procedures   Medications Ordered in the ED  hydrOXYzine  (ATARAX ) tablet 25 mg (25 mg Oral Given 05/28/24 1845)  HYDROcodone -acetaminophen  (NORCO/VICODIN) 5-325 MG per tablet 1 tablet (1  tablet Oral Given 05/28/24 1846)  ketorolac  (TORADOL ) injection 30 mg (30 mg Intramuscular Given 05/28/24 1941)                                    Medical Decision Making Patient presenting with an inversion type injury to the left ankle along with a direct blow to the lateral malleolus with a fall, her exam is reassuring with no obvious deformity.  There is concern for possible fracture however versus sprain/contusion.  Imaging is reassuring.  Her exam is also reassuring with no evidence for ligament instability.  She is neurovascularly intact.  She was placed in an ASO and given crutches, outlined home care instructions with follow-up with PCP for recheck if not improving over the  next week.  Amount and/or Complexity of Data Reviewed Radiology: ordered.    Details: Imaging reviewed, I agree with interpretation of no acute findings.  Risk Prescription drug management.        Final diagnoses:  Sprain of left ankle, unspecified ligament, initial encounter    ED Discharge Orders          Ordered    HYDROcodone -acetaminophen  (NORCO/VICODIN) 5-325 MG tablet  Every 6 hours PRN        05/28/24 2001    ibuprofen  (ADVIL ) 600 MG tablet  Every 8 hours PRN        05/28/24 2001               Antonette Hendricks, PA-C 05/28/24 2007    Patsey Lot, MD 05/28/24 2325
